# Patient Record
Sex: Female | Born: 1970 | Race: Black or African American | Hispanic: No | Smoking: Former smoker
Health system: Southern US, Community
[De-identification: ages and names within clinical notes are randomized; demographics above are authoritative.]

## PROBLEM LIST (undated history)

## (undated) DIAGNOSIS — E039 Hypothyroidism, unspecified: Secondary | ICD-10-CM

## (undated) DIAGNOSIS — G35 Multiple sclerosis: Secondary | ICD-10-CM

## (undated) DIAGNOSIS — D649 Anemia, unspecified: Secondary | ICD-10-CM

## (undated) DIAGNOSIS — I10 Essential (primary) hypertension: Secondary | ICD-10-CM

## (undated) DIAGNOSIS — N118 Other chronic tubulo-interstitial nephritis: Secondary | ICD-10-CM

## (undated) DIAGNOSIS — N151 Renal and perinephric abscess: Secondary | ICD-10-CM

## (undated) DIAGNOSIS — Z87442 Personal history of urinary calculi: Secondary | ICD-10-CM

## (undated) DIAGNOSIS — E119 Type 2 diabetes mellitus without complications: Secondary | ICD-10-CM

## (undated) HISTORY — PX: NO PAST SURGERIES: SHX2092

---

## 1997-05-31 ENCOUNTER — Emergency Department (HOSPITAL_COMMUNITY): Admission: EM | Admit: 1997-05-31 | Discharge: 1997-05-31 | Payer: Self-pay | Admitting: Emergency Medicine

## 1998-09-23 ENCOUNTER — Encounter: Payer: Self-pay | Admitting: Emergency Medicine

## 1998-09-23 ENCOUNTER — Emergency Department (HOSPITAL_COMMUNITY): Admission: EM | Admit: 1998-09-23 | Discharge: 1998-09-23 | Payer: Self-pay | Admitting: Emergency Medicine

## 1999-04-29 ENCOUNTER — Emergency Department (HOSPITAL_COMMUNITY): Admission: EM | Admit: 1999-04-29 | Discharge: 1999-04-29 | Payer: Self-pay | Admitting: Emergency Medicine

## 1999-04-29 ENCOUNTER — Encounter: Payer: Self-pay | Admitting: *Deleted

## 1999-08-23 ENCOUNTER — Emergency Department (HOSPITAL_COMMUNITY): Admission: EM | Admit: 1999-08-23 | Discharge: 1999-08-23 | Payer: Self-pay | Admitting: Emergency Medicine

## 1999-08-23 ENCOUNTER — Encounter: Payer: Self-pay | Admitting: Emergency Medicine

## 1999-11-18 ENCOUNTER — Emergency Department (HOSPITAL_COMMUNITY): Admission: EM | Admit: 1999-11-18 | Discharge: 1999-11-18 | Payer: Self-pay | Admitting: Emergency Medicine

## 1999-11-24 ENCOUNTER — Emergency Department (HOSPITAL_COMMUNITY): Admission: EM | Admit: 1999-11-24 | Discharge: 1999-11-24 | Payer: Self-pay | Admitting: Emergency Medicine

## 1999-12-18 ENCOUNTER — Emergency Department (HOSPITAL_COMMUNITY): Admission: EM | Admit: 1999-12-18 | Discharge: 1999-12-18 | Payer: Self-pay | Admitting: *Deleted

## 2000-08-29 ENCOUNTER — Encounter: Payer: Self-pay | Admitting: Obstetrics

## 2000-08-29 ENCOUNTER — Inpatient Hospital Stay (HOSPITAL_COMMUNITY): Admission: AD | Admit: 2000-08-29 | Discharge: 2000-08-29 | Payer: Self-pay | Admitting: Obstetrics

## 2000-09-17 ENCOUNTER — Inpatient Hospital Stay (HOSPITAL_COMMUNITY): Admission: AD | Admit: 2000-09-17 | Discharge: 2000-09-17 | Payer: Self-pay | Admitting: *Deleted

## 2000-10-13 ENCOUNTER — Inpatient Hospital Stay (HOSPITAL_COMMUNITY): Admission: AD | Admit: 2000-10-13 | Discharge: 2000-10-13 | Payer: Self-pay | Admitting: *Deleted

## 2000-11-24 ENCOUNTER — Ambulatory Visit (HOSPITAL_COMMUNITY): Admission: RE | Admit: 2000-11-24 | Discharge: 2000-11-24 | Payer: Self-pay | Admitting: Obstetrics & Gynecology

## 2001-02-24 ENCOUNTER — Inpatient Hospital Stay (HOSPITAL_COMMUNITY): Admission: AD | Admit: 2001-02-24 | Discharge: 2001-02-24 | Payer: Self-pay | Admitting: *Deleted

## 2001-04-11 ENCOUNTER — Encounter (HOSPITAL_COMMUNITY): Admission: RE | Admit: 2001-04-11 | Discharge: 2001-04-14 | Payer: Self-pay | Admitting: *Deleted

## 2001-04-17 ENCOUNTER — Encounter (INDEPENDENT_AMBULATORY_CARE_PROVIDER_SITE_OTHER): Payer: Self-pay

## 2001-04-17 ENCOUNTER — Inpatient Hospital Stay (HOSPITAL_COMMUNITY): Admission: AD | Admit: 2001-04-17 | Discharge: 2001-04-21 | Payer: Self-pay | Admitting: *Deleted

## 2001-05-05 ENCOUNTER — Encounter: Admission: RE | Admit: 2001-05-05 | Discharge: 2001-05-05 | Payer: Self-pay | Admitting: *Deleted

## 2001-05-22 ENCOUNTER — Ambulatory Visit (HOSPITAL_COMMUNITY): Admission: RE | Admit: 2001-05-22 | Discharge: 2001-05-22 | Payer: Self-pay | Admitting: *Deleted

## 2001-10-16 ENCOUNTER — Encounter: Payer: Self-pay | Admitting: Emergency Medicine

## 2001-10-16 ENCOUNTER — Emergency Department (HOSPITAL_COMMUNITY): Admission: EM | Admit: 2001-10-16 | Discharge: 2001-10-16 | Payer: Self-pay | Admitting: Emergency Medicine

## 2002-04-12 ENCOUNTER — Encounter: Admission: RE | Admit: 2002-04-12 | Discharge: 2002-07-11 | Payer: Self-pay | Admitting: Neurology

## 2002-12-12 ENCOUNTER — Encounter: Payer: Self-pay | Admitting: Cardiovascular Disease

## 2002-12-12 ENCOUNTER — Ambulatory Visit (HOSPITAL_COMMUNITY): Admission: RE | Admit: 2002-12-12 | Discharge: 2002-12-12 | Payer: Self-pay | Admitting: Neurology

## 2003-01-15 ENCOUNTER — Encounter (HOSPITAL_COMMUNITY): Admission: RE | Admit: 2003-01-15 | Discharge: 2003-04-15 | Payer: Self-pay | Admitting: Neurology

## 2003-02-14 ENCOUNTER — Encounter: Admission: RE | Admit: 2003-02-14 | Discharge: 2003-05-15 | Payer: Self-pay | Admitting: Neurology

## 2003-04-16 ENCOUNTER — Encounter (HOSPITAL_COMMUNITY): Admission: RE | Admit: 2003-04-16 | Discharge: 2003-07-15 | Payer: Self-pay | Admitting: Neurology

## 2003-07-11 ENCOUNTER — Ambulatory Visit (HOSPITAL_COMMUNITY): Admission: RE | Admit: 2003-07-11 | Discharge: 2003-07-11 | Payer: Self-pay | Admitting: Neurology

## 2003-07-11 ENCOUNTER — Encounter (INDEPENDENT_AMBULATORY_CARE_PROVIDER_SITE_OTHER): Payer: Self-pay | Admitting: Cardiology

## 2003-07-22 ENCOUNTER — Encounter (HOSPITAL_COMMUNITY): Admission: RE | Admit: 2003-07-22 | Discharge: 2003-08-05 | Payer: Self-pay | Admitting: Neurology

## 2003-09-11 ENCOUNTER — Encounter (HOSPITAL_COMMUNITY): Admission: RE | Admit: 2003-09-11 | Discharge: 2003-12-10 | Payer: Self-pay | Admitting: Neurology

## 2003-10-22 ENCOUNTER — Ambulatory Visit: Payer: Self-pay | Admitting: Cardiology

## 2004-01-27 ENCOUNTER — Encounter (HOSPITAL_COMMUNITY): Admission: RE | Admit: 2004-01-27 | Discharge: 2004-02-08 | Payer: Self-pay | Admitting: Neurology

## 2004-02-03 ENCOUNTER — Emergency Department (HOSPITAL_COMMUNITY): Admission: EM | Admit: 2004-02-03 | Discharge: 2004-02-04 | Payer: Self-pay | Admitting: Emergency Medicine

## 2004-02-12 ENCOUNTER — Encounter (HOSPITAL_COMMUNITY): Admission: RE | Admit: 2004-02-12 | Discharge: 2004-05-12 | Payer: Self-pay | Admitting: Neurology

## 2004-03-05 ENCOUNTER — Ambulatory Visit: Payer: Self-pay | Admitting: Internal Medicine

## 2004-04-23 ENCOUNTER — Ambulatory Visit: Payer: Self-pay

## 2004-04-28 ENCOUNTER — Ambulatory Visit: Payer: Self-pay | Admitting: Internal Medicine

## 2004-07-23 ENCOUNTER — Encounter (HOSPITAL_COMMUNITY): Admission: RE | Admit: 2004-07-23 | Discharge: 2004-10-21 | Payer: Self-pay | Admitting: Neurology

## 2004-07-28 ENCOUNTER — Ambulatory Visit: Payer: Self-pay | Admitting: Internal Medicine

## 2004-07-31 ENCOUNTER — Ambulatory Visit: Payer: Self-pay

## 2004-11-02 ENCOUNTER — Ambulatory Visit: Payer: Self-pay

## 2004-11-05 ENCOUNTER — Encounter (HOSPITAL_COMMUNITY): Admission: RE | Admit: 2004-11-05 | Discharge: 2005-02-03 | Payer: Self-pay | Admitting: Neurology

## 2005-02-10 ENCOUNTER — Ambulatory Visit: Payer: Self-pay

## 2005-02-10 ENCOUNTER — Encounter: Payer: Self-pay | Admitting: Cardiovascular Disease

## 2005-02-16 ENCOUNTER — Encounter (HOSPITAL_COMMUNITY): Admission: RE | Admit: 2005-02-16 | Discharge: 2005-05-17 | Payer: Self-pay | Admitting: Neurology

## 2005-04-28 ENCOUNTER — Ambulatory Visit: Payer: Self-pay | Admitting: Internal Medicine

## 2005-05-10 ENCOUNTER — Ambulatory Visit: Payer: Self-pay

## 2005-05-10 ENCOUNTER — Encounter: Payer: Self-pay | Admitting: Internal Medicine

## 2005-07-06 ENCOUNTER — Ambulatory Visit: Payer: Self-pay | Admitting: Internal Medicine

## 2005-08-10 ENCOUNTER — Ambulatory Visit: Payer: Self-pay

## 2005-08-10 ENCOUNTER — Encounter: Payer: Self-pay | Admitting: Cardiology

## 2005-08-17 ENCOUNTER — Encounter (HOSPITAL_COMMUNITY): Admission: RE | Admit: 2005-08-17 | Discharge: 2005-11-03 | Payer: Self-pay | Admitting: Neurology

## 2005-09-21 ENCOUNTER — Ambulatory Visit: Payer: Self-pay | Admitting: Internal Medicine

## 2005-11-16 ENCOUNTER — Encounter: Payer: Self-pay | Admitting: Cardiology

## 2005-11-16 ENCOUNTER — Ambulatory Visit: Payer: Self-pay

## 2005-11-22 ENCOUNTER — Encounter (HOSPITAL_COMMUNITY): Admission: RE | Admit: 2005-11-22 | Discharge: 2006-02-03 | Payer: Self-pay | Admitting: Neurology

## 2006-03-04 ENCOUNTER — Ambulatory Visit: Payer: Self-pay | Admitting: Internal Medicine

## 2008-03-29 ENCOUNTER — Ambulatory Visit (HOSPITAL_BASED_OUTPATIENT_CLINIC_OR_DEPARTMENT_OTHER): Admission: RE | Admit: 2008-03-29 | Discharge: 2008-03-29 | Payer: Self-pay | Admitting: Otolaryngology

## 2008-03-29 ENCOUNTER — Encounter (INDEPENDENT_AMBULATORY_CARE_PROVIDER_SITE_OTHER): Payer: Self-pay | Admitting: Otolaryngology

## 2009-01-27 ENCOUNTER — Emergency Department (HOSPITAL_COMMUNITY): Admission: EM | Admit: 2009-01-27 | Discharge: 2009-01-27 | Payer: Self-pay | Admitting: Emergency Medicine

## 2010-05-11 LAB — DIFFERENTIAL
Basophils Absolute: 0 10*3/uL (ref 0.0–0.1)
Lymphs Abs: 1.9 10*3/uL (ref 0.7–4.0)
Monocytes Absolute: 0.6 10*3/uL (ref 0.1–1.0)
Monocytes Relative: 6 % (ref 3–12)
Neutro Abs: 8.1 10*3/uL — ABNORMAL HIGH (ref 1.7–7.7)

## 2010-05-11 LAB — CBC
HCT: 28.9 % — ABNORMAL LOW (ref 36.0–46.0)
Hemoglobin: 8.7 g/dL — ABNORMAL LOW (ref 12.0–15.0)
MCHC: 30.3 g/dL (ref 30.0–36.0)
MCV: 65.4 fL — ABNORMAL LOW (ref 78.0–100.0)
Platelets: 253 10*3/uL (ref 150–400)
RBC: 4.42 MIL/uL (ref 3.87–5.11)
RDW: 22 % — ABNORMAL HIGH (ref 11.5–15.5)
WBC: 10.8 10*3/uL — ABNORMAL HIGH (ref 4.0–10.5)

## 2010-05-11 LAB — POCT I-STAT, CHEM 8
BUN: 11 mg/dL (ref 6–23)
Calcium, Ion: 1.18 mmol/L (ref 1.12–1.32)
Chloride: 106 mEq/L (ref 96–112)
Glucose, Bld: 112 mg/dL — ABNORMAL HIGH (ref 70–99)
HCT: 31 % — ABNORMAL LOW (ref 36.0–46.0)
TCO2: 24 mmol/L (ref 0–100)

## 2010-06-23 NOTE — Op Note (Signed)
NAMEOCEANE, FOSSE               ACCOUNT NO.:  [id number]   MEDICAL RECORD NO.:  [id number]          PATIENT TYPE:  AMB   LOCATION:  DSC                          FACILITY:  [facility]   PHYSICIAN:  Christopher E. Ezzard Standing, M.D.DATE OF BIRTH:  05/30/1970   DATE OF PROCEDURE:  03/29/2008  DATE OF DISCHARGE:                               OPERATIVE REPORT   PREOPERATIVE DIAGNOSIS:  Right tongue mass with recurrent bleeding.   POSTOPERATIVE DIAGNOSIS:  Right tongue mass with recurrent bleeding.   FINDINGS:  Consistent with a large pyogenic granuloma.   OPERATION PERFORMED:  Excision of right anterior tongue mass with simple  closure.   SURGEON:  Kristine Garbe. Ezzard Standing, MD   ANESTHESIA:  Local, Xylocaine with epinephrine.   COMPLICATIONS:  None.   BRIEF CLINICAL NOTE:  [place] Oborn is a 40 year old female with history  of a mass.  She has had intermittent biting of her tongue and has  developed a mass on the right anterior tongue.  It measures  approximately 2 cm in size and bleeds fairly easily and is consistent  with probable pyogenic granuloma.  Because she intermittently bites  this, she is taken to the operating room at this time for excision of  right anterior tongue mass.   DESCRIPTION OF PROCEDURE:  The patient was brought to the Minor Room.  The tongue was injected with 3 mL of Xylocaine with epinephrine for  local anesthetic.  The lesion was removed with scissors.  Silver nitrate  was used for hemostasis at the base and the small defect was closed with  2 interrupted 4-0 chromic sutures.  Specimen was sent to pathology.  The  patient as well tolerated this well and subsequently discharged home.   DISPOSITION:  Jennings is discharged home on Tylenol p.r.n. pain and  topical anesthetic for pain and we will have her follow up in my office  in 10-14 days for recheck.           ______________________________  Kristine Garbe. Ezzard Standing, M.D.     CEN/MEDQ  D:  03/29/2008  T:   03/29/2008  Job:  [id number]   cc:   Florentina Jenny, MD

## 2010-06-26 NOTE — Consult Note (Signed)
NAME:  Jacqueline Norman, DOWE                         ACCOUNT NO.:  [id number]   MEDICAL RECORD NO.:  [id number]                   PATIENT TYPE:  EMS   LOCATION:  MAJO                                 FACILITY:  [facility]   PHYSICIAN:  Catherine A. Orlin Hilding, M.D.          DATE OF BIRTH:  05/09/1970   DATE OF CONSULTATION:  DATE OF DISCHARGE:                                   CONSULTATION   CHIEF COMPLAINT:  Dizziness and dysequilibrium.   HISTORY OF PRESENT ILLNESS:  The patient is a 40 year old, right-handed  woman with a history of about three days' worth of intermittent mild  dizziness, dysequilibrium, unsteady walking, and she also complained of  intermittent diagonal diplopia.  There is no significant history of nausea  or vomiting.  She has some mild intermittent left-sided weakness, no  numbness.   REVIEW OF SYSTEMS:  Notable for headache.  She does have some urinary  urgency and fatigue.  No history of symptoms consistent with optic neuritis.  I do not get any history of symptoms of numbness from the waist down or the  chest down, to suggest transverse myelitis, but there is some urinary  urgency which could suggest that in the past.  No symptoms of INO or optic  neuritis in the past.   PAST MEDICAL HISTORY:  Significant for questionable borderline hypertension.  She says she has never had diabetes mellitus.  She has mild obesity.   SOCIAL HISTORY:  She has five children ranging in age from 58 years to 69 old, placing her at about age 17 at the birth of her first child.  She is unmarried.  Lives with her five children ages 56 years to five  months.  She works as a Lawyer.  She smokes 1/2 pack of cigarettes a day.  Occasional caffeine use.  No alcohol use.   MEDICATIONS:  She takes none routinely.   ALLERGIES:  No known drug allergies.   FAMILY HISTORY:  Positive for hypertension.  No history of multiple  sclerosis.   PHYSICAL EXAMINATION:  VITAL SIGNS:  Temperature 98.0  degrees, blood  pressure 120/78, pulse 64, respirations 18, 99% saturation.  HEENT:  Head normocephalic, atraumatic.  NECK:  Supple.  NEUROLOGIC:  Mental status:  She is awake and alert with normal language and  cognition.  Cranial nerve II:  Pupils equal, reactive, but could not see her  disc margins well.  Visual fields are full to confrontation.  Extraocular  movements are intact without nystagmus.  There is a slight dysconjugate  appearance, but I cannot elicit anything that looks like an intranuclear  ophthalmoplegia, and I cannot elicit any subjective diplopia from her.  Facial sensation is normal.  Facial motor activity is normal without  weakness or asymmetry.  Hearing is intact.  Palate is symmetric, and tongue  is midline.  There is no dysarthria.  On motor exam there is no drift.  She  has normal rapid fine movement, minimal satelliting of the left arm on arm  roll.  She, however, has good grips, good bulk, tone, and strength  throughout.  Her gait is mildly wide-based and slightly ataxic.  Her  reflexes, however, are 2+ and symmetric.  There is really not a lot of  asymmetry.  She has downgoing toes to plantar stimulation bilaterally.  Finger-to-nose and heel-to-shin are normal.  She is ataxic and somewhat  wobbly on tandem gait.   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  No lesions  enhanced on the administration of Gadolinium, however, and nothing looks  hyperacute.  A CBC is 100.  A urine pregnancy test is negative.   IMPRESSION:  Multiple sclerosis, clinically probable with laboratory  support.  Looks fairly advanced by imaging, although she has a relatively  benign exam, considering the burden of disease on the scan.   PLAN:  1. Prednisone double strength 10 mg, a 12-day Dosepak.  2. Pepcid 20 mg  b.i.d. for the duration of the steroids.  3. Get her a cane for ambulation.  4. Keep her out of work for at least one week, until she feels like she is     walking steadily.  5. Will follow up in the office in about two weeks, with my physician's     assistant, Dorris Singh, for further education and discussion of an     immunomodulatory medication and further treatment of her multiple     sclerosis.                                               Catherine A. Orlin Hilding, M.D.    CAW/MEDQ  D:  10/16/2001  T:  10/17/2001  Job:  [id number] [phone]

## 2010-06-26 NOTE — Op Note (Signed)
[facility]  Patient:    Jacqueline Norman, Jacqueline Norman Visit Number: [id number] MRN: [id number]          Service Type: DSU Location: [facility] Attending Physician:  Enid Cutter Dictated by:   Shearon Balo, M.D. Proc. Date: 05/22/01 Admit Date:  05/22/2001                             Operative Report  PREOPERATIVE DIAGNOSIS:       A 40 year old G5, P5 black female with undesired fertility.  POSTOPERATIVE DIAGNOSIS:      A 40 year old G5, P5 black female with undesired fertility.  PROCEDURE:                    Laparoscopic bilateral tubal ligation, with _____________.  SURGEON:                      Shearon Balo, M.D.  ANESTHESIA:                   General endotracheal.  COMPLICATIONS:                None.  SPECIMENS:                    None.  ESTIMATED BLOOD LOSS:         Minimal.  DISPOSITION:                  To recovery room, stable.  FINDINGS:                     Normal uterus, tubes and ovaries.  DESCRIPTION OF PROCEDURE:     The patient was taken to the operating room, where she was placed under general endotracheal anesthesia.  She was placed in the Allens stirrups and prepped and draped in sterile fashion.  A speculum was placed in the vagina and a Hulka tenaculum was introduced into the uterine cavity.  The attention was then turned to the abdomen, where an incision was made to the umbilicus.  A Veress needle was inserted and the abdomen was insufflated with approximately 300 L of CO2 gas.  A umbilical 5 mm trocar was introduced and visualization of the pelvic cavity was performed.  A second 8 mm suprapubic incision was performed, and an 8 mm trocar introduced. _____________ applicator was then introduced into the abdominal cavity and the patients right fallopian tube was identified to the fimbriated end.  The _____________ was applied approximately 3 cm for the cornua.  Appropriate splitting and blanching of the tube was noted.  The patients left  fallopian tube was identified in a similar fashion.  Again, a _____________ was applied without difficulty, about 2-3 cm from the cornua.  Both tubes were hemostatic. The trocars were removed under direct visualization and CO2 gas was emptied from the abdominal cavity.  The umbilical and suprapubic incisions were reapproximated with a subcuticular stitch of 3-0 Vicryl.  The patient tolerated the procedure well and was awakened and returned to the recovery room in stable condition. Dictated by:   Shearon Balo, M.D. Attending Physician:  Enid Cutter DD:  05/22/01 TD:  05/22/01 Job: [id number]

## 2010-06-26 NOTE — Op Note (Signed)
[facility]  Patient:    Jacqueline Norman, Jacqueline Norman Visit Number: [id number] MRN: [id number]          Service Type: OBS Location: 910A 9119 01 Attending Physician:  Enid Cutter Dictated by:   Conni Elliot, M.D. Admit Date:  04/17/2001                             Operative Report  PREOPERATIVE DIAGNOSIS:       Retained placenta with hemorrhage.  POSTOPERATIVE DIAGNOSIS:      Retained placenta with hemorrhage.  SURGEON:                      Conni Elliot, M.D.  DESCRIPTION OF PROCEDURE:     The patient was taken to the operating room where a spinal anesthetic was placed.  Upon placing the patient supine in the dorsolithotomy position, the placenta had spontaneously delivered down to the introitus.  The uterus was explored manually and was found to have no further retained products, and the lower segment was intact.  The estimated blood loss was less than 200 cc.  Needle, instrument, and sponge counts were correct. Dictated by:   Conni Elliot, M.D. Attending Physician:  Enid Cutter DD:  04/19/01 TD:  04/19/01 Job: [id number]

## 2010-06-26 NOTE — H&P (Signed)
[facility]  Patient:    Jacqueline Norman, Jacqueline Norman Visit Number: [id number] MRN: [id number]          Service Type: OBS Location: 910A 9119 01 Attending Physician:  Enid Cutter Dictated by:   Shearon Balo, M.D. Admit Date:  04/17/2001 Discharge Date: 04/21/2001   CC:         [place]   History and Physical  HISTORY OF PRESENT ILLNESS:   The patient is a 40 year old, G5, P5, black female, status post spontaneous vaginal delivery on April 19, 2001.  She has undesired fertility and presents to the clinic today for counseling on tubal ligation.  The patient was counseled on alternative forms of conception, including barrier, IUD, and hormonal contraception.  The patient says that she desires permanent sterilization.  The risks of surgery were discussed with the patient, including the risk of bleeding, infection, and injury to internal organs.  The risk of failure of 1:200 and the increased risk of ectopic gestation were discussed as well.  The patient understands these risks and wishes to proceed.  PAST MEDICAL HISTORY:         The patient has a history of low back pain.  PAST SURGICAL HISTORY:        She has had C-section x 1 for fetal distress.  FAMILY HISTORY:               Her father is deceased of throat cancer, otherwise noncontributory.  MEDICATIONS:                  Ibuprofen.  ALLERGIES:                    No known drug allergies.  PHYSICAL EXAMINATION:         The pulse is 59 and blood pressure 149/85.  WEIGHT:                       238 pounds.  HEENT:                        Normocephalic and atraumatic.  HEART:                        Regular rate and rhythm without murmurs, rubs, or gallops.  LUNGS:                        Clear to auscultation bilaterally.  ABDOMEN:                      Soft, nondistended, and moderately obese.  There is a well-healed Pfannenstiel C-section incision.  No  hepatosplenomegaly is noted.  No other abdominal masses noted.  PELVIC:                       Bimanual exam reveals approximately 12-week size uterus and nontender.  No adnexal masses were noted.  EXTREMITIES:                  Without clubbing, cyanosis, or edema.  ASSESSMENT AND PLAN:          The patient is a 40 year old, G5, P5, black female, status post spontaneous vaginal delivery on April 19, 2001, who desires permanent sterilization.  The patient is to be scheduled for interval tubal with laparoscopic tubal ligation by  Falope rings.  We will schedule this in mid April prior to the expiration of the patients Medicaid benefits. Dictated by:   Shearon Balo, M.D. Attending Physician:  Enid Cutter DD:  05/05/01 TD:  05/05/01 Job: [id number]

## 2013-07-29 ENCOUNTER — Encounter (HOSPITAL_COMMUNITY): Payer: Self-pay | Admitting: Emergency Medicine

## 2013-07-29 ENCOUNTER — Emergency Department (HOSPITAL_COMMUNITY): Payer: Medicare Other

## 2013-07-29 ENCOUNTER — Inpatient Hospital Stay (HOSPITAL_COMMUNITY)
Admission: EM | Admit: 2013-07-29 | Discharge: 2013-08-05 | DRG: 871 | Disposition: A | Discharge status: Home Health | Payer: Medicare Other | Attending: Internal Medicine | Admitting: Internal Medicine

## 2013-07-29 DIAGNOSIS — G35 Multiple sclerosis: Secondary | ICD-10-CM | POA: Diagnosis present

## 2013-07-29 DIAGNOSIS — K6812 Psoas muscle abscess: Secondary | ICD-10-CM | POA: Diagnosis present

## 2013-07-29 DIAGNOSIS — E876 Hypokalemia: Secondary | ICD-10-CM | POA: Diagnosis present

## 2013-07-29 DIAGNOSIS — R197 Diarrhea, unspecified: Secondary | ICD-10-CM | POA: Diagnosis not present

## 2013-07-29 DIAGNOSIS — N1330 Unspecified hydronephrosis: Secondary | ICD-10-CM | POA: Diagnosis present

## 2013-07-29 DIAGNOSIS — D509 Iron deficiency anemia, unspecified: Secondary | ICD-10-CM | POA: Diagnosis present

## 2013-07-29 DIAGNOSIS — N201 Calculus of ureter: Secondary | ICD-10-CM | POA: Diagnosis present

## 2013-07-29 DIAGNOSIS — A419 Sepsis, unspecified organism: Principal | ICD-10-CM | POA: Diagnosis present

## 2013-07-29 DIAGNOSIS — G8250 Quadriplegia, unspecified: Secondary | ICD-10-CM | POA: Diagnosis present

## 2013-07-29 DIAGNOSIS — Z79899 Other long term (current) drug therapy: Secondary | ICD-10-CM

## 2013-07-29 DIAGNOSIS — N200 Calculus of kidney: Secondary | ICD-10-CM | POA: Diagnosis present

## 2013-07-29 DIAGNOSIS — N118 Other chronic tubulo-interstitial nephritis: Secondary | ICD-10-CM | POA: Diagnosis present

## 2013-07-29 DIAGNOSIS — N12 Tubulo-interstitial nephritis, not specified as acute or chronic: Secondary | ICD-10-CM | POA: Diagnosis present

## 2013-07-29 DIAGNOSIS — Z7401 Bed confinement status: Secondary | ICD-10-CM

## 2013-07-29 DIAGNOSIS — Z87891 Personal history of nicotine dependence: Secondary | ICD-10-CM

## 2013-07-29 DIAGNOSIS — K5900 Constipation, unspecified: Secondary | ICD-10-CM | POA: Diagnosis present

## 2013-07-29 DIAGNOSIS — N151 Renal and perinephric abscess: Secondary | ICD-10-CM | POA: Diagnosis present

## 2013-07-29 DIAGNOSIS — N119 Chronic tubulo-interstitial nephritis, unspecified: Secondary | ICD-10-CM | POA: Diagnosis present

## 2013-07-29 HISTORY — DX: Multiple sclerosis: G35

## 2013-07-29 LAB — URINALYSIS, ROUTINE W REFLEX MICROSCOPIC
Glucose, UA: NEGATIVE mg/dL
Ketones, ur: NEGATIVE mg/dL
Nitrite: POSITIVE — AB
PROTEIN: 100 mg/dL — AB
Specific Gravity, Urine: 1.026 (ref 1.005–1.030)
UROBILINOGEN UA: 1 mg/dL (ref 0.0–1.0)
pH: 5 (ref 5.0–8.0)

## 2013-07-29 LAB — CBC WITH DIFFERENTIAL/PLATELET
BASOS PCT: 0 % (ref 0–1)
Basophils Absolute: 0 10*3/uL (ref 0.0–0.1)
EOS PCT: 2 % (ref 0–5)
Eosinophils Absolute: 0.2 10*3/uL (ref 0.0–0.7)
HCT: 26.2 % — ABNORMAL LOW (ref 36.0–46.0)
HEMOGLOBIN: 7.5 g/dL — AB (ref 12.0–15.0)
LYMPHS PCT: 27 % (ref 12–46)
Lymphs Abs: 2.7 10*3/uL (ref 0.7–4.0)
MCH: 19.7 pg — AB (ref 26.0–34.0)
MCHC: 28.6 g/dL — ABNORMAL LOW (ref 30.0–36.0)
MCV: 68.9 fL — ABNORMAL LOW (ref 78.0–100.0)
MONO ABS: 0.8 10*3/uL (ref 0.1–1.0)
MONOS PCT: 8 % (ref 3–12)
NEUTROS PCT: 63 % (ref 43–77)
Neutro Abs: 6.3 10*3/uL (ref 1.7–7.7)
Platelets: 429 10*3/uL — ABNORMAL HIGH (ref 150–400)
RBC: 3.8 MIL/uL — AB (ref 3.87–5.11)
RDW: 19.5 % — ABNORMAL HIGH (ref 11.5–15.5)
WBC: 10 10*3/uL (ref 4.0–10.5)

## 2013-07-29 LAB — COMPREHENSIVE METABOLIC PANEL
ALK PHOS: 83 U/L (ref 39–117)
ALT: 9 U/L (ref 0–35)
AST: 20 U/L (ref 0–37)
Albumin: 2.1 g/dL — ABNORMAL LOW (ref 3.5–5.2)
BILIRUBIN TOTAL: 0.6 mg/dL (ref 0.3–1.2)
BUN: 9 mg/dL (ref 6–23)
CHLORIDE: 97 meq/L (ref 96–112)
CO2: 25 meq/L (ref 19–32)
Calcium: 9 mg/dL (ref 8.4–10.5)
Creatinine, Ser: 0.63 mg/dL (ref 0.50–1.10)
GLUCOSE: 118 mg/dL — AB (ref 70–99)
POTASSIUM: 3.4 meq/L — AB (ref 3.7–5.3)
SODIUM: 134 meq/L — AB (ref 137–147)
Total Protein: 9.4 g/dL — ABNORMAL HIGH (ref 6.0–8.3)

## 2013-07-29 LAB — PREGNANCY, URINE: Preg Test, Ur: NEGATIVE

## 2013-07-29 LAB — APTT: APTT: 26 s (ref 24–37)

## 2013-07-29 LAB — PROTIME-INR
INR: 1.22 (ref 0.00–1.49)
PROTHROMBIN TIME: 15.1 s (ref 11.6–15.2)

## 2013-07-29 LAB — URINE MICROSCOPIC-ADD ON

## 2013-07-29 LAB — LIPASE, BLOOD: Lipase: 24 U/L (ref 11–59)

## 2013-07-29 LAB — LACTATE DEHYDROGENASE: LDH: 157 U/L (ref 94–250)

## 2013-07-29 LAB — RETICULOCYTES
RBC.: 4.25 MIL/uL (ref 3.87–5.11)
RETIC COUNT ABSOLUTE: 76.5 10*3/uL (ref 19.0–186.0)
Retic Ct Pct: 1.8 % (ref 0.4–3.1)

## 2013-07-29 LAB — ABO/RH: ABO/RH(D): O POS

## 2013-07-29 MED ORDER — MORPHINE SULFATE 2 MG/ML IJ SOLN
2.0000 mg | INTRAMUSCULAR | Status: DC | PRN
  Administered 2013-07-30 (×3): 2 mg via INTRAVENOUS
  Filled 2013-07-29 (×3): qty 1

## 2013-07-29 MED ORDER — IOHEXOL 300 MG/ML  SOLN
50.0000 mL | Freq: Once | INTRAMUSCULAR | Status: AC | PRN
  Administered 2013-07-29: 50 mL via ORAL

## 2013-07-29 MED ORDER — ACETAMINOPHEN 650 MG RE SUPP
650.0000 mg | Freq: Once | RECTAL | Status: AC
  Administered 2013-07-29: 650 mg via RECTAL
  Filled 2013-07-29: qty 1

## 2013-07-29 MED ORDER — ONDANSETRON HCL 4 MG/2ML IJ SOLN
4.0000 mg | Freq: Once | INTRAMUSCULAR | Status: AC
  Administered 2013-07-29: 4 mg via INTRAVENOUS
  Filled 2013-07-29: qty 2

## 2013-07-29 MED ORDER — POTASSIUM CHLORIDE 10 MEQ/100ML IV SOLN
10.0000 meq | INTRAVENOUS | Status: AC
  Administered 2013-07-29 (×2): 10 meq via INTRAVENOUS
  Filled 2013-07-29 (×2): qty 100

## 2013-07-29 MED ORDER — SODIUM CHLORIDE 0.9 % IV SOLN
INTRAVENOUS | Status: DC
  Administered 2013-07-30 – 2013-08-05 (×11): via INTRAVENOUS

## 2013-07-29 MED ORDER — SODIUM CHLORIDE 0.9 % IV BOLUS (SEPSIS)
500.0000 mL | Freq: Once | INTRAVENOUS | Status: AC
  Administered 2013-07-29: 500 mL via INTRAVENOUS

## 2013-07-29 MED ORDER — LEVOTHYROXINE SODIUM 50 MCG PO TABS
50.0000 ug | ORAL_TABLET | Freq: Every day | ORAL | Status: DC
  Administered 2013-07-31 – 2013-08-05 (×6): 50 ug via ORAL
  Filled 2013-07-29 (×7): qty 1

## 2013-07-29 MED ORDER — VANCOMYCIN HCL IN DEXTROSE 750-5 MG/150ML-% IV SOLN
750.0000 mg | Freq: Three times a day (TID) | INTRAVENOUS | Status: DC
  Administered 2013-07-30 – 2013-07-31 (×5): 750 mg via INTRAVENOUS
  Filled 2013-07-29 (×6): qty 150

## 2013-07-29 MED ORDER — SODIUM CHLORIDE 0.9 % IV BOLUS (SEPSIS)
1000.0000 mL | Freq: Once | INTRAVENOUS | Status: AC
  Administered 2013-07-29: 1000 mL via INTRAVENOUS

## 2013-07-29 MED ORDER — IOHEXOL 300 MG/ML  SOLN
100.0000 mL | Freq: Once | INTRAMUSCULAR | Status: AC | PRN
  Administered 2013-07-29: 100 mL via INTRAVENOUS

## 2013-07-29 MED ORDER — BENZTROPINE MESYLATE 1 MG PO TABS
1.0000 mg | ORAL_TABLET | Freq: Two times a day (BID) | ORAL | Status: DC
  Administered 2013-07-30 – 2013-08-05 (×13): 1 mg via ORAL
  Filled 2013-07-29: qty 2
  Filled 2013-07-29: qty 1
  Filled 2013-07-29: qty 2
  Filled 2013-07-29 (×5): qty 1
  Filled 2013-07-29: qty 2
  Filled 2013-07-29: qty 1
  Filled 2013-07-29: qty 2
  Filled 2013-07-29: qty 1
  Filled 2013-07-29: qty 2
  Filled 2013-07-29: qty 1

## 2013-07-29 MED ORDER — DOCUSATE SODIUM 100 MG PO CAPS: 100.0000 mg | ORAL_CAPSULE | Freq: Every day | ORAL | Status: DC | PRN

## 2013-07-29 MED ORDER — ONDANSETRON HCL 4 MG/2ML IJ SOLN
4.0000 mg | Freq: Four times a day (QID) | INTRAMUSCULAR | Status: DC | PRN
  Administered 2013-08-01 – 2013-08-02 (×2): 4 mg via INTRAVENOUS
  Filled 2013-07-29 (×2): qty 2

## 2013-07-29 MED ORDER — VANCOMYCIN HCL IN DEXTROSE 1-5 GM/200ML-% IV SOLN
1000.0000 mg | INTRAVENOUS | Status: AC
  Administered 2013-07-29: 1000 mg via INTRAVENOUS
  Filled 2013-07-29 (×2): qty 200

## 2013-07-29 MED ORDER — POTASSIUM CHLORIDE 10 MEQ/100ML IV SOLN
10.0000 meq | INTRAVENOUS | Status: DC
  Administered 2013-07-29 (×2): 10 meq via INTRAVENOUS
  Filled 2013-07-29 (×2): qty 100

## 2013-07-29 MED ORDER — SODIUM CHLORIDE 0.9 % IV SOLN
INTRAVENOUS | Status: AC
  Administered 2013-07-29: 125 mL/h via INTRAVENOUS

## 2013-07-29 MED ORDER — PIPERACILLIN-TAZOBACTAM 3.375 G IVPB
3.3750 g | Freq: Three times a day (TID) | INTRAVENOUS | Status: DC
  Administered 2013-07-30 – 2013-08-04 (×17): 3.375 g via INTRAVENOUS
  Filled 2013-07-29 (×20): qty 50

## 2013-07-29 MED ORDER — HYDROMORPHONE HCL PF 1 MG/ML IJ SOLN
1.0000 mg | Freq: Once | INTRAMUSCULAR | Status: AC
  Administered 2013-07-29: 1 mg via INTRAVENOUS
  Filled 2013-07-29: qty 1

## 2013-07-29 MED ORDER — BACLOFEN 10 MG PO TABS
10.0000 mg | ORAL_TABLET | Freq: Two times a day (BID) | ORAL | Status: DC
  Administered 2013-07-29 – 2013-08-05 (×14): 10 mg via ORAL
  Filled 2013-07-29 (×15): qty 1

## 2013-07-29 MED ORDER — SODIUM CHLORIDE 0.9 % IJ SOLN
3.0000 mL | Freq: Two times a day (BID) | INTRAMUSCULAR | Status: DC
  Administered 2013-07-30 – 2013-08-03 (×7): 3 mL via INTRAVENOUS

## 2013-07-29 MED ORDER — PIPERACILLIN-TAZOBACTAM 3.375 G IVPB 30 MIN
3.3750 g | Freq: Once | INTRAVENOUS | Status: AC
  Administered 2013-07-29: 3.375 g via INTRAVENOUS
  Filled 2013-07-29: qty 50

## 2013-07-29 MED ORDER — DEXTROSE 5 % IV SOLN
1.0000 g | Freq: Once | INTRAVENOUS | Status: AC
  Administered 2013-07-29: 1 g via INTRAVENOUS
  Filled 2013-07-29: qty 10

## 2013-07-29 MED ORDER — ONDANSETRON HCL 4 MG PO TABS: 4.0000 mg | ORAL_TABLET | Freq: Four times a day (QID) | ORAL | Status: DC | PRN

## 2013-07-29 NOTE — Consult Note (Signed)
Urology Consult  Referring physician:Dr Regenia Skeeter Reason for referral: Right nephrolithiasis  Chief Complaint: Abdominal pain  History of Present Illness: Patient is a 43 years old female who presented to the emergency room this afternoon with 5 days history of increasing abdominal pain. She had one episode of vomiting about 3 days ago. She has a history of constipation and fecal impaction. She has a 12 years history of MS. She gets enemas as needed by her mother.  She states that she doesn't have any problems urinating. Urinalysis shows a 21-50 WBCs, many bacteria, calcium oxalate crystals, nitrite positive. Her white count is 10,000. Hemoglobin is 7.5 and hematocrit is 26.2. BUN is 9, creatinine 0.63. CT scan showed multiple right renal calculi and a 23 x 11 mm stone at the right UPJ with severe hydronephrosis. There is also a fluid collection in the perinephric area. Her blood pressure is 126/95. She did not have a temperature at home. Her temperature here is 102.1. She is now on IV Zosyn.  Past Medical History  Diagnosis Date  . MS (multiple sclerosis)    History reviewed. No pertinent past surgical history.  Medications: Baclofen, benztropine, vitamin D, Colace, interferon beta-1B, levothyroxine, omega-3 acid ethyl esters, potassium chloride. Allergies: No Known Allergies  No family history on file. Social History:  reports that she has quit smoking. She does not have any smokeless tobacco history on file. She reports that she does not drink alcohol or use illicit drugs.  ROS: All systems are reviewed and negative except as noted.   Physical Exam:  Vital signs in last 24 hours: Temp:  [98.5 F (36.9 C)-102.1 F (38.9 C)] 102.1 F (38.9 C) (06/21 1950) Pulse Rate:  [107-113] 113 (06/21 1950) Resp:  [17] 17 (06/21 1950) BP: (126-128)/(78-95) 126/95 mmHg (06/21 1950) SpO2:  [94 %-98 %] 94 % (06/21 1950) HEENT: Normal Cardiovascular: Skin warm; not flushed Respiratory: Breaths  quiet; no shortness of breath Abdomen: No masses Neurological: Decreased sensation extremities. Unable to move extremities.  Musculoskeletal: Normal motor function arms and legs Lymphatics: No inguinal adenopathy Skin: No rashes.  Warm and dry Genitourinary: No CVA tenderness.  Generalized abdominal tenderness.  No mass. Extremities: She has no lower extremities edema. She is unable to move her extremities.  Laboratory Data:  Results for orders placed during the hospital encounter of 07/29/13 (from the past 72 hour(s))  CBC WITH DIFFERENTIAL     Status: Abnormal   Collection Time    07/29/13  4:23 PM      Result Value Ref Range   WBC 10.0  4.0 - 10.5 K/uL   RBC 3.80 (*) 3.87 - 5.11 MIL/uL   Hemoglobin 7.5 (*) 12.0 - 15.0 g/dL   HCT 26.2 (*) 36.0 - 46.0 %   MCV 68.9 (*) 78.0 - 100.0 fL   MCH 19.7 (*) 26.0 - 34.0 pg   MCHC 28.6 (*) 30.0 - 36.0 g/dL   RDW 19.5 (*) 11.5 - 15.5 %   Platelets 429 (*) 150 - 400 K/uL   Neutrophils Relative % 63  43 - 77 %   Lymphocytes Relative 27  12 - 46 %   Monocytes Relative 8  3 - 12 %   Eosinophils Relative 2  0 - 5 %   Basophils Relative 0  0 - 1 %   Neutro Abs 6.3  1.7 - 7.7 K/uL   Lymphs Abs 2.7  0.7 - 4.0 K/uL   Monocytes Absolute 0.8  0.1 - 1.0 K/uL  Eosinophils Absolute 0.2  0.0 - 0.7 K/uL   Basophils Absolute 0.0  0.0 - 0.1 K/uL   Smear Review MORPHOLOGY UNREMARKABLE    COMPREHENSIVE METABOLIC PANEL     Status: Abnormal   Collection Time    07/29/13  4:23 PM      Result Value Ref Range   Sodium 134 (*) 137 - 147 mEq/L   Potassium 3.4 (*) 3.7 - 5.3 mEq/L   Chloride 97  96 - 112 mEq/L   CO2 25  19 - 32 mEq/L   Glucose, Bld 118 (*) 70 - 99 mg/dL   BUN 9  6 - 23 mg/dL   Creatinine, Ser 0.63  0.50 - 1.10 mg/dL   Calcium 9.0  8.4 - 10.5 mg/dL   Total Protein 9.4 (*) 6.0 - 8.3 g/dL   Albumin 2.1 (*) 3.5 - 5.2 g/dL   AST 20  0 - 37 U/L   ALT 9  0 - 35 U/L   Alkaline Phosphatase 83  39 - 117 U/L   Total Bilirubin 0.6  0.3 - 1.2 mg/dL    GFR calc non Af Amer >90  >90 mL/min   GFR calc Af Amer >90  >90 mL/min   Comment: (NOTE)     The eGFR has been calculated using the CKD EPI equation.     This calculation has not been validated in all clinical situations.     eGFR's persistently <90 mL/min signify possible Chronic Kidney     Disease.  LIPASE, BLOOD     Status: None   Collection Time    07/29/13  4:23 PM      Result Value Ref Range   Lipase 24  11 - 59 U/L  URINALYSIS, ROUTINE W REFLEX MICROSCOPIC     Status: Abnormal   Collection Time    07/29/13  4:54 PM      Result Value Ref Range   Color, Urine ORANGE (*) YELLOW   Comment: BIOCHEMICALS MAY BE AFFECTED BY COLOR   APPearance CLOUDY (*) CLEAR   Specific Gravity, Urine 1.026  1.005 - 1.030   pH 5.0  5.0 - 8.0   Glucose, UA NEGATIVE  NEGATIVE mg/dL   Hgb urine dipstick MODERATE (*) NEGATIVE   Bilirubin Urine SMALL (*) NEGATIVE   Ketones, ur NEGATIVE  NEGATIVE mg/dL   Protein, ur 100 (*) NEGATIVE mg/dL   Urobilinogen, UA 1.0  0.0 - 1.0 mg/dL   Nitrite POSITIVE (*) NEGATIVE   Leukocytes, UA MODERATE (*) NEGATIVE  PREGNANCY, URINE     Status: None   Collection Time    07/29/13  4:54 PM      Result Value Ref Range   Preg Test, Ur NEGATIVE  NEGATIVE   Comment:            THE SENSITIVITY OF THIS     METHODOLOGY IS >20 mIU/mL.  URINE MICROSCOPIC-ADD ON     Status: Abnormal   Collection Time    07/29/13  4:54 PM      Result Value Ref Range   Squamous Epithelial / LPF RARE  RARE   WBC, UA 21-50  <3 WBC/hpf   Bacteria, UA MANY (*) RARE   Casts GRANULAR CAST (*) NEGATIVE   Crystals CA OXALATE CRYSTALS (*) NEGATIVE   Urine-Other MUCOUS PRESENT     No results found for this or any previous visit (from the past 240 hour(s)). 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 Right perinephric fluid collection.  MS. Right pyelonephritis. XGP is in the differential diagnosis.  Plan:  I reviewed the CT scan with Dr. Jacqulynn Cadet. 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 I do not think she would benefit from a double-J stent placement either. She needs percutaneous drainage of the perinephric fluid collection. Continue IV antibiotics. She will need a renal scan for differential renal function. If she has minimal renal function on the right side probably need a nephrectomy.  We will follow the patient with you.  CC: Dr. Legrand Pitts, Charlene Brooke 07/29/2013, 9:29 PM

## 2013-07-29 NOTE — ED Notes (Signed)
Pt is NPO per Dr. Criss Alvine

## 2013-07-29 NOTE — ED Notes (Signed)
Pt transported to CT ?

## 2013-07-29 NOTE — ED Notes (Signed)
Bed: OB09 Expected date:  Expected time:  Means of arrival:  Comments: EMS/M.S./abd. pain

## 2013-07-29 NOTE — Progress Notes (Signed)
ANTIBIOTIC CONSULT NOTE - INITIAL  Pharmacy Consult for Vancomycin/Zosyn Indication: Sepsis/Intra-abdominal infection  No Known Allergies  Patient Measurements: Height: 5\' 5"  (165.1 cm) Weight: 174 lb 2.6 oz (79 kg) IBW/kg (Calculated) : 57  Vital Signs: Temp: 99.5 F (37.5 C) (06/21 2200) Temp src: Rectal (06/21 2200) BP: 106/70 mmHg (06/21 2200) Pulse Rate: 105 (06/21 2200) Intake/Output from previous day:   Intake/Output from this shift:    Labs:  Recent Labs  07/29/13 1623  WBC 10.0  HGB 7.5*  PLT 429*  CREATININE 0.63   Estimated Creatinine Clearance: 95.2 ml/min (by C-G formula based on Cr of 0.63). No results found for this basename: VANCOTROUGH, VANCOPEAK, VANCORANDOM, GENTTROUGH, GENTPEAK, GENTRANDOM, TOBRATROUGH, TOBRAPEAK, TOBRARND, AMIKACINPEAK, AMIKACINTROU, AMIKACIN,  in the last 72 hours   Microbiology: No results found for this or any previous visit (from the past 720 hour(s)).  Medical History: Past Medical History  Diagnosis Date  . MS (multiple sclerosis)     Medications:  Scheduled:  . sodium chloride   Intravenous STAT  . baclofen  10 mg Oral BID  . [START ON 07/30/2013] benztropine  1 mg Oral BID  . [START ON 07/30/2013] levothyroxine  50 mcg Oral QAC breakfast  . [START ON 07/30/2013] piperacillin-tazobactam (ZOSYN)  IV  3.375 g Intravenous Q8H  . potassium chloride  10 mEq Intravenous Q1 Hr x 2  . sodium chloride  3 mL Intravenous Q12H  . vancomycin  1,000 mg Intravenous STAT  . [START ON 07/30/2013] vancomycin  750 mg Intravenous Q8H   Infusions:  . sodium chloride 100 mL/hr (07/29/13 2217)   Assessment:  43 yr female with h/o MS.  CTAbd shows kidney stone, chronic pyelonephritis, psoas abscess and concerned of perinephric abscess.  Urology consuled   Urine and blood cultures ordered  Zosyn 3.375gm given @ 20:59 and Vancomycin 1gm given @ 23:00  Pharmacy consulted to continue dosing of Zosyn and Vancomycin for  Sepsis/Intra-abdominal infection  Goal of Therapy:  Vancomycin trough level 15-20 mcg/ml  Plan:  Measure antibiotic drug levels at steady state Follow up culture results Zosyn 3.375gm IV q8h (each dose infused over 4 hrs) Vancomycin 750mg  IV q8h  , Joselyn Glassman, PharmD 07/29/2013,11:28 PM

## 2013-07-29 NOTE — ED Notes (Signed)
Per EMS pt has been having ABD pain x 1 wk, last BM Wed, painful urination, decreased appetite, 1 episode of vomiting last week.

## 2013-07-29 NOTE — H&P (Signed)
[facility] History and Physical  Patient: Jacqueline Norman  FFM:[id number]  DOB: [birth year]  DOS: the patient was seen and examined on 07/29/2013 PCP: No primary provider on file.  Chief Complaint: Abdominal pain  HPI: Jacqueline Norman is a 43 y.o. female with Past medical history of multiple sclerosis with chronic quadriplegia and bedbound status. Patient presented with complaints of abdominal pain that has been ongoing since last one week. She also had an episode of vomiting one week ago and some nausea. She has bowel movements 2 days ago and has chronic constipation at her baseline. She complains of fever but no chills no cough no chest pain no shortness of breath no dizziness no lightheadedness. She mentions her MS is stable and does not have any new focal neurological deficit. She denies any hospitalization recently for prior. She denies any history of surgeries on her stomach or abdomen.  The patient is coming from home. And at her baseline dependent for most of her ADL.  Review of Systems: as mentioned in the history of present illness.  A Comprehensive review of the other systems is negative.  Past Medical History  Diagnosis Date  . MS (multiple sclerosis)    History reviewed. No pertinent past surgical history. Social History:  reports that she has quit smoking. She does not have any smokeless tobacco history on file. She reports that she does not drink alcohol or use illicit drugs.  No Known Allergies  No family history on file.  Prior to Admission medications   Medication Sig Start Date End Date Taking? Authorizing Provider  baclofen (LIORESAL) 10 MG tablet Take 10 mg by mouth 2 (two) times daily.   Yes Historical Provider, MD  benztropine (COGENTIN) 1 MG tablet Take 1 mg by mouth 2 (two) times daily.   Yes Historical Provider, MD  cholecalciferol (VITAMIN D) 1000 UNITS tablet Take 1,000 Units by mouth every morning.   Yes Historical Provider, MD  docusate sodium  (COLACE) 100 MG capsule Take 100 mg by mouth daily as needed for mild constipation.   Yes Historical Provider, MD  Interferon Beta-1b (BETASERON/EXTAVIA) 0.3 MG KIT injection Inject 0.3 mg into the skin every other day.   Yes Historical Provider, MD  levothyroxine (SYNTHROID, LEVOTHROID) 50 MCG tablet Take 50 mcg by mouth daily before breakfast.   Yes Historical Provider, MD  omega-3 acid ethyl esters (LOVAZA) 1 G capsule Take 1 g by mouth every morning.   Yes Historical Provider, MD  potassium chloride SA (K-DUR,KLOR-CON) 20 MEQ tablet Take 20 mEq by mouth every morning.   Yes Historical Provider, MD  simvastatin (ZOCOR) 5 MG tablet Take 2.5 mg by mouth at bedtime.   Yes Historical Provider, MD    Physical Exam: Filed Vitals:   07/29/13 1535 07/29/13 1943 07/29/13 1950  BP: 128/78  126/95  Pulse: 107  113  Temp: 98.5 F (36.9 C) 99.5 F (37.5 C) 102.1 F (38.9 C)  TempSrc: Oral Oral Oral  Resp:   17  SpO2: 98%  94%    General: Alert, Awake and Oriented to Time, Place and Person. Appear in mild distress Eyes: PERRL ENT: Oral Mucosa clear moist. Neck: no JVD Cardiovascular: S1 and S2 Present, no Murmur, Peripheral Pulses Present Respiratory: Bilateral Air entry equal and Decreased, Clear to Auscultation,  no Crackles,no wheezes Abdomen: Bowel Sound Present, Soft and mild diffuse tender Skin: no Rash Extremities: No Pedal edema, no calf tenderness Neurologic: Bilateral footdrop, no purposeful movement bilaterally upper and lower extremity  limited sensation.  Labs on Admission:  CBC:  Recent Labs Lab 07/29/13 1623  WBC 10.0  NEUTROABS 6.3  HGB 7.5*  HCT 26.2*  MCV 68.9*  PLT 429*    CMP     Component Value Date/Time   NA 134* 07/29/2013 1623   K 3.4* 07/29/2013 1623   CL 97 07/29/2013 1623   CO2 25 07/29/2013 1623   GLUCOSE 118* 07/29/2013 1623   BUN 9 07/29/2013 1623   CREATININE 0.63 07/29/2013 1623   CALCIUM 9.0 07/29/2013 1623   PROT 9.4* 07/29/2013 1623   ALBUMIN  2.1* 07/29/2013 1623   AST 20 07/29/2013 1623   ALT 9 07/29/2013 1623   ALKPHOS 83 07/29/2013 1623   BILITOT 0.6 07/29/2013 1623   GFRNONAA >90 07/29/2013 1623   GFRAA >90 07/29/2013 1623     Recent Labs Lab 07/29/13 1623  LIPASE 24   No results found for this basename: AMMONIA,  in the last 168 hours  No results found for this basename: CKTOTAL, CKMB, CKMBINDEX, TROPONINI,  in the last 168 hours BNP (last 3 results) No results found for this basename: PROBNP,  in the last 8760 hours  Radiological Exams on Admission: Ct Abdomen Pelvis W Contrast  07/29/2013   ADDENDUM REPORT: 07/29/2013 19:20  ADDENDUM: Xanthogranulomatous pyelonephritis is also in the differential diagnosis for the right renal findings.   Electronically Signed   By: Kathreen Devoid   On: 07/29/2013 19:20   07/29/2013   CLINICAL DATA:  Abdominal pain for 1 week.  Painful urination.  EXAM: CT ABDOMEN AND PELVIS WITH CONTRAST  TECHNIQUE: Multidetector CT imaging of the abdomen and pelvis was performed using the standard protocol following bolus administration of intravenous contrast.  CONTRAST:  125m OMNIPAQUE IOHEXOL 300 MG/ML SOLN, 543mOMNIPAQUE IOHEXOL 300 MG/ML SOLN  COMPARISON:  None.  FINDINGS: Right basilar atelectasis.  The liver demonstrates no focal abnormality. There is no intrahepatic or extrahepatic biliary ductal dilatation. The gallbladder is normal. The spleen demonstrates no focal abnormality. The adrenal glands and pancreas are normal.  Severe right nephromegaly with severe right hydronephrosis. There is a 2.3 cm calculus at the right UPJ. Numerous right nephrolithiasis. There is significant right perinephric stranding. There is a 7.6 x 5.8 cm complex fluid collection in the right psoas muscle. There are 2 punctate calcifications at the right UVJ which may reflect recently passed calculi versus soft tissue calcifications. There is right retroperitoneal lymphadenopathy with the largest retrocaval lymph node measuring  16.7 mm.  The stomach, duodenum, small intestine, and large intestine demonstrate no contrast extravasation or dilatation. There is no pneumoperitoneum, pneumatosis, or portal venous gas. There is no abdominal or pelvic free fluid. There is no lymphadenopathy. There are injection granulomas in the anterior and posterior subcutaneous fat.  The abdominal aorta is normal in caliber with atherosclerosis.  There are no lytic or sclerotic osseous lesions.  IMPRESSION: 1. Obstructing 2.3 cm right UPJ calculus. Severe right nephromegaly with severe right hydronephrosis and numerous right nephrolithiasis with perinephric stranding. 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 There is a 7.6 x 5.8 cm complex fluid collection in the right psoas muscle concerning for an abscess.  2. There is no contrast seen within the right collecting system on the delayed images consistent with impaired function.  3. There are 2 punctate calcifications at the right UVJ which may reflect recently passed calculi versus bladder wall calcifications.  Urology consultation is recommended in  Electronically Signed: By: HeKathreen Devoidn: 07/29/2013  19:13   Assessment/Plan Principal Problem:   Pyelonephritis Active Problems:   Perinephric abscess   Possible Psoas abscess   Multiple sclerosis   Microcytic anemia   Hypokalemia   Renal stone   1. Pyelonephritis Patient presents with complaints of abdominal pain. Her pain is diffuse in location. She denies any burning urination or bleeding. She is found to be febrile with evidence of pyuria. CT of the abdomen is positive for obstructing renal stone on right UPJ associated with severe pyelonephritis and possible psoas abscess. It is highly likely that the psoas is associated with pyelonephritis and is a possible perinephric abscess. Urology has been consulted who is consulting interventional radiology for possible drainage of the abscess and they will follow the patient  here tonight. Patient will be kept n.p.o. her possible procedure. IV fluids, IV vancomycin and IV Zosyn to cover both possible Psoas abscess and pyelonephritis broadly. IV Zofran and IV pain medications as needed.  2. Multiple sclerosis At present stable, need to monitor for flare.  3. microcytic anemia  Patient continues to have periods and she thinks her periods are heavy. At present will check further workup including INR, APTT, reticulocyte count, LDH, hemocult,  iron, ferritin, TIBC for further workup of anemia. Type and screen.  4. hypokalemia   replacing  Consults: Urology, general surgery  DVT Prophylaxis: subcutaneous Heparin Nutrition: N.p.o.   Code Status: Full   Family Communication:  Daughter was present at bedside, opportunity was given to ask question and all questions were answered satisfactorily at the time of interview. Disposition: Admitted to inpatient in step-down unit.  Author: Berle Mull, MD [facility] Pager: [phone] 07/29/2013, 9:07 PM    If 7PM-7AM, please contact night-coverage [email or web address] Password [email or web address]

## 2013-07-29 NOTE — Progress Notes (Signed)
Received report approximately 2100 from autum in the ED

## 2013-07-29 NOTE — ED Provider Notes (Signed)
CSN: [id number]     Arrival date & time 07/29/13  1558 History   First MD Initiated Contact with Patient 07/29/13 1559     Chief Complaint  Patient presents with  . Abdominal Pain   HPI Comments: Patient has advanced MS.  She is taken care of by her mother who is a Marine scientist.    Patient is a 43 y.o. female presenting with abdominal pain. The history is provided by the patient and a caregiver. No language interpreter was used.  Abdominal Pain Pain location:  Periumbilical and RLQ Pain quality: aching and cramping   Pain severity:  Severe Onset quality:  Gradual Duration:  4 days Timing:  Intermittent Progression:  Worsening Chronicity:  New Context: eating   Context: not alcohol use, not medication withdrawal, not previous surgeries, not recent illness, not recent sexual activity, not recent travel, not retching, not sick contacts and not trauma   Relieved by:  Nothing Worsened by:  Eating Ineffective treatments:  Bowel activity, vomiting and antacids (enema and xanax) Associated symptoms: anorexia, belching, fatigue, fever, flatus and nausea   Associated symptoms: no chest pain, no chills, no constipation, no cough, no diarrhea, no dysuria, no hematemesis, no hematochezia, no hematuria, no melena, no shortness of breath, no sore throat, no vaginal bleeding, no vaginal discharge and no vomiting   Risk factors: obesity   Risk factors: no alcohol abuse, no aspirin use, has not had multiple surgeries, no NSAID use, not pregnant and no recent hospitalization     Past Medical History  Diagnosis Date  . MS (multiple sclerosis)    History reviewed. No pertinent past surgical history. No family history on file. History  Substance Use Topics  . Smoking status: Former Research scientist (life sciences)  . Smokeless tobacco: Not on file  . Alcohol Use: No   OB History   Grav Para Term Preterm Abortions TAB SAB Ect Mult Living                 Review of Systems  Constitutional: Positive for fever, appetite change,  fatigue and unexpected weight change. Negative for chills.  HENT: Negative for sore throat.   Respiratory: Negative for cough, chest tightness and shortness of breath.   Cardiovascular: Negative for chest pain, palpitations and leg swelling.  Gastrointestinal: Positive for nausea, abdominal pain, anorexia and flatus. Negative for vomiting, diarrhea, constipation, melena, hematochezia and hematemesis.  Endocrine: Positive for cold intolerance.  Genitourinary: Negative for dysuria, hematuria, vaginal bleeding, vaginal discharge and difficulty urinating.  All other systems reviewed and are negative.     Allergies  Review of patient's allergies indicates no known allergies.  Home Medications   Prior to Admission medications   Medication Sig Start Date End Date Taking? Authorizing Provider  baclofen (LIORESAL) 10 MG tablet Take 10 mg by mouth 2 (two) times daily.   Yes Historical Provider, MD  benztropine (COGENTIN) 1 MG tablet Take 1 mg by mouth 2 (two) times daily.   Yes Historical Provider, MD  cholecalciferol (VITAMIN D) 1000 UNITS tablet Take 1,000 Units by mouth every morning.   Yes Historical Provider, MD  docusate sodium (COLACE) 100 MG capsule Take 100 mg by mouth daily as needed for mild constipation.   Yes Historical Provider, MD  Interferon Beta-1b (BETASERON/EXTAVIA) 0.3 MG KIT injection Inject 0.3 mg into the skin every other day.   Yes Historical Provider, MD  levothyroxine (SYNTHROID, LEVOTHROID) 50 MCG tablet Take 50 mcg by mouth daily before breakfast.   Yes Historical Provider, MD  omega-3 acid ethyl esters (LOVAZA) 1 G capsule Take 1 g by mouth every morning.   Yes Historical Provider, MD  potassium chloride SA (K-DUR,KLOR-CON) 20 MEQ tablet Take 20 mEq by mouth every morning.   Yes Historical Provider, MD  simvastatin (ZOCOR) 5 MG tablet Take 2.5 mg by mouth at bedtime.   Yes Historical Provider, MD   BP 126/95  Pulse 113  Temp(Src) 102.1 F (38.9 C) (Oral)  Resp 17   SpO2 94%  LMP 06/08/2013 Physical Exam  Nursing note and vitals reviewed. Constitutional: She is oriented to person, place, and time. She appears well-developed and well-nourished. No distress.  HENT:  Head: Normocephalic and atraumatic.  Right Ear: External ear normal.  Left Ear: External ear normal.  Nose: Nose normal.  Mouth/Throat: Oropharynx is clear and moist. No oropharyngeal exudate.  Eyes: Conjunctivae and EOM are normal. Pupils are equal, round, and reactive to light. No scleral icterus.  Neck: Normal range of motion. Neck supple. No thyromegaly present.  Cardiovascular: Normal rate, regular rhythm, normal heart sounds and intact distal pulses.  Exam reveals no gallop and no friction rub.   No murmur heard. Pulmonary/Chest: Effort normal and breath sounds normal. No respiratory distress. She has no wheezes. She has no rales. She exhibits no tenderness.  Abdominal: Soft. Bowel sounds are normal. She exhibits no distension. There is no hepatosplenomegaly. There is generalized tenderness. There is rebound and tenderness at McBurney's point. There is no rigidity, no guarding, no CVA tenderness and negative Murphy's sign.  Obese abdomen. Positive Rovsings.  Genitourinary: Rectal exam shows external hemorrhoid and anal tone abnormal. Rectal exam shows no internal hemorrhoid and no tenderness. Guaiac negative stool.  Musculoskeletal:  Flexion contractures of the hands, and bilateral dropfoot.    Lymphadenopathy:    She has no cervical adenopathy.  Neurological: She is alert and oriented to person, place, and time.  Skin: Skin is warm and dry. No rash noted. She is not diaphoretic. No erythema. No pallor.  Psychiatric: She has a normal mood and affect. Her behavior is normal. Judgment and thought content normal.    ED Course  Procedures (including critical care time) Labs Review Labs Reviewed  CBC WITH DIFFERENTIAL - Abnormal; Notable for the following:    RBC 3.80 (*)     Hemoglobin 7.5 (*)    HCT 26.2 (*)    MCV 68.9 (*)    MCH 19.7 (*)    MCHC 28.6 (*)    RDW 19.5 (*)    Platelets 429 (*)    All other components within normal limits  COMPREHENSIVE METABOLIC PANEL - Abnormal; Notable for the following:    Sodium 134 (*)    Potassium 3.4 (*)    Glucose, Bld 118 (*)    Total Protein 9.4 (*)    Albumin 2.1 (*)    All other components within normal limits  URINALYSIS, ROUTINE W REFLEX MICROSCOPIC - Abnormal; Notable for the following:    Color, Urine ORANGE (*)    APPearance CLOUDY (*)    Hgb urine dipstick MODERATE (*)    Bilirubin Urine SMALL (*)    Protein, ur 100 (*)    Nitrite POSITIVE (*)    Leukocytes, UA MODERATE (*)    All other components within normal limits  URINE MICROSCOPIC-ADD ON - Abnormal; Notable for the following:    Bacteria, UA MANY (*)    Casts GRANULAR CAST (*)    Crystals CA OXALATE CRYSTALS (*)    All other components  within normal limits  CULTURE, BLOOD (ROUTINE X 2)  CULTURE, BLOOD (ROUTINE X 2)  URINE CULTURE  LIPASE, BLOOD  PREGNANCY, URINE  OCCULT BLOOD X 1 CARD TO LAB, STOOL  RETICULOCYTES  FERRITIN  IRON AND TIBC  PROTIME-INR  APTT  LACTATE DEHYDROGENASE  TYPE AND SCREEN    Imaging Review Ct Abdomen Pelvis W Contrast  07/29/2013   ADDENDUM REPORT: 07/29/2013 19:20  ADDENDUM: Xanthogranulomatous pyelonephritis is also in the differential diagnosis for the right renal findings.   Electronically Signed   By: Kathreen Devoid   On: 07/29/2013 19:20   07/29/2013   CLINICAL DATA:  Abdominal pain for 1 week.  Painful urination.  EXAM: CT ABDOMEN AND PELVIS WITH CONTRAST  TECHNIQUE: Multidetector CT imaging of the abdomen and pelvis was performed using the standard protocol following bolus administration of intravenous contrast.  CONTRAST:  134m OMNIPAQUE IOHEXOL 300 MG/ML SOLN, 571mOMNIPAQUE IOHEXOL 300 MG/ML SOLN  COMPARISON:  None.  FINDINGS: Right basilar atelectasis.  The liver demonstrates no focal abnormality.  There is no intrahepatic or extrahepatic biliary ductal dilatation. The gallbladder is normal. The spleen demonstrates no focal abnormality. The adrenal glands and pancreas are normal.  Severe right nephromegaly with severe right hydronephrosis. There is a 2.3 cm calculus at the right UPJ. Numerous right nephrolithiasis. There is significant right perinephric stranding. There is a 7.6 x 5.8 cm complex fluid collection in the right psoas muscle. There are 2 punctate calcifications at the right UVJ which may reflect recently passed calculi versus soft tissue calcifications. There is right retroperitoneal lymphadenopathy with the largest retrocaval lymph node measuring 16.7 mm.  The stomach, duodenum, small intestine, and large intestine demonstrate no contrast extravasation or dilatation. There is no pneumoperitoneum, pneumatosis, or portal venous gas. There is no abdominal or pelvic free fluid. There is no lymphadenopathy. There are injection granulomas in the anterior and posterior subcutaneous fat.  The abdominal aorta is normal in caliber with atherosclerosis.  There are no lytic or sclerotic osseous lesions.  IMPRESSION: 1. Obstructing 2.3 cm right UPJ calculus. Severe right nephromegaly with severe right hydronephrosis and numerous right nephrolithiasis with perinephric stranding. 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 There is a 7.6 x 5.8 cm complex fluid collection in the right psoas muscle concerning for an abscess.  2. There is no contrast seen within the right collecting system on the delayed images consistent with impaired function.  3. There are 2 punctate calcifications at the right UVJ which may reflect recently passed calculi versus bladder wall calcifications.  Urology consultation is recommended in  Electronically Signed: By: HeKathreen Devoidn: 07/29/2013 19:13     EKG Interpretation None      MDM   Final diagnoses:  Right nephrolithiasis  Pyelonephritis   Perinephric abscess   [facility] x 4 days.  CBC, CMP, Lipase, UA, and CT abdomen and pelvis were performed here as the patient has advanced MS and exam was difficult to perform due to sensation problems.  CBC showed leukocytosis and severe anemia.  Patient is hemoccult negative here in the ED.  CMP showed mild hypokalemia which was treated here in the ED.  SCr was within normal limits.  UA showed infection as well as granular casts and calcium oxalate crystals.  CT abdomen showed a 7.6x5.8 cm right psoas abscess and a 2.3 cm obstructing right UJP stone with nephromegaly.  Have consulted Urology at this time.  Urology has called IR  and would like to insert a stent and also believe that this is more of a perinephric abscess.  [facility] by Dr. Posey Pronto.  Patient has been started on IV Ceftriaxone for treatment of Pyelonephritis.        Kenard Gower, PA-C 07/29/13 2049  Kenard Gower, PA-C 07/29/13 2050

## 2013-07-30 ENCOUNTER — Inpatient Hospital Stay (HOSPITAL_COMMUNITY): Payer: Medicare Other

## 2013-07-30 DIAGNOSIS — N200 Calculus of kidney: Secondary | ICD-10-CM

## 2013-07-30 DIAGNOSIS — A419 Sepsis, unspecified organism: Secondary | ICD-10-CM | POA: Diagnosis present

## 2013-07-30 LAB — CBC WITH DIFFERENTIAL/PLATELET
Basophils Absolute: 0 10*3/uL (ref 0.0–0.1)
Basophils Relative: 0 % (ref 0–1)
EOS PCT: 2 % (ref 0–5)
Eosinophils Absolute: 0.2 10*3/uL (ref 0.0–0.7)
HCT: 24.9 % — ABNORMAL LOW (ref 36.0–46.0)
Hemoglobin: 7.1 g/dL — ABNORMAL LOW (ref 12.0–15.0)
Lymphocytes Relative: 15 % (ref 12–46)
Lymphs Abs: 1.5 10*3/uL (ref 0.7–4.0)
MCH: 19.8 pg — ABNORMAL LOW (ref 26.0–34.0)
MCHC: 28.5 g/dL — ABNORMAL LOW (ref 30.0–36.0)
MCV: 69.4 fL — ABNORMAL LOW (ref 78.0–100.0)
MONO ABS: 0.7 10*3/uL (ref 0.1–1.0)
MONOS PCT: 7 % (ref 3–12)
NEUTROS PCT: 76 % (ref 43–77)
Neutro Abs: 7.5 10*3/uL (ref 1.7–7.7)
Platelets: 368 10*3/uL (ref 150–400)
RBC: 3.59 MIL/uL — AB (ref 3.87–5.11)
RDW: 19.5 % — ABNORMAL HIGH (ref 11.5–15.5)
WBC: 9.9 10*3/uL (ref 4.0–10.5)

## 2013-07-30 LAB — COMPREHENSIVE METABOLIC PANEL
ALK PHOS: 77 U/L (ref 39–117)
ALT: 9 U/L (ref 0–35)
AST: 18 U/L (ref 0–37)
Albumin: 1.9 g/dL — ABNORMAL LOW (ref 3.5–5.2)
BUN: 6 mg/dL (ref 6–23)
CALCIUM: 8.6 mg/dL (ref 8.4–10.5)
CHLORIDE: 103 meq/L (ref 96–112)
CO2: 25 meq/L (ref 19–32)
Creatinine, Ser: 0.62 mg/dL (ref 0.50–1.10)
GFR calc Af Amer: >90 mL/min (ref >90)
GFR calc non Af Amer: >90 mL/min (ref >90)
Glucose, Bld: 106 mg/dL — ABNORMAL HIGH (ref 70–99)
POTASSIUM: 4 meq/L (ref 3.7–5.3)
SODIUM: 138 meq/L (ref 137–147)
Total Bilirubin: 0.7 mg/dL (ref 0.3–1.2)
Total Protein: 8.5 g/dL — ABNORMAL HIGH (ref 6.0–8.3)

## 2013-07-30 LAB — IRON AND TIBC
Iron: 12 ug/dL — ABNORMAL LOW (ref 42–135)
Saturation Ratios: 7 % — ABNORMAL LOW (ref 20–55)
TIBC: 181 ug/dL — AB (ref 250–470)
UIBC: 169 ug/dL (ref 125–400)

## 2013-07-30 LAB — MRSA PCR SCREENING: MRSA BY PCR: NEGATIVE

## 2013-07-30 LAB — FERRITIN: Ferritin: 173 ng/mL (ref 10–291)

## 2013-07-30 LAB — PREPARE RBC (CROSSMATCH)

## 2013-07-30 MED ORDER — FENTANYL CITRATE 0.05 MG/ML IJ SOLN
INTRAMUSCULAR | Status: AC
  Administered 2013-07-30: 50 ug
  Filled 2013-07-30: qty 2

## 2013-07-30 MED ORDER — SODIUM CHLORIDE 0.9 % IV BOLUS (SEPSIS)
500.0000 mL | Freq: Once | INTRAVENOUS | Status: AC
  Administered 2013-07-30: 500 mL via INTRAVENOUS

## 2013-07-30 MED ORDER — MIDAZOLAM HCL 2 MG/2ML IJ SOLN
INTRAMUSCULAR | Status: AC
  Administered 2013-07-30: 1 mg
  Filled 2013-07-30: qty 4

## 2013-07-30 MED ORDER — ACETAMINOPHEN 325 MG PO TABS
650.0000 mg | ORAL_TABLET | Freq: Four times a day (QID) | ORAL | Status: DC | PRN
  Administered 2013-07-31 – 2013-08-05 (×6): 650 mg via ORAL
  Filled 2013-07-30 (×7): qty 2

## 2013-07-30 NOTE — Progress Notes (Addendum)
INITIAL NUTRITION ASSESSMENT  DOCUMENTATION CODES Per approved criteria  -Not Applicable   INTERVENTION:  Currently NPO for procedure  Rec diet advancement to regular per MD Ensure Complete po BID, each supplement provides 350 kcal and 13 grams of protein (strawberry) when diet advanced RD to follow  NUTRITION DIAGNOSIS: Inadequate oral intake related to inability to eat as evidenced by npo status.   Goal: Intake of meals and supplements to meet >90% estimated needs.  Monitor:  Diet tolerance and advancement, labs, weight trend, intake.  Reason for Assessment: MST and low braden  43 y.o. female  Admitting Dx: Pyelonephritis  ASSESSMENT: Patient with MS and bed bound admitted with sepsis due to pyelonephritis, new right perinephric/psoas abscess for drain placement in IR.  Iron deficiency anemia.    Skin intact, decreased appetite and intake for the past 6 days and now NPO for procedure.  States UBW of 215 but unsure when.    Height: Ht Readings from Last 1 Encounters:  07/29/13 5\' 5"  (1.651 m)    Weight: Wt Readings from Last 1 Encounters:  07/29/13 174 lb 2.6 oz (79 kg)    Ideal Body Weight: 125 lbs  % Ideal Body Weight: 139  Wt Readings from Last 10 Encounters:  07/29/13 174 lb 2.6 oz (79 kg)    Usual Body Weight: recent UBW unknown  % Usual Body Weight: unknown  BMI:  Body mass index is 28.98 kg/(m^2).  Estimated Nutritional Needs: Kcal: 1700-1800 Protein: 75-85 gm Fluid: 2L daily  Skin: intact  Diet Order: NPO  EDUCATION NEEDS: -No education needs identified at this time   Intake/Output Summary (Last 24 hours) at 07/30/13 1123 Last data filed at 07/30/13 0805  Gross per 24 hour  Intake 2858.75 ml  Output    652 ml  Net 2206.75 ml    Labs:   Recent Labs Lab 07/29/13 1623 07/30/13 0700  NA 134* 138  K 3.4* 4.0  CL 97 103  CO2 25 25  BUN 9 6  CREATININE 0.63 0.62  CALCIUM 9.0 8.6  GLUCOSE 118* 106*    CBG (last 3)  No  results found for this basename: GLUCAP,  in the last 72 hours  Scheduled Meds: . baclofen  10 mg Oral BID  . benztropine  1 mg Oral BID  . levothyroxine  50 mcg Oral QAC breakfast  . piperacillin-tazobactam (ZOSYN)  IV  3.375 g Intravenous Q8H  . sodium chloride  3 mL Intravenous Q12H  . vancomycin  750 mg Intravenous Q8H    Continuous Infusions: . sodium chloride 100 mL/hr at 07/30/13 0335    Past Medical History  Diagnosis Date  . MS (multiple sclerosis)     History reviewed. No pertinent past surgical history.  Oran Rein, RD, LDN Clinical Inpatient Dietitian Pager:  [phone] Weekend and after hours pager:  [phone]

## 2013-07-30 NOTE — H&P (Signed)
Jacqueline Norman is an 43 y.o. female.   Chief Complaint: Pt with Multiple Sclerosis. Comes to ED with abd pain x 5 days; UTI symptoms CT evaluation reveals Rt chronic hydronephrosis /chronic pyelonephritis Also Rt psoas/perinephric abscess  Dr Janice Norrie has asked IR for consult regarding drain placement Dr Annamaria Boots has has reviewed imaging and chart Now scheduled for same  HPI: MS; UTI  Past Medical History  Diagnosis Date  . MS (multiple sclerosis)     History reviewed. No pertinent past surgical history.  No family history on file. Social History:  reports that she has quit smoking. She does not have any smokeless tobacco history on file. She reports that she does not drink alcohol or use illicit drugs.  Allergies: No Known Allergies  Medications Prior to Admission  Medication Sig Dispense Refill  . baclofen (LIORESAL) 10 MG tablet Take 10 mg by mouth 2 (two) times daily.      . benztropine (COGENTIN) 1 MG tablet Take 1 mg by mouth 2 (two) times daily.      . cholecalciferol (VITAMIN D) 1000 UNITS tablet Take 1,000 Units by mouth every morning.      . docusate sodium (COLACE) 100 MG capsule Take 100 mg by mouth daily as needed for mild constipation.      . Interferon Beta-1b (BETASERON/EXTAVIA) 0.3 MG KIT injection Inject 0.3 mg into the skin every other day.      . levothyroxine (SYNTHROID, LEVOTHROID) 50 MCG tablet Take 50 mcg by mouth daily before breakfast.      . omega-3 acid ethyl esters (LOVAZA) 1 G capsule Take 1 g by mouth every morning.      . potassium chloride SA (K-DUR,KLOR-CON) 20 MEQ tablet Take 20 mEq by mouth every morning.      . simvastatin (ZOCOR) 5 MG tablet Take 2.5 mg by mouth at bedtime.        Results for orders placed during the hospital encounter of 07/29/13 (from the past 48 hour(s))  CBC WITH DIFFERENTIAL     Status: Abnormal   Collection Time    07/29/13  4:23 PM      Result Value Ref Range   WBC 10.0  4.0 - 10.5 K/uL   RBC 3.80 (*) 3.87 - 5.11 MIL/uL    Hemoglobin 7.5 (*) 12.0 - 15.0 g/dL   HCT 26.2 (*) 36.0 - 46.0 %   MCV 68.9 (*) 78.0 - 100.0 fL   MCH 19.7 (*) 26.0 - 34.0 pg   MCHC 28.6 (*) 30.0 - 36.0 g/dL   RDW 19.5 (*) 11.5 - 15.5 %   Platelets 429 (*) 150 - 400 K/uL   Neutrophils Relative % 63  43 - 77 %   Lymphocytes Relative 27  12 - 46 %   Monocytes Relative 8  3 - 12 %   Eosinophils Relative 2  0 - 5 %   Basophils Relative 0  0 - 1 %   Neutro Abs 6.3  1.7 - 7.7 K/uL   Lymphs Abs 2.7  0.7 - 4.0 K/uL   Monocytes Absolute 0.8  0.1 - 1.0 K/uL   Eosinophils Absolute 0.2  0.0 - 0.7 K/uL   Basophils Absolute 0.0  0.0 - 0.1 K/uL   Smear Review MORPHOLOGY UNREMARKABLE    COMPREHENSIVE METABOLIC PANEL     Status: Abnormal   Collection Time    07/29/13  4:23 PM      Result Value Ref Range   Sodium 134 (*) 137 -  147 mEq/L   Potassium 3.4 (*) 3.7 - 5.3 mEq/L   Chloride 97  96 - 112 mEq/L   CO2 25  19 - 32 mEq/L   Glucose, Bld 118 (*) 70 - 99 mg/dL   BUN 9  6 - 23 mg/dL   Creatinine, Ser 0.63  0.50 - 1.10 mg/dL   Calcium 9.0  8.4 - 10.5 mg/dL   Total Protein 9.4 (*) 6.0 - 8.3 g/dL   Albumin 2.1 (*) 3.5 - 5.2 g/dL   AST 20  0 - 37 U/L   ALT 9  0 - 35 U/L   Alkaline Phosphatase 83  39 - 117 U/L   Total Bilirubin 0.6  0.3 - 1.2 mg/dL   GFR calc non Af Amer >90  >90 mL/min   GFR calc Af Amer >90  >90 mL/min   Comment: (NOTE)     The eGFR has been calculated using the CKD EPI equation.     This calculation has not been validated in all clinical situations.     eGFR's persistently <90 mL/min signify possible Chronic Kidney     Disease.  LIPASE, BLOOD     Status: None   Collection Time    07/29/13  4:23 PM      Result Value Ref Range   Lipase 24  11 - 59 U/L  URINALYSIS, ROUTINE W REFLEX MICROSCOPIC     Status: Abnormal   Collection Time    07/29/13  4:54 PM      Result Value Ref Range   Color, Urine ORANGE (*) YELLOW   Comment: BIOCHEMICALS MAY BE AFFECTED BY COLOR   APPearance CLOUDY (*) CLEAR   Specific Gravity,  Urine 1.026  1.005 - 1.030   pH 5.0  5.0 - 8.0   Glucose, UA NEGATIVE  NEGATIVE mg/dL   Hgb urine dipstick MODERATE (*) NEGATIVE   Bilirubin Urine SMALL (*) NEGATIVE   Ketones, ur NEGATIVE  NEGATIVE mg/dL   Protein, ur 100 (*) NEGATIVE mg/dL   Urobilinogen, UA 1.0  0.0 - 1.0 mg/dL   Nitrite POSITIVE (*) NEGATIVE   Leukocytes, UA MODERATE (*) NEGATIVE  PREGNANCY, URINE     Status: None   Collection Time    07/29/13  4:54 PM      Result Value Ref Range   Preg Test, Ur NEGATIVE  NEGATIVE   Comment:            THE SENSITIVITY OF THIS     METHODOLOGY IS >20 mIU/mL.  URINE MICROSCOPIC-ADD ON     Status: Abnormal   Collection Time    07/29/13  4:54 PM      Result Value Ref Range   Squamous Epithelial / LPF RARE  RARE   WBC, UA 21-50  <3 WBC/hpf   Bacteria, UA MANY (*) RARE   Casts GRANULAR CAST (*) NEGATIVE   Crystals CA OXALATE CRYSTALS (*) NEGATIVE   Urine-Other MUCOUS PRESENT    TYPE AND SCREEN     Status: None   Collection Time    07/29/13  9:04 PM      Result Value Ref Range   ABO/RH(D) O POS     Antibody Screen NEG     Sample Expiration 08/01/2013    ABO/RH     Status: None   Collection Time    07/29/13  9:04 PM      Result Value Ref Range   ABO/RH(D) O POS    RETICULOCYTES     Status: None  Collection Time    07/29/13  9:05 PM      Result Value Ref Range   Retic Ct Pct 1.8  0.4 - 3.1 %   RBC. 4.25  3.87 - 5.11 MIL/uL   Retic Count, Manual 76.5  19.0 - 186.0 K/uL  FERRITIN     Status: None   Collection Time    07/29/13  9:05 PM      Result Value Ref Range   Ferritin 173  10 - 291 ng/mL   Comment: Performed at [facility]     Status: Abnormal   Collection Time    07/29/13  9:05 PM      Result Value Ref Range   Iron 12 (*) 42 - 135 ug/dL   TIBC 181 (*) 250 - 470 ug/dL   Saturation Ratios 7 (*) 20 - 55 %   UIBC 169  125 - 400 ug/dL   Comment: Performed at [facility]     Status: None   Collection Time     07/29/13  9:05 PM      Result Value Ref Range   Prothrombin Time 15.1  11.6 - 15.2 seconds   INR 1.22  0.00 - 1.49  APTT     Status: None   Collection Time    07/29/13  9:05 PM      Result Value Ref Range   aPTT 26  24 - 37 seconds  LACTATE DEHYDROGENASE     Status: None   Collection Time    07/29/13  9:05 PM      Result Value Ref Range   LDH 157  94 - 250 U/L  MRSA PCR SCREENING     Status: None   Collection Time    07/29/13 10:53 PM      Result Value Ref Range   MRSA by PCR NEGATIVE  NEGATIVE   Comment:            The GeneXpert MRSA Assay (FDA     approved for NASAL specimens     only), is one component of a     comprehensive MRSA colonization     surveillance program. It is not     intended to diagnose MRSA     infection nor to guide or     monitor treatment for     MRSA infections.  CBC WITH DIFFERENTIAL     Status: Abnormal   Collection Time    07/30/13  7:00 AM      Result Value Ref Range   WBC 9.9  4.0 - 10.5 K/uL   RBC 3.59 (*) 3.87 - 5.11 MIL/uL   Hemoglobin 7.1 (*) 12.0 - 15.0 g/dL   HCT 24.9 (*) 36.0 - 46.0 %   MCV 69.4 (*) 78.0 - 100.0 fL   MCH 19.8 (*) 26.0 - 34.0 pg   MCHC 28.5 (*) 30.0 - 36.0 g/dL   RDW 19.5 (*) 11.5 - 15.5 %   Platelets 368  150 - 400 K/uL   Neutrophils Relative % 76  43 - 77 %   Lymphocytes Relative 15  12 - 46 %   Monocytes Relative 7  3 - 12 %   Eosinophils Relative 2  0 - 5 %   Basophils Relative 0  0 - 1 %   Neutro Abs 7.5  1.7 - 7.7 K/uL   Lymphs Abs 1.5  0.7 - 4.0 K/uL   Monocytes Absolute 0.7  0.1 -  1.0 K/uL   Eosinophils Absolute 0.2  0.0 - 0.7 K/uL   Basophils Absolute 0.0  0.0 - 0.1 K/uL   RBC Morphology POLYCHROMASIA PRESENT    COMPREHENSIVE METABOLIC PANEL     Status: Abnormal   Collection Time    07/30/13  7:00 AM      Result Value Ref Range   Sodium 138  137 - 147 mEq/L   Potassium 4.0  3.7 - 5.3 mEq/L   Chloride 103  96 - 112 mEq/L   CO2 25  19 - 32 mEq/L   Glucose, Bld 106 (*) 70 - 99 mg/dL   BUN 6  6 - 23  mg/dL   Creatinine, Ser 0.62  0.50 - 1.10 mg/dL   Calcium 8.6  8.4 - 10.5 mg/dL   Total Protein 8.5 (*) 6.0 - 8.3 g/dL   Albumin 1.9 (*) 3.5 - 5.2 g/dL   AST 18  0 - 37 U/L   ALT 9  0 - 35 U/L   Alkaline Phosphatase 77  39 - 117 U/L   Total Bilirubin 0.7  0.3 - 1.2 mg/dL   GFR calc non Af Amer >90  >90 mL/min   GFR calc Af Amer >90  >90 mL/min   Comment: (NOTE)     The eGFR has been calculated using the CKD EPI equation.     This calculation has not been validated in all clinical situations.     eGFR's persistently <90 mL/min signify possible Chronic Kidney     Disease.   Ct Abdomen Pelvis W Contrast  07/29/2013   ADDENDUM REPORT: 07/29/2013 19:20  ADDENDUM: Xanthogranulomatous pyelonephritis is also in the differential diagnosis for the right renal findings.   Electronically Signed   By: Kathreen Devoid   On: 07/29/2013 19:20   07/29/2013   CLINICAL DATA:  Abdominal pain for 1 week.  Painful urination.  EXAM: CT ABDOMEN AND PELVIS WITH CONTRAST  TECHNIQUE: Multidetector CT imaging of the abdomen and pelvis was performed using the standard protocol following bolus administration of intravenous contrast.  CONTRAST:  122m OMNIPAQUE IOHEXOL 300 MG/ML SOLN, 511mOMNIPAQUE IOHEXOL 300 MG/ML SOLN  COMPARISON:  None.  FINDINGS: Right basilar atelectasis.  The liver demonstrates no focal abnormality. There is no intrahepatic or extrahepatic biliary ductal dilatation. The gallbladder is normal. The spleen demonstrates no focal abnormality. The adrenal glands and pancreas are normal.  Severe right nephromegaly with severe right hydronephrosis. There is a 2.3 cm calculus at the right UPJ. Numerous right nephrolithiasis. There is significant right perinephric stranding. There is a 7.6 x 5.8 cm complex fluid collection in the right psoas muscle. There are 2 punctate calcifications at the right UVJ which may reflect recently passed calculi versus soft tissue calcifications. There is right retroperitoneal  lymphadenopathy with the largest retrocaval lymph node measuring 16.7 mm.  The stomach, duodenum, small intestine, and large intestine demonstrate no contrast extravasation or dilatation. There is no pneumoperitoneum, pneumatosis, or portal venous gas. There is no abdominal or pelvic free fluid. There is no lymphadenopathy. There are injection granulomas in the anterior and posterior subcutaneous fat.  The abdominal aorta is normal in caliber with atherosclerosis.  There are no lytic or sclerotic osseous lesions.  IMPRESSION: 1. Obstructing 2.3 cm right UPJ calculus. Severe right nephromegaly with severe right hydronephrosis and numerous right nephrolithiasis with perinephric stranding. 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 There is a 7.6 x 5.8 cm complex fluid collection in the right psoas muscle concerning for  an abscess.  2. There is no contrast seen within the right collecting system on the delayed images consistent with impaired function.  3. There are 2 punctate calcifications at the right UVJ which may reflect recently passed calculi versus bladder wall calcifications.  Urology consultation is recommended in  Electronically Signed: By: Kathreen Devoid On: 07/29/2013 19:13    Review of Systems  Constitutional: Positive for fever.       Low grade temp  Eyes: Negative for blurred vision.  Respiratory: Negative for cough.   Cardiovascular: Negative for chest pain.  Gastrointestinal: Positive for nausea and abdominal pain.  Genitourinary: Positive for dysuria, urgency, frequency and flank pain.  Musculoskeletal: Negative for myalgias.  Neurological: Positive for weakness. Negative for dizziness and headaches.    Blood pressure 100/71, pulse 102, temperature 99.5 F (37.5 C), temperature source Core (Comment), resp. rate 19, height 5' 5"  (1.651 m), weight 79 kg (174 lb 2.6 oz), last menstrual period 06/08/2013, SpO2 99.00%. Physical Exam  Constitutional: She appears  well-nourished.  Cardiovascular: Normal rate and regular rhythm.   No murmur heard. Respiratory: Effort normal. She has no wheezes.  GI: Soft. Bowel sounds are normal. She exhibits no distension.  Musculoskeletal:  Unable to use all extremities     Assessment/Plan MS UTI Chronic R hydronephrosis/pyelonephritis New R perinephric/psoas abscess  For drain placement now in IR Pt and family aware of procedure benefits and risks and agreeable to proceed Consent signed and in chart  TURPIN,PAMELA A 07/30/2013, 9:04 AM

## 2013-07-30 NOTE — Progress Notes (Signed)
CARE MANAGEMENT NOTE 07/30/2013  Patient:  Jacqueline Norman, Jacqueline Norman   Account Number:  [id number]  Date Initiated:  07/30/2013  Documentation initiated by:  DAVIS,RHONDA  Subjective/Objective Assessment:   pt with hx of ms and bed bound, presents with temp 102 and urinary tract symptoms, found to have Obstructing 2.3 cm right UPJ calculus. Severe right nephromegaly  with severe right hydronephrosis and numerous right nephrolithiasis  with per     Action/Plan:   from home has hha through [facility]/family support group/   Anticipated DC Date:  08/02/2013   Anticipated DC Plan:  HOME W HOME HEALTH SERVICES  In-house referral  NA      DC Planning Services  NA      [facility] Choice  NA   Choice offered to / List presented to:  NA   DME arranged  NA      DME agency  NA     HH arranged  NA      HH agency  [employer]   Status of service:  In process, will continue to follow Medicare Important Message given?  NA - LOS <3 / Initial given by admissions (If response is "NO", the following Medicare IM given date fields will be blank) Date Medicare IM given:  08/01/2013 Date Additional Medicare IM given:    Discharge Disposition:    Per UR Regulation:  Reviewed for med. necessity/level of care/duration of stay  If discussed at Long Length of Stay Meetings, dates discussed:    Comments:  [id number]/Rhonda Stark Jock, BSN, [place] [phone] Chart Reviewed for discharge and hospital needs. Discharge needs at time of review: None present will follow for needs. IM UPDATE NEEDED ON [id number] IF REMAINS INPATIENT. Review of patient progress due on [id number].

## 2013-07-30 NOTE — Progress Notes (Signed)
PROGRESS NOTE  Jacqueline Norman LXB:[id number] DOB: 10-Nov-1970 DOA: 07/29/2013 PCP: No primary provider on file.  HPI: Jacqueline Norman is a 43 y.o. female with Past medical history of multiple sclerosis with chronic quadriplegia and bedbound status.  Patient presented with complaints of abdominal pain that has been ongoing since last one week. She also had an episode of vomiting one week ago and some nausea. She has bowel movements 2 days ago and has chronic constipation at her baseline. She complains of fever but no chills no cough no chest pain no shortness of breath no dizziness no lightheadedness.  She mentions her MS is stable and does not have any new focal neurological deficit.  She denies any hospitalization recently for prior. She denies any history of surgeries on her stomach or abdomen.  The patient is coming from home. And at her baseline dependent for most of her ADL.  Assessment/Plan: Sepsis due to pyelonephritis - meets criteria for sepsis with SBP < 90 and tachycardia plus infection source. CT scan showed obstructing calculus on the right with right hydronephrosis and the psoas muscle abscess. Urology consulted, appreciate input - patient needs drainage of abscess, IR consulted and appreciate input.   MS - seems at baseline at this point, closely monitor    Iron deficiency anemia - start on supplementation  - Likely anemia worsened in the setting of sepsis. No evidence of bleeding.  - Transfuse a unit of packed red blood cells later today after the procedure   Hypokalemia - Improved after repletion   Diet: NPO Fluids: NS 125 cc/h DVT Prophylaxis: SCD  Code Status: Full Family Communication: d/w husband at bedside   Disposition Plan: inpatient, remain in SDU.  Consultants:  Urology   IR  Procedures:  None    Antibiotics Vancomycin 6/22 >> Zosyn 6/22>>  HPI/Subjective: No complaints, mild abdominal pain  Objective: Filed Vitals:   07/30/13 0100 07/30/13  0200 07/30/13 0300 07/30/13 0400  BP: 97/69 83/57 103/71 97/61  Pulse: 92 89 93 90  Temp:    98.1 F (36.7 C)  TempSrc:    Core (Comment)  Resp: 18 17 18 15   Height:      Weight:      SpO2: 99% 100% 100% 100%    Intake/Output Summary (Last 24 hours) at 07/30/13 0718 Last data filed at 07/30/13 0641  Gross per 24 hour  Intake 2508.75 ml  Output    652 ml  Net 1856.75 ml   Filed Weights   07/29/13 2200  Weight: 79 kg (174 lb 2.6 oz)    Exam:   General:  NAD  Cardiovascular: regular rate and rhythm, without MRG  Respiratory: good air movement, clear to auscultation throughout, no wheezing, ronchi or rales  Abdomen: soft,  mild tenderness to palpation in the central area   MSK: no peripheral edema  Neuro: Bilateral footdrop, no purposeful movement bilaterally upper and lower extremity limited sensation  Data Reviewed: Basic Metabolic Panel:  Recent Labs Lab 07/29/13 1623  NA 134*  K 3.4*  CL 97  CO2 25  GLUCOSE 118*  BUN 9  CREATININE 0.63  CALCIUM 9.0   Liver Function Tests:  Recent Labs Lab 07/29/13 1623  AST 20  ALT 9  ALKPHOS 83  BILITOT 0.6  PROT 9.4*  ALBUMIN 2.1*    Recent Labs Lab 07/29/13 1623  LIPASE 24   CBC:  Recent Labs Lab 07/29/13 1623  WBC 10.0  NEUTROABS 6.3  HGB 7.5*  HCT  26.2*  MCV 68.9*  PLT 429*   Recent Results (from the past 240 hour(s))  MRSA PCR SCREENING     Status: None   Collection Time    07/29/13 10:53 PM      Result Value Ref Range Status   MRSA by PCR NEGATIVE  NEGATIVE Final   Comment:            The GeneXpert MRSA Assay (FDA     approved for NASAL specimens     only), is one component of a     comprehensive MRSA colonization     surveillance program. It is not     intended to diagnose MRSA     infection nor to guide or     monitor treatment for     MRSA infections.     Studies: Ct Abdomen Pelvis W Contrast  07/29/2013   ADDENDUM REPORT: 07/29/2013 19:20  ADDENDUM: Xanthogranulomatous  pyelonephritis is also in the differential diagnosis for the right renal findings.   Electronically Signed   By: Elige KoHetal  Patel   On: 07/29/2013 19:20   07/29/2013   CLINICAL DATA:  Abdominal pain for 1 week.  Painful urination.  EXAM: CT ABDOMEN AND PELVIS WITH CONTRAST  TECHNIQUE: Multidetector CT imaging of the abdomen and pelvis was performed using the standard protocol following bolus administration of intravenous contrast.  CONTRAST:  100mL OMNIPAQUE IOHEXOL 300 MG/ML SOLN, 50mL OMNIPAQUE IOHEXOL 300 MG/ML SOLN  COMPARISON:  None.  FINDINGS: Right basilar atelectasis.  The liver demonstrates no focal abnormality. There is no intrahepatic or extrahepatic biliary ductal dilatation. The gallbladder is normal. The spleen demonstrates no focal abnormality. The adrenal glands and pancreas are normal.  Severe right nephromegaly with severe right hydronephrosis. There is a 2.3 cm calculus at the right UPJ. Numerous right nephrolithiasis. There is significant right perinephric stranding. There is a 7.6 x 5.8 cm complex fluid collection in the right psoas muscle. There are 2 punctate calcifications at the right UVJ which may reflect recently passed calculi versus soft tissue calcifications. There is right retroperitoneal lymphadenopathy with the largest retrocaval lymph node measuring 16.7 mm.  The stomach, duodenum, small intestine, and large intestine demonstrate no contrast extravasation or dilatation. There is no pneumoperitoneum, pneumatosis, or portal venous gas. There is no abdominal or pelvic free fluid. There is no lymphadenopathy. There are injection granulomas in the anterior and posterior subcutaneous fat.  The abdominal aorta is normal in caliber with atherosclerosis.  There are no lytic or sclerotic osseous lesions.  IMPRESSION: 1. Obstructing 2.3 cm right UPJ calculus. Severe right nephromegaly with severe right hydronephrosis and numerous right nephrolithiasis with perinephric stranding. 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 There is a 7.6 x 5.8 cm complex fluid collection in the right psoas muscle concerning for an abscess.  2. There is no contrast seen within the right collecting system on the delayed images consistent with impaired function.  3. There are 2 punctate calcifications at the right UVJ which may reflect recently passed calculi versus bladder wall calcifications.  Urology consultation is recommended in  Electronically Signed: By: Elige KoHetal  Patel On: 07/29/2013 19:13    Scheduled Meds: . sodium chloride   Intravenous STAT  . baclofen  10 mg Oral BID  . benztropine  1 mg Oral BID  . levothyroxine  50 mcg Oral QAC breakfast  . piperacillin-tazobactam (ZOSYN)  IV  3.375 g Intravenous Q8H  . sodium chloride  3 mL Intravenous Q12H  . vancomycin  750 mg Intravenous Q8H   Continuous Infusions: . sodium chloride 100 mL/hr at 07/30/13 0335    Principal Problem:   Pyelonephritis Active Problems:   Perinephric abscess   Possible Psoas abscess   Multiple sclerosis   Microcytic anemia   Hypokalemia   Renal stone   Time spent: 35  This note has been created with Education officer, environmental. Any transcriptional errors are unintentional.   Pamella Pert, MD [facility] Pager [phone]. If 7 PM - 7 AM, please contact night-coverage at [email or web address], password [email or web address] 07/30/2013, 7:18 AM  LOS: 1 day

## 2013-07-30 NOTE — Progress Notes (Signed)
Subjective: Patient reports Feels better today.  Has less pain.  Objective: Vital signs in last 24 hours: Temp:  [98.1 F (36.7 C)-102.1 F (38.9 C)] 99.5 F (37.5 C) (06/22 1825) Pulse Rate:  [89-113] 96 (06/22 1825) Resp:  [9-22] 18 (06/22 1825) BP: (79-126)/(47-95) 93/58 mmHg (06/22 1825) SpO2:  [90 %-100 %] 100 % (06/22 1825) Weight:  [79 kg (174 lb 2.6 oz)] 79 kg (174 lb 2.6 oz) (06/21 2200)  Intake/Output from previous day: 06/21 0701 - 06/22 0700 In: 2608.8 [I.V.:2158.8; IV Piggyback:450] Out: 652 [Urine:652] Intake/Output this shift:    Physical Exam:  General: Abdomen: Soft, non distended.  Less tender today. Foley draining clear urine. Right flank drain with moderate amount of swero sanguineous fluid.  Lab Results:  Recent Labs  07/29/13 1623 07/30/13 0700  HGB 7.5* 7.1*  HCT 26.2* 24.9*   BMET  Recent Labs  07/29/13 1623 07/30/13 0700  NA 134* 138  K 3.4* 4.0  CL 97 103  CO2 25 25  GLUCOSE 118* 106*  BUN 9 6  CREATININE 0.63 0.62  CALCIUM 9.0 8.6    Recent Labs  07/29/13 2105  INR 1.22   No results found for this basename: LABURIN,  in the last 72 hours Results for orders placed during the hospital encounter of 07/29/13  MRSA PCR SCREENING     Status: None   Collection Time    07/29/13 10:53 PM      Result Value Ref Range Status   MRSA by PCR NEGATIVE  NEGATIVE Final   Comment:            The GeneXpert MRSA Assay (FDA     approved for NASAL specimens     only), is one component of a     comprehensive MRSA colonization     surveillance program. It is not     intended to diagnose MRSA     infection nor to guide or     monitor treatment for     MRSA infections.    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   Electronically Signed   By: Hetal  Patel   On: 07/29/2013  19:20   07/29/2013   CLINICAL DATA:  Abdominal pain for 1 week.  Painful urination.  EXAM: CT ABDOMEN AND PELVIS WITH CONTRAST  TECHNIQUE: Multidetector CT imaging of the abdomen and pelvis was performed using the standard protocol following bolus administration of intravenous contrast.  CONTRAST:  <MEASUREMEN745 AirpEpimenio 40[place]8703Toni [facility] Ho325 1EneMarland KitchenNorton Blizzardnera Gripesruins00 MG/ML SOLN, 50m66mo2HillKentuckyiVa [facility] 187 [address] MeadEpimenio 27[place]2226Toni [facility] Green 559-0EneMarland KitchenNorton Blizzardnera Gripesruins Lukes HealFort Br(51mo2HillKentuckyiUt Health [facility] H 10 CentraEpimenio 765-[place]7623Toni [facility] Ho440-2EneMarland KitchenNorton Blizzardnera GripesruinsSpecialty Hospital19mo2HillKentuckyiSurgery Center Of [facility] 8076 YuEpimenio 85[place]9(332Toni [facility] ([phone]) 8EneMarland KitchenNorton Blizzardnera Gripesruinst Memorial HosLos 43mo2HillKentuckyiDiginity Health-St.Rose Dominican Blue Daimond CMarland K 9123 CreekEpimenio (725)[place]1(463Toni [facility] - Mar403-6EneMarland KitchenNorton Blizzardnera Gripesruinsore Medical CtCo50mo2HillKentuckyiBanner B 93 CobblestoEpimenio 760-[place]0(504)Toni [facility] 587-4EneMarland KitchenNorton Blizzardnera GripesruinsKitchLEl Paso Chil66mo2HillKentuckyiMaimonides Med 97 FremoEpimenio (858)[place]8319Toni [facility] 409EneMarland KitchenNorton Blizzardnera Gripesruinsa Health SystemsKo(415mo2HillKentuckyiNortheast Rehabilitation HosMar 287 East CouEpimenio 928-[place]4773Toni [facility] Lopez332-2EneMarland KitchenNorton 24 EuclEpimenio (704)[place]4713Toni [facility] Ho509 1EneMarland KitchenNorton Blizzardnera Gripesruinserm Care Hospita24mo2HillKentuckyiEastern Orange [facility] Cent 10 Squaw CrEpimenio 651-[place]8(505Toni [facility] [phone] KitchenNorton Blizzardnera GripesruinsCrescent EndFond d66mo2HillKentuckyiAdvanced 8881 WaynEpimenio 838 [place]7952Toni [facility] 660-7EneMarland KitchenNorton [address]. LogaEpimenio 778-[place]7440Toni [facility] Ho([phone]) 6EneMarland KitchenNorton Blizzardnera Gripesruinsand KitchLGatewayA39mo2HillKentuckyiDesert Regional Medical C 8663 BirchwEpimenio (540)[place]0620Toni [facility] Ho(574)2EneMarland KitchenNorton Blizzardnera GripesruinsRehabiliLaurel Mou73mo2HillKentuck 58 Leeton RidgeEpimenio 21[place]7914Toni [facility] ([phone]) 5EneMarland KitchenNorton Blizzardnera Gripesruinsallup IndianBemus 63mo2HillKentuckyiPediatric 383 RyaEpimenio 903-[place]3458Toni [facility]737-7EneMarland KitchenNorton 16 Henry SmitEpimenio ([phone] [place]2614Toni [facility] 334EneMarland KitchenNorton Blizzardnera GripesruinsitchLDelta EndoKen64mo2HillKentuckyiCarrus Rehabilitation HosMarland KitchLAdvanced Surgical Care Of Incline Vi68mo2HillKentuckyiAgmg Endoscopy Center A General PartneMarland KitchLWise Health SuC(72mo2HillKentuckyi4Th [address] Laser And Surgery CenteMar 76 PrinceEpimenio 480 [place]62Toni [facility]404-6EneMarland KitchenNorton 659 Lake ForestEpimenio 617-[place]45Toni [facility] Ho586-4EneMarland KitchenNorton Blizzardnera Gripesruinslth East ValleyMar(539mo2HillKentuckyiRegency Hospital [facility] Northwest ArkMarland KitchLMidwest EyeGarden(412mo2HillKentuckyiHighlan 996 SelEpimenio (506)[place]26Toni [facility] Ho470-0EneMarland KitchenNorton 486 UnEpimenio (760) [place]2631Toni [facility] (586) 2EneMarland KitchenNorton 4 LanteEpimenio 337-[place]1954Toni [facility] Ho(873)3EneMarland KitchenNorton Blizza 97 MayfloEpimenio ([phone]) [place]7463Toni [facility] Ho260 6EneMarland KitchenNorton Blizzardnera Gripesruinsgehee-Desh44HMill (77mo2HillKentuckyiEye Surgery [facility] 87 ProspecEpimenio (306)[place]4903Toni [facility] Ohi620-7EneMarland KitchenNorton Blizzardnera Gripesruinsaint Peters UnivAn(1mo2HillKentuckyiEye Surgery Center Of Northern NMarland KitchLCharleston ECulbertsonhilton SiHEXOL 300 MG/ML SOLN  COMPARISON:  None.  FINDINGS: Right basilar atelectasis.  The liver demonstrates no focal abnormality. There is no intrahepatic or extrahepatic biliary ductal dilatation. The gallbladder is normal. The spleen demonstrates no focal abnormality. The adrenal glands and pancreas are normal.  Severe right nephromegaly with severe right hydronephrosis. There is a 2.3 cm calculus at the right UPJ. Numerous right nephrolithiasis. There is significant right perinephric stranding. There is a 7.6 x 5.8 cm complex fluid collection in the right psoas muscle. There are 2 punctate calcifications at the right UVJ which may reflect recently passed calculi versus soft tissue calcifications. There is right retroperitoneal lymphadenopathy with the largest retrocaval lymph node measuring 16.7 mm.  The stomach, duodenum, small intestine, and large intestine demonstrate no contrast extravasation or dilatation. There is no pneumoperitoneum, pneumatosis, or portal venous gas. There is no  abdominal or pelvic free fluid. There is no lymphadenopathy. There are injection granulomas in the anterior and posterior subcutaneous fat.  The abdominal aorta is normal in caliber with atherosclerosis.  There are no lytic or sclerotic osseous lesions.  IMPRESSION: 1. Obstructing 2.3 cm right UPJ calculus. Severe right nephromegaly with severe right hydronephrosis and numerous right nephrolithiasis with perinephric stranding. 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 There is a 7.6 x 5.8 cm complex fluid collection in the right psoas muscle concerning for an  abscess.  2. There is no contrast seen within the right collecting system on the delayed images consistent with impaired function.  3. There are 2 punctate calcifications at the right UVJ which may reflect recently passed calculi versus bladder wall calcifications.  Urology consultation is recommended in  Electronically Signed: By: Elige Ko On: 07/29/2013 19:13   Ct Image Guided Drainage Percut Cath  Peritoneal Retroperit  07/30/2013   CLINICAL DATA:  Right psoas abscess  EXAM: CT-GUIDED RIGHT PSOAS ABSCESS DRAIN INSERTION  Date:  6/22/20156/22/2015 3:29 PM  Radiologist:  Judie Petit. Ruel Favors, MD  Guidance:  CT  FLUOROSCOPY TIME:  NONE.  MEDICATIONS AND MEDICAL HISTORY: 2 MG VERSED, 50 MCG FENTANYL  ANESTHESIA/SEDATION: 15 min  CONTRAST:  None.  COMPLICATIONS: No immediate  PROCEDURE: Informed consent was obtained from the patient following explanation of the procedure, risks, benefits and alternatives. The patient understands, agrees and consents for the procedure. All questions were addressed. A time out was performed.  Maximal barrier sterile technique utilized including caps, mask, sterile gowns, sterile gloves, large sterile drape, hand hygiene, and Betadine.  Previous imaging reviewed. Patient positioned right anterior oblique. Noncontrast localization CT performed. The right iliopsoas abscess was localized with CT. Under sterile conditions and local anesthesia, an 18 gauge 15 cm access needle was advanced from a posterior flank approach into the psoas abscess. Needle position confirmed with CT. Syringe aspiration yielded purulent fluid compatible with an abscess. Guidewire inserted followed by tract dilatation to insert a 12 [place] drain. Retention loop formed in the abscess cavity. 200cc blood tinged purulent fluid aspirated. Samples sent for Gram stain and culture. Postprocedure imaging demonstrates significant collapse of the abscess cavity. Catheter secured with a Prolene suture and connected to  external suction bulb. Sterile dressing applied. No immediate complication. Patient tolerated the procedure well.  IMPRESSION: Successful CT-guided right psoas abscess drain insertion.   Electronically Signed   By: Ruel Favors M.D.   On: 07/30/2013 16:32    Assessment/Plan: Right psoas abscess.  Right nephrolithiasis.  Right pyelonephritis.  Right XGP. Right hydronephrosis. Leave drain in place.  Will get renal scan for differential renal function when stable.  LOS: 1 day   Danae Chen 07/30/2013, 7:17 PM

## 2013-07-30 NOTE — ED Provider Notes (Signed)
Medical screening examination/treatment/procedure(s) were conducted as a shared visit with non-physician practitioner(s) and myself.  I personally evaluated the patient during the encounter.   EKG Interpretation None       MS patient with abdominal pain. Has evidence of UTI, given rocephin. CT shows large right-sided renal calculus is obstructing. A severe right hydronephrosis and likely perinephric abscess. She developed a fever while in the ED as well. She was already given Rocephin because of the UTI prior to CT results. We'll still draw blood cultures and broaden her antibiotics given the abscess. I discussed results with the urologist on call, Dr. Brunilda Payor, who has reviewed the CT scan and will discuss with IR about likely drainage of abscess and nephrostomy tube. He also come see the patient. The patient was admitted to the hospitalist in the step down unit. Her blood pressures remained stable while in the ED.  Audree Camel, MD 07/30/13 [phone]

## 2013-07-30 NOTE — Procedures (Signed)
Successful RT psoas abscess 12 fr drain No comp Stable 200cc bloody pus aspirated No comp Stable Full report in PACS GS/CX SENT

## 2013-07-31 LAB — TYPE AND SCREEN
ABO/RH(D): O POS
Antibody Screen: NEGATIVE
Unit division: 0

## 2013-07-31 LAB — CBC
HCT: 28.3 % — ABNORMAL LOW (ref 36.0–46.0)
Hemoglobin: 8.3 g/dL — ABNORMAL LOW (ref 12.0–15.0)
MCH: 20.8 pg — ABNORMAL LOW (ref 26.0–34.0)
MCHC: 29.3 g/dL — ABNORMAL LOW (ref 30.0–36.0)
MCV: 70.9 fL — ABNORMAL LOW (ref 78.0–100.0)
PLATELETS: 356 10*3/uL (ref 150–400)
RBC: 3.99 MIL/uL (ref 3.87–5.11)
RDW: 19.5 % — ABNORMAL HIGH (ref 11.5–15.5)
WBC: 9.5 10*3/uL (ref 4.0–10.5)

## 2013-07-31 LAB — URINE CULTURE: Colony Count: >=100000

## 2013-07-31 LAB — VANCOMYCIN, TROUGH: Vancomycin Tr: 25.5 ug/mL (ref 10.0–20.0)

## 2013-07-31 MED ORDER — FERROUS SULFATE 325 (65 FE) MG PO TABS
325.0000 mg | ORAL_TABLET | Freq: Three times a day (TID) | ORAL | Status: DC
  Administered 2013-07-31 – 2013-08-05 (×16): 325 mg via ORAL
  Filled 2013-07-31 (×18): qty 1

## 2013-07-31 MED ORDER — VANCOMYCIN HCL IN DEXTROSE 1-5 GM/200ML-% IV SOLN
1000.0000 mg | Freq: Two times a day (BID) | INTRAVENOUS | Status: DC
  Administered 2013-08-01 – 2013-08-03 (×5): 1000 mg via INTRAVENOUS
  Filled 2013-07-31 (×6): qty 200

## 2013-07-31 MED ORDER — OXYCODONE HCL 5 MG PO TABS
5.0000 mg | ORAL_TABLET | ORAL | Status: DC | PRN
  Administered 2013-08-02 – 2013-08-04 (×2): 5 mg via ORAL
  Filled 2013-07-31 (×2): qty 1

## 2013-07-31 MED ORDER — MORPHINE SULFATE 2 MG/ML IJ SOLN
2.0000 mg | INTRAMUSCULAR | Status: DC | PRN
  Administered 2013-07-31: 2 mg via INTRAVENOUS
  Filled 2013-07-31: qty 1

## 2013-07-31 NOTE — Progress Notes (Signed)
ANTIBIOTIC CONSULT NOTE - FOLLOW UP  Pharmacy Consult for Vancomycin and Zosyn Indication: Sepsis, pyelonephritis  No Known Allergies  Patient Measurements: Height: 5\' 5"  (165.1 cm) Weight: 178 lb 5.6 oz (80.9 kg) IBW/kg (Calculated) : 57  Vital Signs: Temp: 98.1 F (36.7 C) (06/23 1100) Temp src: Core (Comment) (06/23 0800) BP: 83/54 mmHg (06/23 1100) Pulse Rate: 94 (06/23 0800) Intake/Output from previous day: 06/22 0701 - 06/23 0700 In: 3285.5 [I.V.:2303; Blood:382.5; IV Piggyback:600] Out: 590 [Urine:500; Drains:90] Intake/Output from this shift: Total I/O In: 200 [IV Piggyback:200] Out: 1030 [Urine:1000; Drains:30]  Labs:  Recent Labs  07/29/13 1623 07/30/13 0700 07/31/13 0815  WBC 10.0 9.9 9.5  HGB 7.5* 7.1* 8.3*  PLT 429* 368 356  CREATININE 0.63 0.62  --    Estimated Creatinine Clearance: 96.3 ml/min (by C-G formula based on Cr of 0.62). No results found for this basename: VANCOTROUGH, Leodis Binet, VANCORANDOM, GENTTROUGH, GENTPEAK, GENTRANDOM, TOBRATROUGH, TOBRAPEAK, TOBRARND, AMIKACINPEAK, AMIKACINTROU, AMIKACIN,  in the last 72 hours   Microbiology: Recent Results (from the past 720 hour(s))  URINE CULTURE     Status: None   Collection Time    07/29/13  4:54 PM      Result Value Ref Range Status   Specimen Description URINE, RANDOM   Final   Special Requests NONE   Final   Culture  Setup Time     Final   Value: 07/30/2013 11:10     Performed at [facility] Count     Final   Value: >=100,000 COLONIES/ML     Performed at [facility]   Culture     Final   Value: Multiple bacterial morphotypes present, none predominant. Suggest appropriate recollection if clinically indicated.     Performed at [facility]   Report Status 07/31/2013 FINAL   Final  CULTURE, BLOOD (ROUTINE X 2)     Status: None   Collection Time    07/29/13  9:03 PM      Result Value Ref Range Status   Specimen Description BLOOD RIGHT HAND   Final    Special Requests BOTTLES DRAWN AEROBIC AND ANAEROBIC 5CC   Final   Culture  Setup Time     Final   Value: 07/30/2013 04:34     Performed at [facility]   Culture     Final   Value:        BLOOD CULTURE RECEIVED NO GROWTH TO DATE CULTURE WILL BE HELD FOR 5 DAYS BEFORE ISSUING A FINAL NEGATIVE REPORT     Performed at [facility]   Report Status PENDING   Incomplete  CULTURE, BLOOD (ROUTINE X 2)     Status: None   Collection Time    07/29/13  9:04 PM      Result Value Ref Range Status   Specimen Description BLOOD RIGHT ARM   Final   Special Requests BOTTLES DRAWN AEROBIC ONLY 3CC   Final   Culture  Setup Time     Final   Value: 07/30/2013 04:34     Performed at [facility]   Culture     Final   Value:        BLOOD CULTURE RECEIVED NO GROWTH TO DATE CULTURE WILL BE HELD FOR 5 DAYS BEFORE ISSUING A FINAL NEGATIVE REPORT     Performed at [facility]   Report Status PENDING   Incomplete  MRSA PCR SCREENING     Status: None  Collection Time    07/29/13 10:53 PM      Result Value Ref Range Status   MRSA by PCR NEGATIVE  NEGATIVE Final   Comment:            The GeneXpert MRSA Assay (FDA     approved for NASAL specimens     only), is one component of a     comprehensive MRSA colonization     surveillance program. It is not     intended to diagnose MRSA     infection nor to guide or     monitor treatment for     MRSA infections.  CULTURE, ROUTINE-ABSCESS     Status: None   Collection Time    07/30/13  3:36 PM      Result Value Ref Range Status   Specimen Description DRAINAGE   Final   Special Requests Normal   Final   Gram Stain     Final   Value: ABUNDANT WBC PRESENT,BOTH PMN AND MONONUCLEAR     NO SQUAMOUS EPITHELIAL CELLS SEEN     FEW GRAM NEGATIVE RODS     [facility]   Culture     Final   Value: Culture reincubated for better growth     [facility]   Report Status PENDING   Incomplete   ANAEROBIC CULTURE     Status: None   Collection Time    07/30/13  3:36 PM      Result Value Ref Range Status   Specimen Description ABSCESS   Final   Special Requests Normal   Final   Gram Stain     Final   Value: ABUNDANT WBC PRESENT,BOTH PMN AND MONONUCLEAR     NO SQUAMOUS EPITHELIAL CELLS SEEN     FEW GRAM NEGATIVE RODS     [facility]   Culture PENDING   Incomplete   Report Status PENDING   Incomplete    Assessment: [age] yr female with h/o MS. CTAbd shows kidney stone, chronic pyelonephritis, psoas abscess and concerned of perinephric abscess s/p drainage 6/22. 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 Broad spectrum antibiotics started 6/21 for sepsis, pyelonephritis.  Anti-infectives: 6/21 >>Rocephin x1 6/21 >>Vancomycin >>  6/21 >> Zosyn >>  Microbiology: 6/21 blood: NGTD 6/21 urine: >100k multiple sp. 6/22 R psoas abscess: abundant WBC, few GNR (reincubating) 6/22 R psoas anaerobic: abundant WBC, few GNR (reincubating)  Labs / Vitals: Tmax: 101.3 WBCs: wnl Renal: Scr wnl, CrCl 95 ml/min CG, >100 ml/min N  Goal of Therapy:  Vancomycin trough level 15-20 mcg/ml Zosyn dose per renal function  Plan:   Check vancomycin trough today 15:30 before 5th maintenance dose  Continue Zosyn 3.375gm IV q8h (4hr extended infusions) Follow up renal function & cultures, de-escalation based on cultures  Loralee PacasErin Williamson, PharmD, BCPS Pager: [phone] 07/31/2013,11:25 AM

## 2013-07-31 NOTE — Progress Notes (Signed)
Subjective: Patient is resting in bed during time of exam. She denies any pain.  Objective: Physical Exam: BP 92/56  Pulse 82  Temp(Src) 98.1 F (36.7 C) (Core (Comment))  Resp 18  Ht 5\' 5"  (1.651 m)  Wt 178 lb 5.6 oz (80.9 kg)  BMI 29.68 kg/m2  SpO2 100%  LMP 06/08/2013  General: NAD, resting in bed Abd: Soft, minimal tenderness at drain site, right flank drain site dressing C/D/I, no signs of hematoma, output 24 hours 90 cc, 30 cc purulent bloody fluid today and 200 cc of bloody pus aspirated during procedure. Cx gram negative rods/pending  Labs: CBC  Recent Labs  07/30/13 0700 07/31/13 0815  WBC 9.9 9.5  HGB 7.1* 8.3*  HCT 24.9* 28.3*  PLT 368 356   BMET  Recent Labs  07/29/13 1623 07/30/13 0700  NA 134* 138  K 3.4* 4.0  CL 97 103  CO2 25 25  GLUCOSE 118* 106*  BUN 9 6  CREATININE 0.63 0.62  CALCIUM 9.0 8.6   LFT  Recent Labs  07/29/13 1623 07/30/13 0700  PROT 9.4* 8.5*  ALBUMIN 2.1* 1.9*  AST 20 18  ALT 9 9  ALKPHOS 83 77  BILITOT 0.6 0.7  LIPASE 24  --    PT/INR  Recent Labs  07/29/13 2105  LABPROT 15.1  INR 1.22     Studies/Results: Ct Abdomen Pelvis W Contrast  07/29/2013   ADDENDUM REPORT: 07/29/2013 19:20  ADDENDUM: Xanthogranulomatous pyelonephritis is also in the differential diagnosis for the right renal findings.   Electronically Signed   By: Elige Ko   On: 07/29/2013 19:20   07/29/2013   CLINICAL DATA:  Abdominal pain for 1 week.  Painful urination.  EXAM: CT ABDOMEN AND PELVIS WITH CONTRAST  TECHNIQUE: Multidetector CT imaging of the abdomen and pelvis was performed using the standard protocol following bolus administration of intravenous contrast.  CONTRAST:  OMNIPAQUE IOHEXOL 300 MG/ML SOLN, 50mL OMNIPAQUE IOHEXOL 300 MG/ML SOLN  COMPARISON:  None.  FINDINGS: Right basilar atelectasis.  The liver demonstrates no focal abnormality. There is no intrahepatic or extrahepatic biliary ductal dilatation. The gallbladder is  normal. The spleen demonstrates no focal abnormality. The adrenal glands and pancreas are normal.  Severe right nephromegaly with severe right hydronephrosis. There is a 2.3 cm calculus at the right UPJ. Numerous right nephrolithiasis. There is significant right perinephric stranding. There is a 7.6 x 5.8 cm complex fluid collection in the right psoas muscle. There are 2 punctate calcifications at the right UVJ which may reflect recently passed calculi versus soft tissue calcifications. There is right retroperitoneal lymphadenopathy with the largest retrocaval lymph node measuring 16.7 mm.  The stomach, duodenum, small intestine, and large intestine demonstrate no contrast extravasation or dilatation. There is no pneumoperitoneum, pneumatosis, or portal venous gas. There is no abdominal or pelvic free fluid. There is no lymphadenopathy. There are injection granulomas in the anterior and posterior subcutaneous fat.  The abdominal aorta is normal in caliber with atherosclerosis.  There are no lytic or sclerotic osseous lesions.  IMPRESSION: 1. Obstructing 2.3 cm right UPJ calculus. Severe right nephromegaly with severe right hydronephrosis and numerous right nephrolithiasis with perinephric stranding. 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 There is a 7.6 x 5.8 cm complex fluid collection in the right psoas muscle concerning for an abscess.  2. There is no contrast seen within the right collecting system on the delayed images consistent with impaired function.  3. There are 2 punctate  calcifications at the right UVJ which may reflect recently passed calculi versus bladder wall calcifications.  Urology consultation is recommended in  Electronically Signed: By: Elige KoHetal  Patel On: 07/29/2013 19:13   Ct Image Guided Drainage Percut Cath  Peritoneal Retroperit  07/30/2013   CLINICAL DATA:  Right psoas abscess  EXAM: CT-GUIDED RIGHT PSOAS ABSCESS DRAIN INSERTION  Date:  6/22/20156/22/2015 3:29 PM   Radiologist:  Judie PetitM. Ruel Favorsrevor Shick, MD  Guidance:  CT  FLUOROSCOPY TIME:  NONE.  MEDICATIONS AND MEDICAL HISTORY: 2 MG VERSED, 50 MCG FENTANYL  ANESTHESIA/SEDATION: 15 min  CONTRAST:  None.  COMPLICATIONS: No immediate  PROCEDURE: Informed consent was obtained from the patient following explanation of the procedure, risks, benefits and alternatives. The patient understands, agrees and consents for the procedure. All questions were addressed. A time out was performed.  Maximal barrier sterile technique utilized including caps, mask, sterile gowns, sterile gloves, large sterile drape, hand hygiene, and Betadine.  Previous imaging reviewed. Patient positioned right anterior oblique. Noncontrast localization CT performed. The right iliopsoas abscess was localized with CT. Under sterile conditions and local anesthesia, an 18 gauge 15 cm access needle was advanced from a posterior flank approach into the psoas abscess. Needle position confirmed with CT. Syringe aspiration yielded purulent fluid compatible with an abscess. Guidewire inserted followed by tract dilatation to insert a 12 JamaicaFrench drain. Retention loop formed in the abscess cavity. 200cc blood tinged purulent fluid aspirated. Samples sent for Gram stain and culture. Postprocedure imaging demonstrates significant collapse of the abscess cavity. Catheter secured with a Prolene suture and connected to external suction bulb. Sterile dressing applied. No immediate complication. Patient tolerated the procedure well.  IMPRESSION: Successful CT-guided right psoas abscess drain insertion.   Electronically Signed   By: Ruel Favorsrevor  Shick M.D.   On: 07/30/2013 16:32    Assessment/Plan: Multiple Sclerosis UTI S/p right psoa/perinephric abscess drain placed in IR 6/22, on zosyn and vancomycin, good output, afebrile, wbc WNL Continue to flush drain and monitor output daily Plans per urology    LOS: 2 days    Berneta LevinsMORGAN, KOREEN D PA-C 07/31/2013 2:09 PM

## 2013-07-31 NOTE — Progress Notes (Signed)
Subjective: Patient reports  Resting comfortably at this time.     Objective: Vital signs in last 24 hours: Temp:  [97.3 F (36.3 C)-101.3 F (38.5 C)] 97.3 F (36.3 C) (06/23 0900) Pulse Rate:  [92-111] 94 (06/23 0800) Resp:  [9-22] 15 (06/23 0900) BP: (79-106)/(47-74) 86/47 mmHg (06/23 0900) SpO2:  [90 %-100 %] 100 % (06/23 0800) Weight:  [80.9 kg (178 lb 5.6 oz)] 80.9 kg (178 lb 5.6 oz) (06/23 0400)  Intake/Output from previous day: 06/22 0701 - 06/23 0700 In: 3285.5 [I.V.:2303; Blood:382.5; IV Piggyback:600] Out: 590 [Urine:500; Drains:90] Intake/Output this shift: Total I/O In: 150 [IV Piggyback:150] Out: 1030 [Urine:1000; Drains:30]  Physical Exam:  General:  Abdomen: Soft.  Non distended. Drain: 90 cc Foley draining clear urine. Culture abscess pending.  Lab Results:  Recent Labs  07/29/13 1623 07/30/13 0700 07/31/13 0815  HGB 7.5* 7.1* 8.3*  HCT 26.2* 24.9* 28.3*   BMET  Recent Labs  07/29/13 1623 07/30/13 0700  NA 134* 138  K 3.4* 4.0  CL 97 103  CO2 25 25  GLUCOSE 118* 106*  BUN 9 6  CREATININE 0.63 0.62  CALCIUM 9.0 8.6    Recent Labs  07/29/13 2105  INR 1.22   No results found for this basename: LABURIN,  in the last 72 hours Results for orders placed during the hospital encounter of 07/29/13  URINE CULTURE     Status: None   Collection Time    07/29/13  4:54 PM      Result Value Ref Range Status   Specimen Description URINE, RANDOM   Final   Special Requests NONE   Final   Culture  Setup Time     Final   Value: 07/30/2013 11:10     Performed at [facility] Count     Final   Value: >=100,000 COLONIES/ML     Performed at [facility]   Culture     Final   Value: Multiple bacterial morphotypes present, none predominant. Suggest appropriate recollection if clinically indicated.     Performed at [facility]   Report Status 07/31/2013 FINAL   Final  CULTURE, BLOOD (ROUTINE X 2)     Status:  None   Collection Time    07/29/13  9:03 PM      Result Value Ref Range Status   Specimen Description BLOOD RIGHT HAND   Final   Special Requests BOTTLES DRAWN AEROBIC AND ANAEROBIC 5CC   Final   Culture  Setup Time     Final   Value: 07/30/2013 04:34     Performed at [facility]   Culture     Final   Value:        BLOOD CULTURE RECEIVED NO GROWTH TO DATE CULTURE WILL BE HELD FOR 5 DAYS BEFORE ISSUING A FINAL NEGATIVE REPORT     Performed at [facility]   Report Status PENDING   Incomplete  CULTURE, BLOOD (ROUTINE X 2)     Status: None   Collection Time    07/29/13  9:04 PM      Result Value Ref Range Status   Specimen Description BLOOD RIGHT ARM   Final   Special Requests BOTTLES DRAWN AEROBIC ONLY 3CC   Final   Culture  Setup Time     Final   Value: 07/30/2013 04:34     Performed at [facility]   Culture     Final   Value:  BLOOD CULTURE RECEIVED NO GROWTH TO DATE CULTURE WILL BE HELD FOR 5 DAYS BEFORE ISSUING A FINAL NEGATIVE REPORT     Performed at [facility]   Report Status PENDING   Incomplete  MRSA PCR SCREENING     Status: None   Collection Time    07/29/13 10:53 PM      Result Value Ref Range Status   MRSA by PCR NEGATIVE  NEGATIVE Final   Comment:            The GeneXpert MRSA Assay (FDA     approved for NASAL specimens     only), is one component of a     comprehensive MRSA colonization     surveillance program. It is not     intended to diagnose MRSA     infection nor to guide or     monitor treatment for     MRSA infections.  CULTURE, ROUTINE-ABSCESS     Status: None   Collection Time    07/30/13  3:36 PM      Result Value Ref Range Status   Specimen Description DRAINAGE   Final   Special Requests Normal   Final   Gram Stain     Final   Value: ABUNDANT WBC PRESENT,BOTH PMN AND MONONUCLEAR     NO SQUAMOUS EPITHELIAL CELLS SEEN     FEW GRAM NEGATIVE RODS     Performed at [facility]   Culture     Final    Value: Culture reincubated for better growth     Performed at [facility]   Report Status PENDING   Incomplete  ANAEROBIC CULTURE     Status: None   Collection Time    07/30/13  3:36 PM      Result Value Ref Range Status   Specimen Description ABSCESS   Final   Special Requests Normal   Final   Gram Stain     Final   Value: ABUNDANT WBC PRESENT,BOTH PMN AND MONONUCLEAR     NO SQUAMOUS EPITHELIAL CELLS SEEN     FEW GRAM NEGATIVE RODS     Performed at [facility]   Culture PENDING   Incomplete   Report Status PENDING   Incomplete    Studies/Results: Ct Abdomen Pelvis W Contrast  07/29/2013   ADDENDUM REPORT: 07/29/2013 19:20  ADDENDUM: Xanthogranulomatous pyelonephritis is also in the differential diagnosis for the right renal findings.   Electronically Signed   By: Elige Ko   On: 07/29/2013 19:20   07/29/2013   CLINICAL DATA:  Abdominal pain for 1 week.  Painful urination.  EXAM: CT ABDOMEN AND PELVIS WITH CONTRAST  TECHNIQUE: Multidetector CT imaging of the abdomen and pelvis was performed using the standard protocol following bolus administration of intravenous contrast.  CONTRAST:  OMNIPAQUE IOHEXOL 300 MG/ML SOLN, 50mL OMNIPAQUE IOHEXOL 300 MG/ML SOLN  COMPARISON:  None.  FINDINGS: Right basilar atelectasis.  The liver demonstrates no focal abnormality. There is no intrahepatic or extrahepatic biliary ductal dilatation. The gallbladder is normal. The spleen demonstrates no focal abnormality. The adrenal glands and pancreas are normal.  Severe right nephromegaly with severe right hydronephrosis. There is a 2.3 cm calculus at the right UPJ. Numerous right nephrolithiasis. There is significant right perinephric stranding. There is a 7.6 x 5.8 cm complex fluid collection in the right psoas muscle. There are 2 punctate calcifications at the right UVJ which may reflect recently passed calculi versus soft tissue calcifications. There  is right retroperitoneal  lymphadenopathy with the largest retrocaval lymph node measuring 16.7 mm.  The stomach, duodenum, small intestine, and large intestine demonstrate no contrast extravasation or dilatation. There is no pneumoperitoneum, pneumatosis, or portal venous gas. There is no abdominal or pelvic free fluid. There is no lymphadenopathy. There are injection granulomas in the anterior and posterior subcutaneous fat.  The abdominal aorta is normal in caliber with atherosclerosis.  There are no lytic or sclerotic osseous lesions.  IMPRESSION: 1. Obstructing 2.3 cm right UPJ calculus. Severe right nephromegaly with severe right hydronephrosis and numerous right nephrolithiasis with perinephric stranding. 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 There is a 7.6 x 5.8 cm complex fluid collection in the right psoas muscle concerning for an abscess.  2. There is no contrast seen within the right collecting system on the delayed images consistent with impaired function.  3. There are 2 punctate calcifications at the right UVJ which may reflect recently passed calculi versus bladder wall calcifications.  Urology consultation is recommended in  Electronically Signed: By: Elige KoHetal  Patel On: 07/29/2013 19:13   Ct Image Guided Drainage Percut Cath  Peritoneal Retroperit  07/30/2013   CLINICAL DATA:  Right psoas abscess  EXAM: CT-GUIDED RIGHT PSOAS ABSCESS DRAIN INSERTION  Date:  6/22/20156/22/2015 3:29 PM  Radiologist:  Judie PetitM. Ruel Favorsrevor Shick, MD  Guidance:  CT  FLUOROSCOPY TIME:  NONE.  MEDICATIONS AND MEDICAL HISTORY: 2 MG VERSED, 50 MCG FENTANYL  ANESTHESIA/SEDATION: 15 min  CONTRAST:  None.  COMPLICATIONS: No immediate  PROCEDURE: Informed consent was obtained from the patient following explanation of the procedure, risks, benefits and alternatives. The patient understands, agrees and consents for the procedure. All questions were addressed. A time out was performed.  Maximal barrier sterile technique utilized including  caps, mask, sterile gowns, sterile gloves, large sterile drape, hand hygiene, and Betadine.  Previous imaging reviewed. Patient positioned right anterior oblique. Noncontrast localization CT performed. The right iliopsoas abscess was localized with CT. Under sterile conditions and local anesthesia, an 18 gauge 15 cm access needle was advanced from a posterior flank approach into the psoas abscess. Needle position confirmed with CT. Syringe aspiration yielded purulent fluid compatible with an abscess. Guidewire inserted followed by tract dilatation to insert a 12 [place] drain. Retention loop formed in the abscess cavity. 200cc blood tinged purulent fluid aspirated. Samples sent for Gram stain and culture. Postprocedure imaging demonstrates significant collapse of the abscess cavity. Catheter secured with a Prolene suture and connected to external suction bulb. Sterile dressing applied. No immediate complication. Patient tolerated the procedure well.  IMPRESSION: Successful CT-guided right psoas abscess drain insertion.   Electronically Signed   By: Ruel Favorsrevor  Shick M.D.   On: 07/30/2013 16:32    Assessment/Plan:  Right psoas abscess.  Right nephrolithiasis.  Right hydronephrosis.  Right XGP.  Continue IV antibiotics  Lasix renal scan for differential renal function.   LOS: 2 days   Danae Chenesi, Marc H 07/31/2013, 10:26 AM

## 2013-07-31 NOTE — Progress Notes (Signed)
CRITICAL VALUE ALERT  Critical value received:  Vanc Trough 25.5  Date of notification:  07/31/2013   Time of notification:  1627  Critical value read back:yes  Nurse who received alert:  Dolores Hoose RN  MD notified (1st page):  Verda Cumins in Pharmacy  Time of first page:  1428  Medication was stopped and [facility] stated that subsequent doses would be adjusted. Will continue to monitor.

## 2013-07-31 NOTE — Progress Notes (Signed)
PROGRESS NOTE  LASHAWNTA BURGERT Norman:[id number] DOB: 1970/09/14 DOA: 07/29/2013 PCP: No primary provider on file.  HPI: Jacqueline Norman is a 43 y.o. female with Past medical history of multiple sclerosis with chronic quadriplegia and bedbound status. Patient presented with complaints of abdominal pain that has been ongoing since last one week. She also had an episode of vomiting one week ago and some nausea. She has bowel movements 2 days ago and has chronic constipation at her baseline. She complains of fever but no chills no cough no chest pain no shortness of breath no dizziness no lightheadedness.  She mentions her MS is stable and does not have any new focal neurological deficit.  She denies any hospitalization recently for prior. She denies any history of surgeries on her stomach or abdomen.  The patient is coming from home. And at her baseline dependent for most of her ADL.    Assessment/Plan: Sepsis due to pyelonephritis - meets criteria for sepsis with SBP < 90 and tachycardia plus infection source. CT scan showed obstructing calculus on the right with right hydronephrosis and the psoas muscle abscess. Urology consulted, appreciate input - patient with a psoas abscess on the CT scan, IR consulted s/p drainage on 6/22 when 200 cc of blood tinged purulent fluid was obtained. Cultures sent and pending, continue broad spectrum antibiotics for now.   MS - seems at baseline at this point, closely monitor    Iron deficiency anemia - start on supplementation  - Likely anemia worsened in the setting of sepsis - Hb as low as 7.1 on 6/22, transfused 1U of pRBC, Hb better 6/23 am.   Hypokalemia - Improved after repletion   Right hydronephrosis with nephrolithiasis - urology consulted, appreciate input. Plan for a renal scan to determine function.    Diet: regular  Fluids: NS DVT Prophylaxis: SCD  Code Status: Full Family Communication: d/w husband at bedside   Disposition Plan: inpatient,  remain in SDU today    Consultants:  Urology   IR  Procedures:  None    Antibiotics Vancomycin 6/22 >> Zosyn 6/22>>  Microbiology  6/22 Abscess culture with few GNRs, pending Urine culture 6/21- 100,000 colonies/mL, multiple morphologies, FINAL.  Blood cultures 6/21 - negative, pending.  HPI/Subjective: No complaints, mild abdominal pain  Objective: Filed Vitals:   07/30/13 1925 07/30/13 2025 07/30/13 2053 07/31/13 0400  BP: 87/64 104/64    Pulse:      Temp:      TempSrc:  Core (Comment) Core (Comment)   Resp:      Height:      Weight:    80.9 kg (178 lb 5.6 oz)  SpO2:        Intake/Output Summary (Last 24 hours) at 07/31/13 0738 Last data filed at 07/31/13 0700  Gross per 24 hour  Intake 3285.5 ml  Output    590 ml  Net 2695.5 ml   Filed Weights   07/29/13 2200 07/31/13 0400  Weight: 79 kg (174 lb 2.6 oz) 80.9 kg (178 lb 5.6 oz)   Exam:  General:  NAD  Cardiovascular: regular rate and rhythm, without MRG  Respiratory: good air movement, clear to auscultation throughout, no wheezing, ronchi or rales  Abdomen: soft,  mild tenderness to palpation in the central area. Drain in place with blood tinged fluid and purulent contents.  MSK: no peripheral edema.  Neuro: Bilateral footdrop, no purposeful movement bilaterally upper and lower extremity limited sensation  Data Reviewed: Basic Metabolic Panel:  Recent  Labs Lab 07/29/13 1623 07/30/13 0700  NA 134* 138  K 3.4* 4.0  CL 97 103  CO2 25 25  GLUCOSE 118* 106*  BUN 9 6  CREATININE 0.63 0.62  CALCIUM 9.0 8.6   Liver Function Tests:  Recent Labs Lab 07/29/13 1623 07/30/13 0700  AST 20 18  ALT 9 9  ALKPHOS 83 77  BILITOT 0.6 0.7  PROT 9.4* 8.5*  ALBUMIN 2.1* 1.9*    Recent Labs Lab 07/29/13 1623  LIPASE 24   CBC:  Recent Labs Lab 07/29/13 1623 07/30/13 0700  WBC 10.0 9.9  NEUTROABS 6.3 7.5  HGB 7.5* 7.1*  HCT 26.2* 24.9*  MCV 68.9* 69.4*  PLT 429* 368   Recent  Results (from the past 240 hour(s))  MRSA PCR SCREENING     Status: None   Collection Time    07/29/13 10:53 PM      Result Value Ref Range Status   MRSA by PCR NEGATIVE  NEGATIVE Final   Comment:            The GeneXpert MRSA Assay (FDA     approved for NASAL specimens     only), is one component of a     comprehensive MRSA colonization     surveillance program. It is not     intended to diagnose MRSA     infection nor to guide or     monitor treatment for     MRSA infections.   Studies: Ct Abdomen Pelvis W Contrast  07/29/2013   ADDENDUM REPORT: 07/29/2013 19:20  ADDENDUM: Xanthogranulomatous pyelonephritis is also in the differential diagnosis for the right renal findings.   Electronically Signed   By: Elige Ko   On: 07/29/2013 19:20   07/29/2013   CLINICAL DATA:  Abdominal pain for 1 week.  Painful urination.  EXAM: CT ABDOMEN AND PELVIS WITH CONTRAST  TECHNIQUE: Multidetector CT imaging of the abdomen and pelvis was performed using the standard protocol following bolus administration of intravenous contrast.  CONTRAST:  OMNIPAQUE IOHEXOL 300 MG/ML SOLN, 55mL OMNIPAQUE IOHEXOL 300 MG/ML SOLN  COMPARISON:  None.  FINDINGS: Right basilar atelectasis.  The liver demonstrates no focal abnormality. There is no intrahepatic or extrahepatic biliary ductal dilatation. The gallbladder is normal. The spleen demonstrates no focal abnormality. The adrenal glands and pancreas are normal.  Severe right nephromegaly with severe right hydronephrosis. There is a 2.3 cm calculus at the right UPJ. Numerous right nephrolithiasis. There is significant right perinephric stranding. There is a 7.6 x 5.8 cm complex fluid collection in the right psoas muscle. There are 2 punctate calcifications at the right UVJ which may reflect recently passed calculi versus soft tissue calcifications. There is right retroperitoneal lymphadenopathy with the largest retrocaval lymph node measuring 16.7 mm.  The stomach,  duodenum, small intestine, and large intestine demonstrate no contrast extravasation or dilatation. There is no pneumoperitoneum, pneumatosis, or portal venous gas. There is no abdominal or pelvic free fluid. There is no lymphadenopathy. There are injection granulomas in the anterior and posterior subcutaneous fat.  The abdominal aorta is normal in caliber with atherosclerosis.  There are no lytic or sclerotic osseous lesions.  IMPRESSION: 1. Obstructing 2.3 cm right UPJ calculus. Severe right nephromegaly with severe right hydronephrosis and numerous right nephrolithiasis with perinephric stranding. 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 There is a 7.6 x 5.8 cm complex fluid collection in the right psoas muscle concerning for an abscess.  2. There is no contrast  seen within the right collecting system on the delayed images consistent with impaired function.  3. There are 2 punctate calcifications at the right UVJ which may reflect recently passed calculi versus bladder wall calcifications.  Urology consultation is recommended in  Electronically Signed: By: Elige KoHetal  Patel On: 07/29/2013 19:13   Ct Image Guided Drainage Percut Cath  Peritoneal Retroperit  07/30/2013   CLINICAL DATA:  Right psoas abscess  EXAM: CT-GUIDED RIGHT PSOAS ABSCESS DRAIN INSERTION  Date:  6/22/20156/22/2015 3:29 PM  Radiologist:  Judie PetitM. Ruel Favorsrevor Shick, MD  Guidance:  CT  FLUOROSCOPY TIME:  NONE.  MEDICATIONS AND MEDICAL HISTORY: 2 MG VERSED, 50 MCG FENTANYL  ANESTHESIA/SEDATION: 15 min  CONTRAST:  None.  COMPLICATIONS: No immediate  PROCEDURE: Informed consent was obtained from the patient following explanation of the procedure, risks, benefits and alternatives. The patient understands, agrees and consents for the procedure. All questions were addressed. A time out was performed.  Maximal barrier sterile technique utilized including caps, mask, sterile gowns, sterile gloves, large sterile drape, hand hygiene, and Betadine.   Previous imaging reviewed. Patient positioned right anterior oblique. Noncontrast localization CT performed. The right iliopsoas abscess was localized with CT. Under sterile conditions and local anesthesia, an 18 gauge 15 cm access needle was advanced from a posterior flank approach into the psoas abscess. Needle position confirmed with CT. Syringe aspiration yielded purulent fluid compatible with an abscess. Guidewire inserted followed by tract dilatation to insert a 12 [place] drain. Retention loop formed in the abscess cavity. 200cc blood tinged purulent fluid aspirated. Samples sent for Gram stain and culture. Postprocedure imaging demonstrates significant collapse of the abscess cavity. Catheter secured with a Prolene suture and connected to external suction bulb. Sterile dressing applied. No immediate complication. Patient tolerated the procedure well.  IMPRESSION: Successful CT-guided right psoas abscess drain insertion.   Electronically Signed   By: Ruel Favorsrevor  Shick M.D.   On: 07/30/2013 16:32   Scheduled Meds: . baclofen  10 mg Oral BID  . benztropine  1 mg Oral BID  . levothyroxine  50 mcg Oral QAC breakfast  . piperacillin-tazobactam (ZOSYN)  IV  3.375 g Intravenous Q8H  . sodium chloride  3 mL Intravenous Q12H  . vancomycin  750 mg Intravenous Q8H   Continuous Infusions: . sodium chloride 100 mL/hr at 07/31/13 0150   Principal Problem:   Pyelonephritis Active Problems:   Perinephric abscess   Possible Psoas abscess   Multiple sclerosis   Microcytic anemia   Hypokalemia   Renal stone   Sepsis  Time spent: 25  This note has been created with Education officer, environmentalDragon speech recognition software and smart phrase technology. Any transcriptional errors are unintentional.   Pamella Pertostin Gherghe, MD [facility] Pager [phone]. If 7 PM - 7 AM, please contact night-coverage at [email or web address], password [email or web address] 07/31/2013, 7:38 AM  LOS: 2 days

## 2013-07-31 NOTE — Progress Notes (Signed)
Brief Pharmacy Note: Vancomycin  Vancomycin trough 25.5 this afternoon prior to 5th dose of Vancomycin 750mg  q8 hr. Clearance of Vancomycin less than expected, 1600 dose stopped after ~ infusion of medication  Will adjust dose to 1000mg  and give q12, begin in am.  Thank you, Otho Bellows PharmD Pager [phone] 07/31/2013, 5:35 PM

## 2013-08-01 LAB — CBC
HEMATOCRIT: 27.7 % — AB (ref 36.0–46.0)
Hemoglobin: 8.2 g/dL — ABNORMAL LOW (ref 12.0–15.0)
MCH: 21.1 pg — ABNORMAL LOW (ref 26.0–34.0)
MCHC: 29.6 g/dL — ABNORMAL LOW (ref 30.0–36.0)
MCV: 71.2 fL — AB (ref 78.0–100.0)
Platelets: ADEQUATE 10*3/uL (ref 150–400)
RBC: 3.89 MIL/uL (ref 3.87–5.11)
RDW: 19.9 % — ABNORMAL HIGH (ref 11.5–15.5)
WBC: 10.3 10*3/uL (ref 4.0–10.5)

## 2013-08-01 LAB — BASIC METABOLIC PANEL
BUN: 4 mg/dL — ABNORMAL LOW (ref 6–23)
CALCIUM: 8.5 mg/dL (ref 8.4–10.5)
CHLORIDE: 102 meq/L (ref 96–112)
CO2: 23 meq/L (ref 19–32)
Creatinine, Ser: 0.67 mg/dL (ref 0.50–1.10)
GFR calc Af Amer: >90 mL/min (ref >90)
GFR calc non Af Amer: >90 mL/min (ref >90)
Glucose, Bld: 114 mg/dL — ABNORMAL HIGH (ref 70–99)
Potassium: 3.1 mEq/L — ABNORMAL LOW (ref 3.7–5.3)
Sodium: 136 mEq/L — ABNORMAL LOW (ref 137–147)

## 2013-08-01 MED ORDER — ENSURE COMPLETE PO LIQD
237.0000 mL | Freq: Two times a day (BID) | ORAL | Status: DC
  Administered 2013-08-01 – 2013-08-03 (×5): 237 mL via ORAL

## 2013-08-01 MED ORDER — POTASSIUM CHLORIDE CRYS ER 20 MEQ PO TBCR
40.0000 meq | EXTENDED_RELEASE_TABLET | Freq: Once | ORAL | Status: AC
  Administered 2013-08-01: 40 meq via ORAL
  Filled 2013-08-01: qty 2

## 2013-08-01 MED ORDER — DOCUSATE SODIUM 100 MG PO CAPS
100.0000 mg | ORAL_CAPSULE | Freq: Two times a day (BID) | ORAL | Status: DC
  Administered 2013-08-01 (×2): 100 mg via ORAL
  Filled 2013-08-01 (×4): qty 1

## 2013-08-01 NOTE — Progress Notes (Signed)
Subjective: Patient is more alert today she denies any right flank pain, fever or chills.   Objective: Physical Exam: BP 109/76  Pulse 86  Temp(Src) 97.6 F (36.4 C) (Core (Comment))  Resp 18  Ht 5\' 5"  (1.651 m)  Wt 178 lb 5.6 oz (80.9 kg)  BMI 29.68 kg/m2  SpO2 99%  LMP 06/08/2013  General: NAD, resting in bed  Abd: Soft, NT at drain site, right flank drain site dressing C/D/I, no signs of hematoma, output 24 hours 107 cc, 25 cc purulent bloody fluid in bulb and 200 cc of bloody pus aspirated during procedure. Cx gram negative rods, moderate proteus mirabilis  Labs: CBC  Recent Labs  07/31/13 0815 08/01/13 0340  WBC 9.5 10.3  HGB 8.3* 8.2*  HCT 28.3* 27.7*  PLT 356 PLATELET CLUMPS NOTED ON SMEAR, COUNT APPEARS ADEQUATE   BMET  Recent Labs  07/30/13 0700 08/01/13 0340  NA 138 136*  K 4.0 3.1*  CL 103 102  CO2 25 23  GLUCOSE 106* 114*  BUN 6 4*  CREATININE 0.62 0.67  CALCIUM 8.6 8.5   LFT  Recent Labs  07/29/13 1623 07/30/13 0700  PROT 9.4* 8.5*  ALBUMIN 2.1* 1.9*  AST 20 18  ALT 9 9  ALKPHOS 83 77  BILITOT 0.6 0.7  LIPASE 24  --    PT/INR  Recent Labs  07/29/13 2105  LABPROT 15.1  INR 1.22     Studies/Results: No results found.  Assessment/Plan: Multiple Sclerosis  UTI  S/p right psoa/perinephric abscess drain placed in IR 6/22, on zosyn and vancomycin, good output, afebrile, wbc WNL  Continue to flush drain and monitor output daily  Plans per urology    LOS: 3 days    Cloretta Ned 08/01/2013 3:59 PM

## 2013-08-01 NOTE — Progress Notes (Signed)
Subjective: Patient reports Feels better.  Less abdominal pain.  Objective: Vital signs in last 24 hours: Temp:  [97.6 F (36.4 C)-101.8 F (38.8 C)] 97.6 F (36.4 C) (06/24 1300) Pulse Rate:  [86-98] 86 (06/24 1300) Resp:  [17-25] 18 (06/24 1300) BP: (95-116)/(61-81) 109/76 mmHg (06/24 1300) SpO2:  [97 %-100 %] 99 % (06/24 1300)  Intake/Output from previous day: 06/23 0701 - 06/24 0700 In: 3555 [P.O.:240; I.V.:2300; IV Piggyback:500] Out: 2607 [Urine:2500; Drains:107] Intake/Output this shift: Total I/O In: 1210 [I.V.:1210] Out: 1390 [Urine:1365; Drains:25]  Physical Exam:  General: Abdomen: Soft. Blake draiage: 100 cc. Culture abscess: Proteus mirabilis.  Sensitivity: pending  Lab Results:  Recent Labs  07/30/13 0700 07/31/13 0815 08/01/13 0340  HGB 7.1* 8.3* 8.2*  HCT 24.9* 28.3* 27.7*   BMET  Recent Labs  07/30/13 0700 08/01/13 0340  NA 138 136*  K 4.0 3.1*  CL 103 102  CO2 25 23  GLUCOSE 106* 114*  BUN 6 4*  CREATININE 0.62 0.67  CALCIUM 8.6 8.5    Recent Labs  07/29/13 2105  INR 1.22   No results found for this basename: LABURIN,  in the last 72 hours Results for orders placed during the hospital encounter of 07/29/13  URINE CULTURE     Status: None   Collection Time    07/29/13  4:54 PM      Result Value Ref Range Status   Specimen Description URINE, RANDOM   Final   Special Requests NONE   Final   Culture  Setup Time     Final   Value: 07/30/2013 11:10     Performed at [facility] Count     Final   Value: >=100,000 COLONIES/ML     Performed at [facility]   Culture     Final   Value: Multiple bacterial morphotypes present, none predominant. Suggest appropriate recollection if clinically indicated.     Performed at [facility]   Report Status 07/31/2013 FINAL   Final  CULTURE, BLOOD (ROUTINE X 2)     Status: None   Collection Time    07/29/13  9:03 PM      Result Value Ref Range Status   Specimen Description BLOOD RIGHT HAND   Final   Special Requests BOTTLES DRAWN AEROBIC AND ANAEROBIC 5CC   Final   Culture  Setup Time     Final   Value: 07/30/2013 04:34     Performed at [facility]   Culture     Final   Value:        BLOOD CULTURE RECEIVED NO GROWTH TO DATE CULTURE WILL BE HELD FOR 5 DAYS BEFORE ISSUING A FINAL NEGATIVE REPORT     Performed at [facility]   Report Status PENDING   Incomplete  CULTURE, BLOOD (ROUTINE X 2)     Status: None   Collection Time    07/29/13  9:04 PM      Result Value Ref Range Status   Specimen Description BLOOD RIGHT ARM   Final   Special Requests BOTTLES DRAWN AEROBIC ONLY 3CC   Final   Culture  Setup Time     Final   Value: 07/30/2013 04:34     Performed at [facility]   Culture     Final   Value:        BLOOD CULTURE RECEIVED NO GROWTH TO DATE CULTURE WILL BE HELD FOR 5 DAYS BEFORE ISSUING A FINAL  NEGATIVE REPORT     [facility]   Report Status PENDING   Incomplete  MRSA PCR SCREENING     Status: None   Collection Time    07/29/13 10:53 PM      Result Value Ref Range Status   MRSA by PCR NEGATIVE  NEGATIVE Final   Comment:            The GeneXpert MRSA Assay (FDA     approved for NASAL specimens     only), is one component of a     comprehensive MRSA colonization     surveillance program. It is not     intended to diagnose MRSA     infection nor to guide or     monitor treatment for     MRSA infections.  CULTURE, ROUTINE-ABSCESS     Status: None   Collection Time    07/30/13  3:36 PM      Result Value Ref Range Status   Specimen Description DRAINAGE   Final   Special Requests Normal   Final   Gram Stain     Final   Value: ABUNDANT WBC PRESENT,BOTH PMN AND MONONUCLEAR     NO SQUAMOUS EPITHELIAL CELLS SEEN     FEW GRAM NEGATIVE RODS     [facility]formed at [facility]   Culture     Final   Value: MODERATE PROTEUS MIRABILIS     Performed at [facility]   Report  Status PENDING   Incomplete  ANAEROBIC CULTURE     Status: None   Collection Time    07/30/13  3:36 PM      Result Value Ref Range Status   Specimen Description ABSCESS   Final   Special Requests Normal   Final   Gram Stain     Final   Value: ABUNDANT WBC PRESENT,BOTH PMN AND MONONUCLEAR     NO SQUAMOUS EPITHELIAL CELLS SEEN     FEW GRAM NEGATIVE RODS     [facility]formed at [facility]   Culture     Final   Value: NO ANAEROBES ISOLATED; CULTURE IN PROGRESS FOR 5 DAYS     Performed at [facility]   Report Status PENDING   Incomplete    Studies/Results: No results found.  Assessment/Plan:  Right nephrolithiasis.  Right XGP.  Right hydronephrosis.  Right psoas abscess  Continue antibiotics.  Home when stable.  Will need right nephrectomy when psoas abscess clears up.   LOS: 3 days   Jacqueline Norman, Marc H 08/01/2013, 6:55 PM

## 2013-08-01 NOTE — Progress Notes (Signed)
PROGRESS NOTE  Jacqueline Norman ZOX:[id number] DOB: [birth year] DOA: 07/29/2013 PCP: No primary provider on file.  HPI: Jacqueline Norman is a 43 y.o. female with Past medical history of multiple sclerosis with chronic quadriplegia and bedbound status. Patient presented with complaints of abdominal pain that has been ongoing since last one week. She also had an episode of vomiting one week ago and some nausea. She has bowel movements 2 days ago and has chronic constipation at her baseline. She complains of fever but no chills no cough no chest pain no shortness of breath no dizziness no lightheadedness.  She mentions her MS is stable and does not have any new focal neurological deficit.  She denies any hospitalization recently for prior. She denies any history of surgeries on her stomach or abdomen.  The patient is coming from home. And at her baseline dependent for most of her ADL.    Assessment/Plan: Sepsis due to pyelonephritis - meets criteria for sepsis with SBP < 90 and tachycardia plus infection source. CT scan showed obstructing calculus on the right with right hydronephrosis and the psoas muscle abscess. Urology consulted, appreciate input - patient with a psoas abscess on the CT scan, IR consulted s/p drainage on 6/22 when 200 cc of blood tinged purulent fluid was obtained. Cultures sent and pending, continue broad spectrum antibiotics for now.  Continue with vancomycin and Zosyn day 3, follow up culture.  BP stable.   MS - seems at baseline at this point, closely monitor    Iron deficiency anemia - start on supplementation  - Likely anemia worsened in the setting of sepsis - Hb as low as 7.1 on 6/22, transfused 1U of pRBC. -Hb stable.   Hypokalemia - 40 meq times 2.   Right hydronephrosis with nephrolithiasis - urology consulted, appreciate input. Might need nephrectomy in few weeks, months. No need for renal gram at this time , discussed with Dr Julien Girt. Needs to follow up with Dr  Julien Girt.    Diet: regular  Fluids: NS DVT Prophylaxis: SCD  Code Status: Full Family Communication: d/w husband at bedside   Disposition Plan: transfer to regular bed.    Consultants:  Urology   IR  Procedures:  None    Antibiotics Vancomycin 6/22 >> Zosyn 6/22>>  Microbiology  6/22 Abscess culture with few GNRs, pending Urine culture 6/21- 100,000 colonies/mL, multiple morphologies, FINAL.  Blood cultures 6/21 - negative, pending.  HPI/Subjective: No complaints, no abdominal pain, no BM since admission.   Objective: Filed Vitals:   07/31/13 2000 07/31/13 2100 08/01/13 0000 08/01/13 0400  BP: 101/66 102/61 95/61 113/73  Pulse:    96  Temp: 100.9 F (38.3 C) 98.8 F (37.1 C) 98.1 F (36.7 C) 99.9 F (37.7 C)  TempSrc:      Resp: 22 19 17 24   Height:      Weight:      SpO2: 100%  100% 98%    Intake/Output Summary (Last 24 hours) at 08/01/13 0748 Last data filed at 08/01/13 0604  Gross per 24 hour  Intake   3355 ml  Output   1607 ml  Net   1748 ml   Filed Weights   07/29/13 2200 07/31/13 0400  Weight: 79 kg (174 lb 2.6 oz) 80.9 kg (178 lb 5.6 oz)   Exam:  General:  NAD  Cardiovascular: regular rate and rhythm, without MRG  Respiratory: good air movement, clear to auscultation throughout, no wheezing, ronchi or rales  Abdomen: soft,  mild  tenderness to palpation in the central area. Drain in place with blood tinged fluid.  MSK: no peripheral edema.  Neuro: Bilateral footdrop, no purposeful movement bilaterally upper and lower extremity limited sensation  Data Reviewed: Basic Metabolic Panel:  Recent Labs Lab 07/29/13 1623 07/30/13 0700 08/01/13 0340  NA 134* 138 136*  K 3.4* 4.0 3.1*  CL 97 103 102  CO2 25 25 23   GLUCOSE 118* 106* 114*  BUN 9 6 4*  CREATININE 0.63 0.62 0.67  CALCIUM 9.0 8.6 8.5   Liver Function Tests:  Recent Labs Lab 07/29/13 1623 07/30/13 0700  AST 20 18  ALT 9 9  ALKPHOS 83 77  BILITOT 0.6 0.7  PROT  9.4* 8.5*  ALBUMIN 2.1* 1.9*    Recent Labs Lab 07/29/13 1623  LIPASE 24   CBC:  Recent Labs Lab 07/29/13 1623 07/30/13 0700 07/31/13 0815 08/01/13 0340  WBC 10.0 9.9 9.5 10.3  NEUTROABS 6.3 7.5  --   --   HGB 7.5* 7.1* 8.3* 8.2*  HCT 26.2* 24.9* 28.3* 27.7*  MCV 68.9* 69.4* 70.9* 71.2*  PLT 429* 368 356 PLATELET CLUMPS NOTED ON SMEAR, COUNT APPEARS ADEQUATE   Recent Results (from the past 240 hour(s))  URINE CULTURE     Status: None   Collection Time    07/29/13  4:54 PM      Result Value Ref Range Status   Specimen Description URINE, RANDOM   Final   Special Requests NONE   Final   Culture  Setup Time     Final   Value: 07/30/2013 11:10     Performed at [facility]   Colony Count     Final   Value: >=100,000 COLONIES/ML     [facility]formed [facility]   Culture     Final   Value: Multiple bacterial morphotypes present, none predominant. Suggest appropriate recollection if clinically indicated.     Performed at [facility]   Report Status 07/31/2013 FINAL   Final  CULTURE, BLOOD (ROUTINE X 2)     Status: None   Collection Time    07/29/13  9:03 PM      Result Value Ref Range Status   Specimen Description BLOOD RIGHT HAND   Final   Special Requests BOTTLES DRAWN AEROBIC AND ANAEROBIC 5CC   Final   Culture  Setup Time     Final   Value: 07/30/2013 04:34     [facility]formed at [facility]   Culture     Final   Value:        BLOOD CULTURE RECEIVED NO GROWTH TO DATE CULTURE WILL BE HELD FOR 5 DAYS BEFORE ISSUING A FINAL NEGATIVE REPORT     Performed at [facility]   Report Status PENDING   Incomplete  CULTURE, BLOOD (ROUTINE X 2)     Status: None   Collection Time    07/29/13  9:04 PM      Result Value Ref Range Status   Specimen Description BLOOD RIGHT ARM   Final   Special Requests BOTTLES DRAWN AEROBIC ONLY 3CC   Final   Culture  Setup Time     Final   Value: 07/30/2013 04:34     [facility]formed at [facility]    Culture     Final   Value:        BLOOD CULTURE RECEIVED NO GROWTH TO DATE CULTURE WILL BE HELD FOR 5 DAYS BEFORE ISSUING A FINAL NEGATIVE REPORT  Performed at [facility]   Report Status PENDING   Incomplete  MRSA PCR SCREENING     Status: None   Collection Time    07/29/13 10:53 PM      Result Value Ref Range Status   MRSA by PCR NEGATIVE  NEGATIVE Final   Comment:            The GeneXpert MRSA Assay (FDA     approved for NASAL specimens     only), is one component of a     comprehensive MRSA colonization     surveillance program. It is not     intended to diagnose MRSA     infection nor to guide or     monitor treatment for     MRSA infections.  CULTURE, ROUTINE-ABSCESS     Status: None   Collection Time    07/30/13  3:36 PM      Result Value Ref Range Status   Specimen Description DRAINAGE   Final   Special Requests Normal   Final   Gram Stain     Final   Value: ABUNDANT WBC PRESENT,BOTH PMN AND MONONUCLEAR     NO SQUAMOUS EPITHELIAL CELLS SEEN     FEW GRAM NEGATIVE RODS     Performed at [facility]   Culture     Final   Value: Culture reincubated for better growth     Performed at [facility]   Report Status PENDING   Incomplete  ANAEROBIC CULTURE     Status: None   Collection Time    07/30/13  3:36 PM      Result Value Ref Range Status   Specimen Description ABSCESS   Final   Special Requests Normal   Final   Gram Stain     Final   Value: ABUNDANT WBC PRESENT,BOTH PMN AND MONONUCLEAR     NO SQUAMOUS EPITHELIAL CELLS SEEN     FEW GRAM NEGATIVE RODS     Performed at [facility]   Culture     Final   Value: NO ANAEROBES ISOLATED; CULTURE IN PROGRESS FOR 5 DAYS     Performed at [facility]   Report Status PENDING   Incomplete   Studies: Ct Image Guided Drainage Percut Cath  Peritoneal Retroperit  07/30/2013   CLINICAL DATA:  Right psoas abscess  EXAM: CT-GUIDED RIGHT PSOAS ABSCESS DRAIN INSERTION  Date:   6/22/20156/22/2015 3:29 PM  Radiologist:  Judie Petit. Ruel Favors, MD  Guidance:  CT  FLUOROSCOPY TIME:  NONE.  MEDICATIONS AND MEDICAL HISTORY: 2 MG VERSED, 50 MCG FENTANYL  ANESTHESIA/SEDATION: 15 min  CONTRAST:  None.  COMPLICATIONS: No immediate  PROCEDURE: Informed consent was obtained from the patient following explanation of the procedure, risks, benefits and alternatives. The patient understands, agrees and consents for the procedure. All questions were addressed. A time out was performed.  Maximal barrier sterile technique utilized including caps, mask, sterile gowns, sterile gloves, large sterile drape, hand hygiene, and Betadine.  Previous imaging reviewed. Patient positioned right anterior oblique. Noncontrast localization CT performed. The right iliopsoas abscess was localized with CT. Under sterile conditions and local anesthesia, an 18 gauge 15 cm access needle was advanced from a posterior flank approach into the psoas abscess. Needle position confirmed with CT. Syringe aspiration yielded purulent fluid compatible with an abscess. Guidewire inserted followed by tract dilatation to insert a 12 [place] drain. Retention loop formed in the abscess cavity. 200cc blood tinged purulent fluid  aspirated. Samples sent for Gram stain and culture. Postprocedure imaging demonstrates significant collapse of the abscess cavity. Catheter secured with a Prolene suture and connected to external suction bulb. Sterile dressing applied. No immediate complication. Patient tolerated the procedure well.  IMPRESSION: Successful CT-guided right psoas abscess drain insertion.   Electronically Signed   By: Ruel Favors M.D.   On: 07/30/2013 16:32   Scheduled Meds: . baclofen  10 mg Oral BID  . benztropine  1 mg Oral BID  . ferrous sulfate  325 mg Oral TID WC  . levothyroxine  50 mcg Oral QAC breakfast  . piperacillin-tazobactam (ZOSYN)  IV  3.375 g Intravenous Q8H  . sodium chloride  3 mL Intravenous Q12H  . vancomycin  1,000  mg Intravenous Q12H   Continuous Infusions: . sodium chloride 100 mL/hr at 07/31/13 2156   Principal Problem:   Pyelonephritis Active Problems:   Perinephric abscess   Possible Psoas abscess   Multiple sclerosis   Microcytic anemia   Hypokalemia   Renal stone   Sepsis  Time spent: 25  This note has been created with Education officer, environmental. Any transcriptional errors are unintentional.   Hartley Barefoot, Md.  [facility] Pager [phone]. If 7 PM - 7 AM, please contact night-coverage at [email or web address], password [facility] 08/01/2013, 7:48 AM  LOS: 3 days

## 2013-08-01 NOTE — Progress Notes (Signed)
Received report from ICU RN, Pt arrived unit on stretcher, alert and oriented, able to verbalize needs. Will continue with current plan of care.

## 2013-08-02 LAB — CBC
HEMATOCRIT: 28 % — AB (ref 36.0–46.0)
HEMOGLOBIN: 8.2 g/dL — AB (ref 12.0–15.0)
MCH: 20.8 pg — ABNORMAL LOW (ref 26.0–34.0)
MCHC: 29.3 g/dL — AB (ref 30.0–36.0)
MCV: 70.9 fL — ABNORMAL LOW (ref 78.0–100.0)
Platelets: 393 10*3/uL (ref 150–400)
RBC: 3.95 MIL/uL (ref 3.87–5.11)
RDW: 20.6 % — AB (ref 11.5–15.5)
WBC: 10 10*3/uL (ref 4.0–10.5)

## 2013-08-02 LAB — CULTURE, ROUTINE-ABSCESS: Special Requests: NORMAL

## 2013-08-02 LAB — BASIC METABOLIC PANEL
BUN: 3 mg/dL — ABNORMAL LOW (ref 6–23)
CHLORIDE: 103 meq/L (ref 96–112)
CO2: 20 mEq/L (ref 19–32)
Calcium: 8.8 mg/dL (ref 8.4–10.5)
Creatinine, Ser: 0.64 mg/dL (ref 0.50–1.10)
GFR calc Af Amer: >90 mL/min (ref >90)
GFR calc non Af Amer: >90 mL/min (ref >90)
Glucose, Bld: 83 mg/dL (ref 70–99)
POTASSIUM: 3.9 meq/L (ref 3.7–5.3)
SODIUM: 139 meq/L (ref 137–147)

## 2013-08-02 MED ORDER — INTERFERON BETA-1B 0.3 MG ~~LOC~~ KIT: 0.3000 mg | PACK | SUBCUTANEOUS | Status: DC

## 2013-08-02 NOTE — Progress Notes (Signed)
NUTRITION FOLLOW UP  Intervention:   - Ensure Complete BID - Encouraged continued excellent meal intake - Will continue to monitor   Nutrition Dx:   Inadequate oral intake related to inability to eat as evidenced by npo status - resolved, eating well and diet advanced  New nutrition dx: Increased nutrient needs related to stage 1 and 2 sacral pressure ulcer as evidenced by RN documentation.    Goal:   Intake of meals and supplements to meet >90% estimated needs - met per pt report of recent intake   Monitor:   Weights, labs, intake  Assessment:   Patient with MS and bed bound admitted with sepsis due to pyelonephritis, new right perinephric/psoas abscess for drain placement in IR. Iron deficiency anemia.   6/22: Decreased appetite and intake for the past 6 days and now NPO for procedure. States UBW of 215 lbs but unsure when.  6/25: Diet advanced to regular 6/22 No PO charted since then, however per conversation with pt, she has been eating well Agreeable to trying Ensure this afternoon   Height: Ht Readings from Last 1 Encounters:  07/29/13 5' 5" (1.651 m)    Weight Status:   Wt Readings from Last 1 Encounters:  07/31/13 178 lb 5.6 oz (80.9 kg)    Re-estimated needs:  Kcal: 1700-1800  Protein: 75-85 gm  Fluid: 2L daily   Skin: Non-pitting RLE, LLE edema, stage 1 and 2 sacral pressure ulcer  Diet Order: General   Intake/Output Summary (Last 24 hours) at 08/02/13 1219 Last data filed at 08/02/13 0800  Gross per 24 hour  Intake   2500 ml  Output   2515 ml  Net    -15 ml    Last BM: 6/24   Labs:   Recent Labs Lab 07/30/13 0700 08/01/13 0340 08/02/13 0530  NA 138 136* 139  K 4.0 3.1* 3.9  CL 103 102 103  CO2 _0 BUN 6 4* 3*  CREATININE 0.62 0.67 0.64  CALCIUM 8.6 8.5 8.8  GLUCOSE 106* 114* 83    CBG (last 3)  No results found for this basename: GLUCAP,  in the last 72 hours  Scheduled Meds: . baclofen  10 mg Oral BID  .  benztropine  1 mg Oral BID  . feeding supplement (ENSURE COMPLETE)  237 mL Oral BID BM  . ferrous sulfate  325 mg Oral TID WC  . levothyroxine  50 mcg Oral QAC breakfast  . piperacillin-tazobactam (ZOSYN)  IV  3.375 g Intravenous Q8H  . sodium chloride  3 mL Intravenous Q12H  . vancomycin  1,000 mg Intravenous Q12H    Continuous Infusions: . sodium chloride 100 mL/hr at 08/02/13 [place] MS, RD, LDN [phone] Pager [phone] Weekend/After Hours Pager

## 2013-08-02 NOTE — Progress Notes (Signed)
Subjective: Patient feels ok today  Objective: Physical Exam: BP 112/78  Pulse 96  Temp(Src) 99.4 F (37.4 C) (Oral)  Resp 18  Ht 5\' 5"  (1.651 m)  Wt 178 lb 5.6 oz (80.9 kg)  BMI 29.68 kg/m2  SpO2 99%  LMP 06/08/2013  General: NAD Abd: Soft, NT at drain site, right flank drain site dressing C/D/I, no signs of hematoma,  Output 70cc yesterday  Labs: CBC  Recent Labs  08/01/13 0340 08/02/13 0530  WBC 10.3 10.0  HGB 8.2* 8.2*  HCT 27.7* 28.0*  PLT PLATELET CLUMPS NOTED ON SMEAR, COUNT APPEARS ADEQUATE 393   BMET  Recent Labs  08/01/13 0340 08/02/13 0530  NA 136* 139  K 3.1* 3.9  CL 102 103  CO2 23 20  GLUCOSE 114* 83  BUN 4* 3*  CREATININE 0.67 0.64  CALCIUM 8.5 8.8    Studies/Results: No results found.  Assessment/Plan: Multiple Sclerosis  UTI  S/p right psoa/perinephric abscess drain placed in IR 6/22, on zosyn and vancomycin, good output, afebrile, wbc WNL  Continue to flush drain and monitor output daily  Plans per urology    LOS: 4 days    Brayton El PA-C 08/02/2013 3:08 PM

## 2013-08-02 NOTE — Progress Notes (Signed)
Subjective: Patient reports Feels better.  No pain.  Objective: Vital signs in last 24 hours: Temp:  [97.5 F (36.4 C)-100.3 F (37.9 C)] 99.4 F (37.4 C) (06/25 1415) Pulse Rate:  [91-96] 96 (06/25 1415) Resp:  [18-20] 18 (06/25 1415) BP: (101-112)/(68-78) 112/78 mmHg (06/25 1415) SpO2:  [98 %-99 %] 99 % (06/25 1415)  Intake/Output from previous day: 06/24 0701 - 06/25 0700 In: 2910 [I.V.:2910] Out: 2235 [Urine:2165; Drains:70] Intake/Output this shift: Total I/O In: 755 [I.V.:700; Other:5; IV Piggyback:50] Out: 1870 [Urine:1850; Drains:20]  Physical Exam:  General:  Abdomen: Soft, non tender. Drainage abscess decreasing: 45 cc. Culture abscess: Proteus pansensitive.  Lab Results:  Recent Labs  07/31/13 0815 08/01/13 0340 08/02/13 0530  HGB 8.3* 8.2* 8.2*  HCT 28.3* 27.7* 28.0*   BMET  Recent Labs  08/01/13 0340 08/02/13 0530  NA 136* 139  K 3.1* 3.9  CL 102 103  CO2 23 20  GLUCOSE 114* 83  BUN 4* 3*  CREATININE 0.67 0.64  CALCIUM 8.5 8.8   No results found for this basename: LABPT, INR,  in the last 72 hours No results found for this basename: LABURIN,  in the last 72 hours Results for orders placed during the hospital encounter of 07/29/13  URINE CULTURE     Status: None   Collection Time    07/29/13  4:54 PM      Result Value Ref Range Status   Specimen Description URINE, RANDOM   Final   Special Requests NONE   Final   Culture  Setup Time     Final   Value: 07/30/2013 11:10     Performed at [facility] Count     Final   Value: >=100,000 COLONIES/ML     Performed at [facility]   Culture     Final   Value: Multiple bacterial morphotypes present, none predominant. Suggest appropriate recollection if clinically indicated.     Performed at [facility]   Report Status 07/31/2013 FINAL   Final  CULTURE, BLOOD (ROUTINE X 2)     Status: None   Collection Time    07/29/13  9:03 PM      Result Value Ref Range  Status   Specimen Description BLOOD RIGHT HAND   Final   Special Requests BOTTLES DRAWN AEROBIC AND ANAEROBIC 5CC   Final   Culture  Setup Time     Final   Value: 07/30/2013 04:34     Performed at [facility]   Culture     Final   Value:        BLOOD CULTURE RECEIVED NO GROWTH TO DATE CULTURE WILL BE HELD FOR 5 DAYS BEFORE ISSUING A FINAL NEGATIVE REPORT     Performed at [facility]   Report Status PENDING   Incomplete  CULTURE, BLOOD (ROUTINE X 2)     Status: None   Collection Time    07/29/13  9:04 PM      Result Value Ref Range Status   Specimen Description BLOOD RIGHT ARM   Final   Special Requests BOTTLES DRAWN AEROBIC ONLY 3CC   Final   Culture  Setup Time     Final   Value: 07/30/2013 04:34     Performed at [facility]   Culture     Final   Value:        BLOOD CULTURE RECEIVED NO GROWTH TO DATE CULTURE WILL BE HELD FOR 5 DAYS  BEFORE ISSUING A FINAL NEGATIVE REPORT     [facility]formed at [facility]   Report Status PENDING   Incomplete  MRSA PCR SCREENING     Status: None   Collection Time    07/29/13 10:53 PM      Result Value Ref Range Status   MRSA by PCR NEGATIVE  NEGATIVE Final   Comment:            The GeneXpert MRSA Assay (FDA     approved for NASAL specimens     only), is one component of a     comprehensive MRSA colonization     surveillance program. It is not     intended to diagnose MRSA     infection nor to guide or     monitor treatment for     MRSA infections.  CULTURE, ROUTINE-ABSCESS     Status: None   Collection Time    07/30/13  3:36 PM      Result Value Ref Range Status   Specimen Description DRAINAGE   Final   Special Requests Normal   Final   Gram Stain     Final   Value: ABUNDANT WBC PRESENT,BOTH PMN AND MONONUCLEAR     NO SQUAMOUS EPITHELIAL CELLS SEEN     FEW GRAM NEGATIVE RODS     Performed at [facility]   Culture     Final   Value: MODERATE PROTEUS MIRABILIS     Performed at [facility]    Report Status 08/02/2013 FINAL   Final   Organism ID, Bacteria PROTEUS MIRABILIS   Final  ANAEROBIC CULTURE     Status: None   Collection Time    07/30/13  3:36 PM      Result Value Ref Range Status   Specimen Description ABSCESS   Final   Special Requests Normal   Final   Gram Stain     Final   Value: ABUNDANT WBC PRESENT,BOTH PMN AND MONONUCLEAR     NO SQUAMOUS EPITHELIAL CELLS SEEN     FEW GRAM NEGATIVE RODS     Performed at [facility]   Culture     Final   Value: NO ANAEROBES ISOLATED; CULTURE IN PROGRESS FOR 5 DAYS     Performed at [facility]   Report Status PENDING   Incomplete    Studies/Results: No results found.  Assessment/Plan:  Right XGP.  Right nephrolithiasis.  Right hydronephrosis.  Can go home on oral antibiotics.  OK to remove Foley before discharge   LOS: 4 days   Jacqueline Norman, Marc H 08/02/2013, 6:54 PM

## 2013-08-02 NOTE — Progress Notes (Signed)
PROGRESS NOTE  Jacqueline Norman HLK:[id number] DOB: 30-Jul-1970 DOA: 07/29/2013 PCP: No primary provider on file.  HPI: Jacqueline Norman is a 42 y.o. female with Past medical history of multiple sclerosis with chronic quadriplegia and bedbound status. Patient presented with complaints of abdominal pain that has been ongoing since last one week. She also had an episode of vomiting one week ago and some nausea. She has bowel movements 2 days ago and has chronic constipation at her baseline. She complains of fever but no chills no cough no chest pain no shortness of breath no dizziness no lightheadedness.  She mentions her MS is stable and does not have any new focal neurological deficit.  She denies any hospitalization recently for prior. She denies any history of surgeries on her stomach or abdomen.  The patient is coming from home. And at her baseline dependent for most of her ADL.    Assessment/Plan: Sepsis due to pyelonephritis - meets criteria for sepsis with SBP < 90 and tachycardia plus infection source. CT scan showed obstructing calculus on the right with right hydronephrosis and the psoas muscle abscess. Urology consulted, appreciate input - patient with a psoas abscess on the CT scan, IR consulted s/p drainage on 6/22 when 200 cc of blood tinged purulent fluid was obtained. Cultures sent and pending, continue broad spectrum antibiotics for now.  Continue with vancomycin and Zosyn day 4, follow up culture, preliminary growing proteus.  BP stable.  Had mild fever yesterday night.   MS - seems at baseline at this point, closely monitor    Iron deficiency anemia - start on supplementation  - Likely anemia worsened in the setting of sepsis - Hb as low as 7.1 on 6/22, transfused 1U of pRBC. -Hb stable.   Hypokalemia - resolved.   Right hydronephrosis with nephrolithiasis - urology consulted, appreciate input. Might need nephrectomy in few weeks, months. No need for renal gram at this time ,  discussed with Dr Julien Girt. Needs to follow up with Dr Julien Girt.   Constipation; had multiple BM with laxatives. Hold Laxatives.   Diet: regular  Fluids: NS DVT Prophylaxis: SCD  Code Status: Full Family Communication: d/w husband at bedside   Disposition Plan: home in 24 to 48 hours if afebrile.    Consultants:  Urology   IR  Procedures:  None    Antibiotics Vancomycin 6/22 >> Zosyn 6/22>>  Microbiology  6/22 Abscess culture with few GNRs, proteus.  Urine culture 6/21- 100,000 colonies/mL, multiple morphologies, FINAL.  Blood cultures 6/21 - negative, pending.  HPI/Subjective: No complaints, no abdominal pain, no BM since admission.   Objective: Filed Vitals:   08/01/13 1100 08/01/13 1300 08/01/13 2123 08/02/13 0655  BP: 116/81 109/76 102/71 101/68  Pulse: 89 86 92 91  Temp: 97.6 F (36.4 C) 97.6 F (36.4 C) 100.3 F (37.9 C) 97.5 F (36.4 C)  TempSrc:   Axillary Oral  Resp:  18 18 20   Height:      Weight:      SpO2: 100% 99% 98% 98%    Intake/Output Summary (Last 24 hours) at 08/02/13 0745 Last data filed at 08/02/13 0700  Gross per 24 hour  Intake   2910 ml  Output   2235 ml  Net    675 ml   Filed Weights   07/29/13 2200 07/31/13 0400  Weight: 79 kg (174 lb 2.6 oz) 80.9 kg (178 lb 5.6 oz)   Exam:  General:  NAD  Cardiovascular: regular rate and rhythm,  without MRG  Respiratory: good air movement, clear to auscultation throughout, no wheezing, ronchi or rales  Abdomen: soft,  mild tenderness to palpation in the central area. Drain in place with blood tinged fluid.  MSK: no peripheral edema.  Neuro: Bilateral footdrop, no purposeful movement bilaterally upper and lower extremity limited sensation  Data Reviewed: Basic Metabolic Panel:  Recent Labs Lab 07/29/13 1623 07/30/13 0700 08/01/13 0340 08/02/13 0530  NA 134* 138 136* 139  K 3.4* 4.0 3.1* 3.9  CL 97 103 102 103  CO2 25 25 23 20   GLUCOSE 118* 106* 114* 83  BUN 9 6 4* 3*    CREATININE 0.63 0.62 0.67 0.64  CALCIUM 9.0 8.6 8.5 8.8   Liver Function Tests:  Recent Labs Lab 07/29/13 1623 07/30/13 0700  AST 20 18  ALT 9 9  ALKPHOS 83 77  BILITOT 0.6 0.7  PROT 9.4* 8.5*  ALBUMIN 2.1* 1.9*    Recent Labs Lab 07/29/13 1623  LIPASE 24   CBC:  Recent Labs Lab 07/29/13 1623 07/30/13 0700 07/31/13 0815 08/01/13 0340 08/02/13 0530  WBC 10.0 9.9 9.5 10.3 10.0  NEUTROABS 6.3 7.5  --   --   --   HGB 7.5* 7.1* 8.3* 8.2* 8.2*  HCT 26.2* 24.9* 28.3* 27.7* 28.0*  MCV 68.9* 69.4* 70.9* 71.2* 70.9*  PLT 429* 368 356 PLATELET CLUMPS NOTED ON SMEAR, COUNT APPEARS ADEQUATE 393   Recent Results (from the past 240 hour(s))  URINE CULTURE     Status: None   Collection Time    07/29/13  4:54 PM      Result Value Ref Range Status   Specimen Description URINE, RANDOM   Final   Special Requests NONE   Final   Culture  Setup Time     Final   Value: 07/30/2013 11:10     Performed at [facility] Count     Final   Value: >=100,000 COLONIES/ML     Performed at [facility]   Culture     Final   Value: Multiple bacterial morphotypes present, none predominant. Suggest appropriate recollection if clinically indicated.     Performed at [facility]   Report Status 07/31/2013 FINAL   Final  CULTURE, BLOOD (ROUTINE X 2)     Status: None   Collection Time    07/29/13  9:03 PM      Result Value Ref Range Status   Specimen Description BLOOD RIGHT HAND   Final   Special Requests BOTTLES DRAWN AEROBIC AND ANAEROBIC 5CC   Final   Culture  Setup Time     Final   Value: 07/30/2013 04:34     Performed at [facility]   Culture     Final   Value:        BLOOD CULTURE RECEIVED NO GROWTH TO DATE CULTURE WILL BE HELD FOR 5 DAYS BEFORE ISSUING A FINAL NEGATIVE REPORT     Performed at [facility]   Report Status PENDING   Incomplete  CULTURE, BLOOD (ROUTINE X 2)     Status: None   Collection Time    07/29/13  9:04 PM       Result Value Ref Range Status   Specimen Description BLOOD RIGHT ARM   Final   Special Requests BOTTLES DRAWN AEROBIC ONLY 3CC   Final   Culture  Setup Time     Final   Value: 07/30/2013 04:34     Performed  at [facility]   Culture     Final   Value:        BLOOD CULTURE RECEIVED NO GROWTH TO DATE CULTURE WILL BE HELD FOR 5 DAYS BEFORE ISSUING A FINAL NEGATIVE REPORT     Performed at [facility]   Report Status PENDING   Incomplete  MRSA PCR SCREENING     Status: None   Collection Time    07/29/13 10:53 PM      Result Value Ref Range Status   MRSA by PCR NEGATIVE  NEGATIVE Final   Comment:            The GeneXpert MRSA Assay (FDA     approved for NASAL specimens     only), is one component of a     comprehensive MRSA colonization     surveillance program. It is not     intended to diagnose MRSA     infection nor to guide or     monitor treatment for     MRSA infections.  CULTURE, ROUTINE-ABSCESS     Status: None   Collection Time    07/30/13  3:36 PM      Result Value Ref Range Status   Specimen Description DRAINAGE   Final   Special Requests Normal   Final   Gram Stain     Final   Value: ABUNDANT WBC PRESENT,BOTH PMN AND MONONUCLEAR     NO SQUAMOUS EPITHELIAL CELLS SEEN     FEW GRAM NEGATIVE RODS     Performed at [facility]   Culture     Final   Value: MODERATE PROTEUS MIRABILIS     Performed at [facility]   Report Status PENDING   Incomplete  ANAEROBIC CULTURE     Status: None   Collection Time    07/30/13  3:36 PM      Result Value Ref Range Status   Specimen Description ABSCESS   Final   Special Requests Normal   Final   Gram Stain     Final   Value: ABUNDANT WBC PRESENT,BOTH PMN AND MONONUCLEAR     NO SQUAMOUS EPITHELIAL CELLS SEEN     FEW GRAM NEGATIVE RODS     Performed at [facility]   Culture     Final   Value: NO ANAEROBES ISOLATED; CULTURE IN PROGRESS FOR 5 DAYS     Performed at [facility]    Report Status PENDING   Incomplete   Studies: No results found. Scheduled Meds: . baclofen  10 mg Oral BID  . benztropine  1 mg Oral BID  . docusate sodium  100 mg Oral BID  . feeding supplement (ENSURE COMPLETE)  237 mL Oral BID BM  . ferrous sulfate  325 mg Oral TID WC  . levothyroxine  50 mcg Oral QAC breakfast  . piperacillin-tazobactam (ZOSYN)  IV  3.375 g Intravenous Q8H  . sodium chloride  3 mL Intravenous Q12H  . vancomycin  1,000 mg Intravenous Q12H   Continuous Infusions: . sodium chloride 100 mL/hr at 08/01/13 2222   Principal Problem:   Pyelonephritis Active Problems:   Perinephric abscess   Possible Psoas abscess   Multiple sclerosis   Microcytic anemia   Hypokalemia   Renal stone   Sepsis  Time spent: 25  This note has been created with Education officer, environmentalDragon speech recognition software and smart phrase technology. Any transcriptional errors are unintentional.   Hartley BarefootBelkys Regalado, Md.  [facility] Pager [phone]. If 7 PM - 7 AM, please contact night-coverage at [email or web address], password [facility] 08/02/2013, 7:45 AM  LOS: 4 days

## 2013-08-03 LAB — BASIC METABOLIC PANEL
BUN: 3 mg/dL — ABNORMAL LOW (ref 6–23)
CHLORIDE: 102 meq/L (ref 96–112)
CO2: 24 meq/L (ref 19–32)
Calcium: 8.7 mg/dL (ref 8.4–10.5)
Creatinine, Ser: 0.73 mg/dL (ref 0.50–1.10)
GFR calc non Af Amer: >90 mL/min (ref >90)
Glucose, Bld: 92 mg/dL (ref 70–99)
Potassium: 3 mEq/L — ABNORMAL LOW (ref 3.7–5.3)
Sodium: 137 mEq/L (ref 137–147)

## 2013-08-03 MED ORDER — POTASSIUM CHLORIDE CRYS ER 20 MEQ PO TBCR
40.0000 meq | EXTENDED_RELEASE_TABLET | Freq: Once | ORAL | Status: AC
Start: 2013-08-03 — End: 2013-08-03
  Administered 2013-08-03: 40 meq via ORAL
  Filled 2013-08-03: qty 2

## 2013-08-03 MED ORDER — INTERFERON BETA-1B 0.3 MG ~~LOC~~ KIT
0.3000 mg | PACK | SUBCUTANEOUS | Status: DC
  Administered 2013-08-03: 0.3 mg via SUBCUTANEOUS

## 2013-08-03 NOTE — Progress Notes (Signed)
PROGRESS NOTE  Jacqueline Norman ZOX:[id number] DOB: 1970-06-12 DOA: 07/29/2013 PCP: No primary  on file.  HPI: Jacqueline Norman is a 43 y.o. female with Past medical history of multiple sclerosis with chronic quadriplegia and bedbound status. Patient presented with complaints of abdominal pain that has been ongoing since last one week. She also had an episode of vomiting one week ago and some nausea. She has bowel movements 2 days ago and has chronic constipation at her baseline. She complains of fever but no chills no cough no chest pain no shortness of breath no dizziness no lightheadedness.  She mentions her MS is stable and does not have any new focal neurological deficit.  She denies any hospitalization recently for prior. She denies any history of surgeries on her stomach or abdomen.  The patient is coming from home. And at her baseline dependent for most of her ADL.    Assessment/Plan: Sepsis due to pyelonephritis - meets criteria for sepsis with SBP < 90 and tachycardia plus infection source. CT scan showed obstructing calculus on the right with right hydronephrosis and the psoas muscle abscess. Urology consulted, appreciate input - patient with a psoas abscess on the CT scan, IR consulted s/p drainage on 6/22 when 200 cc of blood tinged purulent fluid was obtained. Cultures sent and pending, continue broad spectrum antibiotics for now.  Continue with vancomycin and Zosyn day 5, follow up culture, preliminary growing proteus, sensitive to Zosyn.   BP stable.  Still with mild fever. Will monitor. Will check for C diff.   MS - seems at baseline at this point, closely monitor . Resume interferon.   Iron deficiency anemia - start on supplementation  - Likely anemia worsened in the setting of sepsis - Hb as low as 7.1 on 6/22, transfused 1U of pRBC. -Hb stable.   Hypokalemia - replete.   Right hydronephrosis with nephrolithiasis - urology consulted, appreciate input. Might need  nephrectomy in few weeks, months. No need for renal gram at this time , discussed with Dr Julien Girt. Needs to follow up with Dr Julien Girt.   Constipation; had multiple BM with laxatives. Hold Laxatives. Now with diarrhea. check for C diff.   Diet: regular  Fluids: NS DVT Prophylaxis: SCD  Code Status: Full Family Communication: d/w patient.  Disposition Plan: home in 24 to 48 hours if afebrile.    Consultants:  Urology   IR  Procedures:  None    Antibiotics Vancomycin 6/22 >> Zosyn 6/22>>  Microbiology  6/22 Abscess culture with few GNRs, proteus.  Urine culture 6/21- 100,000 colonies/mL, multiple morphologies, FINAL.  Blood cultures 6/21 - negative, pending.  HPI/Subjective: No complaints, no abdominal pain. Relates multiple BM, 1 or 2 BM.   Objective: Filed Vitals:   08/02/13 0655 08/02/13 1415 08/02/13 2138 08/03/13 0538  BP: 101/68 112/78 94/65 100/69  Pulse: 91 96 104 92  Temp: 97.5 F (36.4 C) 99.4 F (37.4 C) 100 F (37.8 C) 99.5 F (37.5 C)  TempSrc: Oral Oral Axillary Axillary  Resp: 20 18 19 18   Height:      Weight:    84.641 kg (186 lb 9.6 oz)  SpO2: 98% 99% 97% 99%    Intake/Output Summary (Last 24 hours) at 08/03/13 1246 Last data filed at 08/03/13 1233  Gross per 24 hour  Intake   2789 ml  Output   2790 ml  Net     -1 ml   Filed Weights   07/29/13 2200 07/31/13 0400 08/03/13 0538  Weight: 79 kg (174 lb 2.6 oz) 80.9 kg (178 lb 5.6 oz) 84.641 kg (186 lb 9.6 oz)   Exam:  General:  NAD  Cardiovascular: regular rate and rhythm, without MRG  Respiratory: good air movement, clear to auscultation throughout, no wheezing, ronchi or rales  Abdomen: soft,  mild tenderness to palpation in the central area. Drain in place with blood tinged fluid.  MSK: no peripheral edema.  Neuro: Bilateral footdrop, no purposeful movement bilaterally upper and lower extremity limited sensation  Data Reviewed: Basic Metabolic Panel:  Recent Labs Lab  07/29/13 1623 07/30/13 0700 08/01/13 0340 08/02/13 0530 08/03/13 0515  NA 134* 138 136* 139 137  K 3.4* 4.0 3.1* 3.9 3.0*  CL 97 103 102 103 102  CO2 25 25 23 20 24   GLUCOSE 118* 106* 114* 83 92  BUN 9 6 4* 3* 3*  CREATININE 0.63 0.62 0.67 0.64 0.73  CALCIUM 9.0 8.6 8.5 8.8 8.7   Liver Function Tests:  Recent Labs Lab 07/29/13 1623 07/30/13 0700  AST 20 18  ALT 9 9  ALKPHOS 83 77  BILITOT 0.6 0.7  PROT 9.4* 8.5*  ALBUMIN 2.1* 1.9*    Recent Labs Lab 07/29/13 1623  LIPASE 24   CBC:  Recent Labs Lab 07/29/13 1623 07/30/13 0700 07/31/13 0815 08/01/13 0340 08/02/13 0530  WBC 10.0 9.9 9.5 10.3 10.0  NEUTROABS 6.3 7.5  --   --   --   HGB 7.5* 7.1* 8.3* 8.2* 8.2*  HCT 26.2* 24.9* 28.3* 27.7* 28.0*  MCV 68.9* 69.4* 70.9* 71.2* 70.9*  PLT 429* 368 356 PLATELET CLUMPS NOTED ON SMEAR, COUNT APPEARS ADEQUATE 393   Recent Results (from the past 240 hour(s))  URINE CULTURE     Status: None   Collection Time    07/29/13  4:54 PM      Result Value Ref Range Status   Specimen Description URINE, RANDOM   Final   Special Requests NONE   Final   Culture  Setup Time     Final   Value: 07/30/2013 11:10     Performed at [facility] Count     Final   Value: >=100,000 COLONIES/ML     Performed at [facility]   Culture     Final   Value: Multiple bacterial morphotypes present, none predominant. Suggest appropriate recollection if clinically indicated.     Performed at [facility]   Report Status 07/31/2013 FINAL   Final  CULTURE, BLOOD (ROUTINE X 2)     Status: None   Collection Time    07/29/13  9:03 PM      Result Value Ref Range Status   Specimen Description BLOOD RIGHT HAND   Final   Special Requests BOTTLES DRAWN AEROBIC AND ANAEROBIC 5CC   Final   Culture  Setup Time     Final   Value: 07/30/2013 04:34     Performed at [facility]   Culture     Final   Value:        BLOOD CULTURE RECEIVED NO GROWTH TO DATE CULTURE  WILL BE HELD FOR 5 DAYS BEFORE ISSUING A FINAL NEGATIVE REPORT     Performed at [facility]   Report Status PENDING   Incomplete  CULTURE, BLOOD (ROUTINE X 2)     Status: None   Collection Time    07/29/13  9:04 PM      Result Value Ref Range Status  Specimen Description BLOOD RIGHT ARM   Final   Special Requests BOTTLES DRAWN AEROBIC ONLY 3CC   Final   Culture  Setup Time     Final   Value: 07/30/2013 04:34     Performed at [facility]   Culture     Final   Value:        BLOOD CULTURE RECEIVED NO GROWTH TO DATE CULTURE WILL BE HELD FOR 5 DAYS BEFORE ISSUING A FINAL NEGATIVE REPORT     Performed at [facility]   Report Status PENDING   Incomplete  MRSA PCR SCREENING     Status: None   Collection Time    07/29/13 10:53 PM      Result Value Ref Range Status   MRSA by PCR NEGATIVE  NEGATIVE Final   Comment:            The GeneXpert MRSA Assay (FDA     approved for NASAL specimens     only), is one component of a     comprehensive MRSA colonization     surveillance program. It is not     intended to diagnose MRSA     infection nor to guide or     monitor treatment for     MRSA infections.  CULTURE, ROUTINE-ABSCESS     Status: None   Collection Time    07/30/13  3:36 PM      Result Value Ref Range Status   Specimen Description DRAINAGE   Final   Special Requests Normal   Final   Gram Stain     Final   Value: ABUNDANT WBC PRESENT,BOTH PMN AND MONONUCLEAR     NO SQUAMOUS EPITHELIAL CELLS SEEN     FEW GRAM NEGATIVE RODS     Performed at [facility]   Culture     Final   Value: MODERATE PROTEUS MIRABILIS     Performed at [facility]   Report Status 08/02/2013 FINAL   Final   Organism ID, Bacteria PROTEUS MIRABILIS   Final  ANAEROBIC CULTURE     Status: None   Collection Time    07/30/13  3:36 PM      Result Value Ref Range Status   Specimen Description ABSCESS   Final   Special Requests Normal   Final   Gram Stain     Final    Value: ABUNDANT WBC PRESENT,BOTH PMN AND MONONUCLEAR     NO SQUAMOUS EPITHELIAL CELLS SEEN     FEW GRAM NEGATIVE RODS     Performed at [facility]   Culture     Final   Value: NO ANAEROBES ISOLATED; CULTURE IN PROGRESS FOR 5 DAYS     Performed at [facility]   Report Status PENDING   Incomplete   Studies: No results found. Scheduled Meds: . baclofen  10 mg Oral BID  . benztropine  1 mg Oral BID  . feeding supplement (ENSURE COMPLETE)  237 mL Oral BID BM  . ferrous sulfate  325 mg Oral TID WC  . Interferon Beta-1b  0.3 mg Subcutaneous QODAY  . levothyroxine  50 mcg Oral QAC breakfast  . piperacillin-tazobactam (ZOSYN)  IV  3.375 g Intravenous Q8H  . sodium chloride  3 mL Intravenous Q12H   Continuous Infusions: . sodium chloride 100 mL/hr at 08/03/13 1037   Principal Problem:   Pyelonephritis Active Problems:   Perinephric abscess   Possible Psoas abscess   Multiple sclerosis  Microcytic anemia   Hypokalemia   Renal stone   Sepsis  Time spent: 25  This note has been created with Education officer, environmentalDragon speech recognition software and smart phrase technology. Any transcriptional errors are unintentional.   Hartley BarefootBelkys Regalado, Md.  [facility] Pager [phone]. If 7 PM - 7 AM, please contact night-coverage at [email or web address], password [email or web address] 08/03/2013, 12:46 PM  LOS: 5 days

## 2013-08-03 NOTE — Progress Notes (Signed)
ANTIBIOTIC CONSULT NOTE - FOLLOW UP  Pharmacy Consult for Zosyn Indication: Sepsis, pyelonephritis  No Known Allergies  Patient Measurements: Height: 5\' 5"  (165.1 cm) Weight: 186 lb 9.6 oz (84.641 kg) IBW/kg (Calculated) : 57  Vital Signs: Temp: 99.5 F (37.5 C) (06/26 0538) Temp src: Axillary (06/26 0538) BP: 100/69 mmHg (06/26 0538) Pulse Rate: 92 (06/26 0538) Intake/Output from previous day: 06/25 0701 - 06/26 0700 In: 2539 [I.V.:2379; IV Piggyback:150] Out: 3285 [Urine:3225; Drains:60] Intake/Output from this shift:    Labs:  Recent Labs  07/31/13 0815 08/01/13 0340 08/02/13 0530 08/03/13 0515  WBC 9.5 10.3 10.0  --   HGB 8.3* 8.2* 8.2*  --   PLT 356 PLATELET CLUMPS NOTED ON SMEAR, COUNT APPEARS ADEQUATE 393  --   CREATININE  --  0.67 0.64 0.73   Estimated Creatinine Clearance: 98.3 ml/min (by C-G formula based on Cr of 0.73).  Recent Labs  07/31/13 1525  VANCOTROUGH 25.5*     Microbiology: Recent Results (from the past 720 hour(s))  URINE CULTURE     Status: None   Collection Time    07/29/13  4:54 PM      Result Value Ref Range Status   Specimen Description URINE, RANDOM   Final   Special Requests NONE   Final   Culture  Setup Time     Final   Value: 07/30/2013 11:10     Performed at [facility]   Colony Count     Final   Value: >=100,000 COLONIES/ML     Performed at [facility]   Culture     Final   Value: Multiple bacterial morphotypes present, none predominant. Suggest appropriate recollection if clinically indicated.     Performed at [facility]   Report Status 07/31/2013 FINAL   Final  CULTURE, BLOOD (ROUTINE X 2)     Status: None   Collection Time    07/29/13  9:03 PM      Result Value Ref Range Status   Specimen Description BLOOD RIGHT HAND   Final   Special Requests BOTTLES DRAWN AEROBIC AND ANAEROBIC 5CC   Final   Culture  Setup Time     Final   Value: 07/30/2013 04:34     Performed at [facility]   Culture     Final   Value:        BLOOD CULTURE RECEIVED NO GROWTH TO DATE CULTURE WILL BE HELD FOR 5 DAYS BEFORE ISSUING A FINAL NEGATIVE REPORT     Performed at [facility]   Report Status PENDING   Incomplete  CULTURE, BLOOD (ROUTINE X 2)     Status: None   Collection Time    07/29/13  9:04 PM      Result Value Ref Range Status   Specimen Description BLOOD RIGHT ARM   Final   Special Requests BOTTLES DRAWN AEROBIC ONLY 3CC   Final   Culture  Setup Time     Final   Value: 07/30/2013 04:34     Performed at [facility]   Culture     Final   Value:        BLOOD CULTURE RECEIVED NO GROWTH TO DATE CULTURE WILL BE HELD FOR 5 DAYS BEFORE ISSUING A FINAL NEGATIVE REPORT     Performed at [facility]   Report Status PENDING   Incomplete  MRSA PCR SCREENING     Status: None   Collection Time  07/29/13 10:53 PM      Result Value Ref Range Status   MRSA by PCR NEGATIVE  NEGATIVE Final   Comment:            The GeneXpert MRSA Assay (FDA     approved for NASAL specimens     only), is one component of a     comprehensive MRSA colonization     surveillance program. It is not     intended to diagnose MRSA     infection nor to guide or     monitor treatment for     MRSA infections.  CULTURE, ROUTINE-ABSCESS     Status: None   Collection Time    07/30/13  3:36 PM      Result Value Ref Range Status   Specimen Description DRAINAGE   Final   Special Requests Normal   Final   Gram Stain     Final   Value: ABUNDANT WBC PRESENT,BOTH PMN AND MONONUCLEAR     NO SQUAMOUS EPITHELIAL CELLS SEEN     FEW GRAM NEGATIVE RODS     Performed at [facility]   Culture     Final   Value: MODERATE PROTEUS MIRABILIS     Performed at [facility]   Report Status 08/02/2013 FINAL   Final   Organism ID, Bacteria PROTEUS MIRABILIS   Final  ANAEROBIC CULTURE     Status: None   Collection Time    07/30/13  3:36 PM      Result Value Ref Range Status    Specimen Description ABSCESS   Final   Special Requests Normal   Final   Gram Stain     Final   Value: ABUNDANT WBC PRESENT,BOTH PMN AND MONONUCLEAR     NO SQUAMOUS EPITHELIAL CELLS SEEN     FEW GRAM NEGATIVE RODS     Performed at [facility]   Culture     Final   Value: NO ANAEROBES ISOLATED; CULTURE IN PROGRESS FOR 5 DAYS     Performed at [facility]   Report Status PENDING   Incomplete    Assessment: 42 yr female with h/o MS. CTAbd shows kidney stone, chronic pyelonephritis, psoas abscess and concerned of perinephric abscess s/p drainage 6/22. 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 Broad spectrum antibiotics started 6/21 for sepsis, pyelonephritis.  6/21 >>Rocephin 1gm x 1 >>6/21 6/21 >>Vanc >> 6/26 6/21 >> Zosyn >>  Tmax: remains febrile, 100F WBCs: 10K Renal: Scr wnl, CrCl 95CG, >129ml/min N  6/21 blood: NGTD 6/21 urine: >100k multiple sp - Final 6/22 R psoas abscess: abundant WBC, moderate Proteus mirabilis-pan sens 6/22 R psoas anaerobic: abundant WBC, few GNR (reincubating)   Goal of Therapy:  Appropriate antibiotic dosing for renal function; eradication of infection  Plan:   Continue Zosyn 3.375gm IV q8h (4hr extended infusions) Follow up renal function & cultures  Loma Boston, PharmD Pager: [phone] 08/03/2013 7:52 AM

## 2013-08-03 NOTE — Care Management Note (Addendum)
    Page 1 of 1   08/05/2013     8:25:40 AM CARE MANAGEMENT NOTE 08/05/2013  Patient:  Jacqueline Norman, Jacqueline Norman   Account Number:  [id number]  Date Initiated:  07/30/2013  Documentation initiated by:  DAVIS,RHONDA  Subjective/Objective Assessment:   pt with hx of ms and bed bound, presents with temp 102 and urinary tract symptoms, found to have Obstructing 2.3 cm right UPJ calculus. Severe right nephromegaly  with severe right hydronephrosis and numerous right nephrolithiasis  with per     Action/Plan:   from home has hha through [facility]/family support group/   Anticipated DC Date:  08/05/2013   Anticipated DC Plan:  [place]  CM consult      Choice offered to / List presented to:          [facility] arranged  HH-1 RN      [place].   Status of service:  Completed, signed off Medicare Important Message given?  YES (If response is "NO", the following Medicare IM given date fields will be blank) Date Medicare IM given:  08/03/2013 Date Additional Medicare IM given:    Discharge Disposition:  [place]  Per UR Regulation:  Reviewed for med. necessity/level of care/duration of stay  If discussed at [place] of Stay Meetings, dates discussed:    Comments:  08/05/13 08:00 CM spoke with pt's spouse to confirm choice. Address and contact information verified with spouse.  CM texted referral for [facility] (JP drain mgmt) to [facility] rep, Kristen.  No other CM needs were communicated.  Mariane Masters, BSN, [place] [phone].  08/03/13 Allene Dillon RN BSN [phone] Met with pt and mother at bedside to discuss d/c planning. 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 I will discuss this with the attending MD.

## 2013-08-03 NOTE — Progress Notes (Signed)
Subjective: Patient denies any pain today. She states sometimes she has nausea and dizziness. Family in room today asking about when she may be able to go home.   Objective: Physical Exam: BP 100/69  Pulse 92  Temp(Src) 99.5 F (37.5 C) (Axillary)  Resp 18  Ht 5\' 5"  (1.651 m)  Wt 186 lb 9.6 oz (84.641 kg)  BMI 31.05 kg/m2  SpO2 99%  LMP 06/08/2013  General: NAD, resting in bed  Abd: Soft, NT at drain site, right flank drain site dressing C/D/I, no signs of hematoma, output 24 hours 60 cc (70 cc), 35 cc purulent bloody fluid in bulb and 200 cc of bloody pus aspirated during procedure. Cx gram negative rods, moderate proteus mirabilis  Labs: CBC  Recent Labs  08/01/13 0340 08/02/13 0530  WBC 10.3 10.0  HGB 8.2* 8.2*  HCT 27.7* 28.0*  PLT PLATELET CLUMPS NOTED ON SMEAR, COUNT APPEARS ADEQUATE 393   BMET  Recent Labs  08/02/13 0530 08/03/13 0515  NA 139 137  K 3.9 3.0*  CL 103 102  CO2 20 24  GLUCOSE 83 92  BUN 3* 3*  CREATININE 0.64 0.73  CALCIUM 8.8 8.7   LFT No results found for this basename: PROT, ALBUMIN, AST, ALT, ALKPHOS, BILITOT, BILIDIR, IBILI, LIPASE,  in the last 72 hours PT/INR No results found for this basename: LABPROT, INR,  in the last 72 hours   Studies/Results: No results found.  Assessment/Plan: Multiple Sclerosis  UTI  S/p right psoa/perinephric abscess drain placed in IR 6/22, on zosyn, output trending down consider repeat imaging once output < 20 cc or if fevers persist, patient with low grade temp, wbc WNL  Continue to flush drain and monitor output daily  Plans per urology    LOS: 5 days    Berneta Levins PA-C 08/03/2013 11:12 AM

## 2013-08-04 LAB — CBC
HEMATOCRIT: 26.7 % — AB (ref 36.0–46.0)
HEMATOCRIT: 26.5 % — AB (ref 36.0–46.0)
HEMOGLOBIN: 7.6 g/dL — AB (ref 12.0–15.0)
HEMOGLOBIN: 7.7 g/dL — AB (ref 12.0–15.0)
MCH: 20.5 pg — ABNORMAL LOW (ref 26.0–34.0)
MCH: 20.8 pg — AB (ref 26.0–34.0)
MCHC: 28.5 g/dL — ABNORMAL LOW (ref 30.0–36.0)
MCHC: 29.1 g/dL — ABNORMAL LOW (ref 30.0–36.0)
MCV: 72 fL — ABNORMAL LOW (ref 78.0–100.0)
MCV: 71.4 fL — AB (ref 78.0–100.0)
Platelets: 422 10*3/uL — ABNORMAL HIGH (ref 150–400)
Platelets: 413 10*3/uL — ABNORMAL HIGH (ref 150–400)
RBC: 3.71 MIL/uL — ABNORMAL LOW (ref 3.87–5.11)
RBC: 3.71 MIL/uL — AB (ref 3.87–5.11)
RDW: 21 % — ABNORMAL HIGH (ref 11.5–15.5)
RDW: 21 % — ABNORMAL HIGH (ref 11.5–15.5)
WBC: 10.5 10*3/uL (ref 4.0–10.5)
WBC: 8.9 10*3/uL (ref 4.0–10.5)

## 2013-08-04 LAB — ANAEROBIC CULTURE: SPECIAL REQUESTS: NORMAL

## 2013-08-04 LAB — BASIC METABOLIC PANEL
BUN: 3 mg/dL — ABNORMAL LOW (ref 6–23)
CO2: 25 meq/L (ref 19–32)
Calcium: 8.5 mg/dL (ref 8.4–10.5)
Chloride: 100 mEq/L (ref 96–112)
Creatinine, Ser: 0.7 mg/dL (ref 0.50–1.10)
GFR calc Af Amer: >90 mL/min (ref >90)
GLUCOSE: 86 mg/dL (ref 70–99)
POTASSIUM: 3.4 meq/L — AB (ref 3.7–5.3)
Sodium: 134 mEq/L — ABNORMAL LOW (ref 137–147)

## 2013-08-04 LAB — CLOSTRIDIUM DIFFICILE BY PCR: CDIFFPCR: NEGATIVE

## 2013-08-04 MED ORDER — CIPROFLOXACIN HCL 500 MG PO TABS
500.0000 mg | ORAL_TABLET | Freq: Two times a day (BID) | ORAL | Status: DC
  Administered 2013-08-04 – 2013-08-05 (×3): 500 mg via ORAL
  Filled 2013-08-04 (×4): qty 1

## 2013-08-04 MED ORDER — POTASSIUM CHLORIDE CRYS ER 20 MEQ PO TBCR
40.0000 meq | EXTENDED_RELEASE_TABLET | Freq: Two times a day (BID) | ORAL | Status: AC
  Administered 2013-08-04 (×2): 40 meq via ORAL
  Filled 2013-08-04 (×2): qty 2

## 2013-08-04 MED ORDER — BOOST / RESOURCE BREEZE PO LIQD
1.0000 | Freq: Three times a day (TID) | ORAL | Status: DC
  Administered 2013-08-04 – 2013-08-05 (×3): 1 via ORAL

## 2013-08-04 NOTE — Progress Notes (Signed)
Subjective: Patient feeling better. Anxious to go home  Objective: Physical Exam: BP 95/61  Pulse 99  Temp(Src) 99.4 F (37.4 C) (Oral)  Resp 18  Ht 5\' 5"  (1.651 m)  Wt 187 lb 4.8 oz (84.959 kg)  BMI 31.17 kg/m2  SpO2 98%  LMP 06/08/2013  General: NAD, resting in bed  Drain intact, site clean, NT Output remains cloudy dark bloody, recorded yesterday.  Labs: CBC  Recent Labs  08/02/13 0530 08/04/13 0545  WBC 10.0 10.5  HGB 8.2* 7.6*  HCT 28.0* 26.7*  PLT 393 422*   BMET  Recent Labs  08/03/13 0515 08/04/13 0545  NA 137 134*  K 3.0* 3.4*  CL 102 100  CO2 24 25  GLUCOSE 92 86  BUN 3* 3*  CREATININE 0.73 0.70  CALCIUM 8.7 8.5   LFT No results found for this basename: PROT, ALBUMIN, AST, ALT, ALKPHOS, BILITOT, BILIDIR, IBILI, LIPASE,  in the last 72 hours PT/INR No results found for this basename: LABPROT, INR,  in the last 72 hours   Studies/Results: No results found.  Assessment/Plan: Multiple Sclerosis  UTI  S/p right psoa/perinephric abscess drain placed in IR 6/22, on zosyn. Output remains significant Recommend at least daily flushes with 5cc NS. HHRN may be necessary unless family willing to do. Will need follow up CT when output low, <20cc/day or if clinical status changes. Family can contact IR department at [phone] to help schedule CT. Will need to follow up with Urology as well.    LOS: 6 days    Brayton El PA-C 08/04/2013 9:07 AM

## 2013-08-04 NOTE — Progress Notes (Signed)
PROGRESS NOTE  Jacqueline Norman:[id number] DOB: 30-Aug-1970 DOA: 07/29/2013 PCP: No primary provider on file.  HPI: Jacqueline Norman is a 43 y.o. female with Past medical history of multiple sclerosis with chronic quadriplegia and bedbound status. Patient presented with complaints of abdominal pain that has been ongoing since last one week. She also had an episode of vomiting one week ago and some nausea. She has bowel movements 2 days ago and has chronic constipation at her baseline. She complains of fever but no chills no cough no chest pain no shortness of breath no dizziness no lightheadedness.  She mentions her MS is stable and does not have any new focal neurological deficit.  She denies any hospitalization recently for prior. She denies any history of surgeries on her stomach or abdomen.  The patient is coming from home. And at her baseline dependent for most of her ADL.    Assessment/Plan: Sepsis due to pyelonephritis - meets criteria for sepsis with SBP < 90 and tachycardia plus infection source. CT scan showed obstructing calculus on the right with right hydronephrosis and the psoas muscle abscess. Urology consulted, appreciate input - patient with a psoas abscess on the CT scan, IR consulted s/p drainage on 6/22 when 200 cc of blood tinged purulent fluid was obtained. Cultures sent and pending, continue broad spectrum antibiotics for now.  Continue with vancomycin and Zosyn day 5, follow up culture, preliminary growing proteus, sensitive to Zosyn.   BP stable.  Afebrile. Change antibiotics to oral. Observe on oral antibiotics. Plan to discharge on 6-28 if doesn't spike fever.   MS - seems at baseline at this point, closely monitor . Resume interferon.   Iron deficiency anemia - start on supplementation  - Likely anemia worsened in the setting of sepsis - Hb as low as 7.1 on 6/22, transfused 1U of pRBC.  Hypokalemia - replete.   Right hydronephrosis with nephrolithiasis - urology  consulted, appreciate input. Might need nephrectomy in few weeks, months. No need for renal gram at this time , discussed with Dr Julien Girt. Needs to follow up with Dr Julien Girt.   Diarrhea; had 3 BM yesterday. Change ensure to breeze. c diff pending.   Diet: regular  Fluids: NS DVT Prophylaxis: SCD  Code Status: Full Family Communication: d/w patient.  Disposition Plan: home in 24 to 48 hours if afebrile.    Consultants:  Urology   IR  Procedures:  None    Antibiotics Vancomycin 6/22 >> Zosyn 6/22>>  Microbiology  6/22 Abscess culture with few GNRs, proteus.  Urine culture 6/21- 100,000 colonies/mL, multiple morphologies, FINAL.  Blood cultures 6/21 - negative, pending.  HPI/Subjective: Feeling well. Had 3 bm yesterday.   Objective: Filed Vitals:   08/03/13 1611 08/03/13 2112 08/04/13 0557 08/04/13 0633  BP: 100/68 110/75  95/61  Pulse: 84 95  99  Temp: 97.6 F (36.4 C) 99.1 F (37.3 C)  99.4 F (37.4 C)  TempSrc: Oral Oral  Oral  Resp: 18 20  18   Height:      Weight:   84.959 kg (187 lb 4.8 oz)   SpO2: 100% 99%  98%    Intake/Output Summary (Last 24 hours) at 08/04/13 1405 Last data filed at 08/04/13 0945  Gross per 24 hour  Intake   1951 ml  Output    105 ml  Net   1846 ml   Filed Weights   07/31/13 0400 08/03/13 0538 08/04/13 0557  Weight: 80.9 kg (178 lb 5.6 oz) 84.641  kg (186 lb 9.6 oz) 84.959 kg (187 lb 4.8 oz)   Exam:  General:  NAD  Cardiovascular: regular rate and rhythm, without MRG  Respiratory: good air movement, clear to auscultation throughout, no wheezing, ronchi or rales  Abdomen: soft,  mild tenderness to palpation in the central area. Drain in place with blood tinged fluid , pus.   MSK: no peripheral edema.  Neuro: Bilateral footdrop, no purposeful movement bilaterally upper and lower extremity limited sensation  Data Reviewed: Basic Metabolic Panel:  Recent Labs Lab 07/30/13 0700 08/01/13 0340 08/02/13 0530 08/03/13 0515  08/04/13 0545  NA 138 136* 139 137 134*  K 4.0 3.1* 3.9 3.0* 3.4*  CL 103 102 103 102 100  CO2 25 23 20 24 25   GLUCOSE 106* 114* 83 92 86  BUN 6 4* 3* 3* 3*  CREATININE 0.62 0.67 0.64 0.73 0.70  CALCIUM 8.6 8.5 8.8 8.7 8.5   Liver Function Tests:  Recent Labs Lab 07/29/13 1623 07/30/13 0700  AST 20 18  ALT 9 9  ALKPHOS 83 77  BILITOT 0.6 0.7  PROT 9.4* 8.5*  ALBUMIN 2.1* 1.9*    Recent Labs Lab 07/29/13 1623  LIPASE 24   CBC:  Recent Labs Lab 07/29/13 1623 07/30/13 0700 07/31/13 0815 08/01/13 0340 08/02/13 0530 08/04/13 0545 08/04/13 1237  WBC 10.0 9.9 9.5 10.3 10.0 10.5 8.9  NEUTROABS 6.3 7.5  --   --   --   --   --   HGB 7.5* 7.1* 8.3* 8.2* 8.2* 7.6* 7.7*  HCT 26.2* 24.9* 28.3* 27.7* 28.0* 26.7* 26.5*  MCV 68.9* 69.4* 70.9* 71.2* 70.9* 72.0* 71.4*  PLT 429* 368 356 PLATELET CLUMPS NOTED ON SMEAR, COUNT APPEARS ADEQUATE 393 422* 413*   Recent Results (from the past 240 hour(s))  URINE CULTURE     Status: None   Collection Time    07/29/13  4:54 PM      Result Value Ref Range Status   Specimen Description URINE, RANDOM   Final   Special Requests NONE   Final   Culture  Setup Time     Final   Value: 07/30/2013 11:10     Performed at [facility] Count     Final   Value: >=100,000 COLONIES/ML     Performed at [facility]   Culture     Final   Value: Multiple bacterial morphotypes present, none predominant. Suggest appropriate recollection if clinically indicated.     Performed at [facility]   Report Status 07/31/2013 FINAL   Final  CULTURE, BLOOD (ROUTINE X 2)     Status: None   Collection Time    07/29/13  9:03 PM      Result Value Ref Range Status   Specimen Description BLOOD RIGHT HAND   Final   Special Requests BOTTLES DRAWN AEROBIC AND ANAEROBIC 5CC   Final   Culture  Setup Time     Final   Value: 07/30/2013 04:34     Performed at [facility]   Culture     Final   Value:        BLOOD CULTURE  RECEIVED NO GROWTH TO DATE CULTURE WILL BE HELD FOR 5 DAYS BEFORE ISSUING A FINAL NEGATIVE REPORT     Performed at [facility]   Report Status PENDING   Incomplete  CULTURE, BLOOD (ROUTINE X 2)     Status: None   Collection Time    07/29/13  9:04 PM      Result Value Ref Range Status   Specimen Description BLOOD RIGHT ARM   Final   Special Requests BOTTLES DRAWN AEROBIC ONLY 3CC   Final   Culture  Setup Time     Final   Value: 07/30/2013 04:34     Performed at [facility]   Culture     Final   Value:        BLOOD CULTURE RECEIVED NO GROWTH TO DATE CULTURE WILL BE HELD FOR 5 DAYS BEFORE ISSUING A FINAL NEGATIVE REPORT     Performed at [facility]   Report Status PENDING   Incomplete  MRSA PCR SCREENING     Status: None   Collection Time    07/29/13 10:53 PM      Result Value Ref Range Status   MRSA by PCR NEGATIVE  NEGATIVE Final   Comment:            The GeneXpert MRSA Assay (FDA     approved for NASAL specimens     only), is one component of a     comprehensive MRSA colonization     surveillance program. It is not     intended to diagnose MRSA     infection nor to guide or     monitor treatment for     MRSA infections.  CULTURE, ROUTINE-ABSCESS     Status: None   Collection Time    07/30/13  3:36 PM      Result Value Ref Range Status   Specimen Description DRAINAGE   Final   Special Requests Normal   Final   Gram Stain     Final   Value: ABUNDANT WBC PRESENT,BOTH PMN AND MONONUCLEAR     NO SQUAMOUS EPITHELIAL CELLS SEEN     FEW GRAM NEGATIVE RODS     Performed at [facility]   Culture     Final   Value: MODERATE PROTEUS MIRABILIS     Performed at [facility]   Report Status 08/02/2013 FINAL   Final   Organism ID, Bacteria PROTEUS MIRABILIS   Final  ANAEROBIC CULTURE     Status: None   Collection Time    07/30/13  3:36 PM      Result Value Ref Range Status   Specimen Description ABSCESS   Final   Special Requests Normal    Final   Gram Stain     Final   Value: ABUNDANT WBC PRESENT,BOTH PMN AND MONONUCLEAR     NO SQUAMOUS EPITHELIAL CELLS SEEN     FEW GRAM NEGATIVE RODS     Performed at [facility]   Culture     Final   Value: NO ANAEROBES ISOLATED     Performed at [facility]   Report Status 08/04/2013 FINAL   Final   Studies: No results found. Scheduled Meds: . baclofen  10 mg Oral BID  . benztropine  1 mg Oral BID  . ciprofloxacin  500 mg Oral BID  . feeding supplement (RESOURCE BREEZE)  1 Container Oral TID BM  . ferrous sulfate  325 mg Oral TID WC  . Interferon Beta-1b  0.3 mg Subcutaneous QODAY  . levothyroxine  50 mcg Oral QAC breakfast  . potassium chloride  40 mEq Oral BID  . sodium chloride  3 mL Intravenous Q12H   Continuous Infusions: . sodium chloride 100 mL/hr at 08/04/13 0556   Principal Problem:   Pyelonephritis Active  Problems:   Perinephric abscess   Possible Psoas abscess   Multiple sclerosis   Microcytic anemia   Hypokalemia   Renal stone   Sepsis  Time spent: 25  This note has been created with Education officer, environmentalDragon speech recognition software and smart phrase technology. Any transcriptional errors are unintentional.   Hartley BarefootBelkys Regalado, Md.  [facility] Pager [phone]. If 7 PM - 7 AM, please contact night-coverage at [email or web address], password [email or web address] 08/04/2013, 2:05 PM  LOS: 6 days

## 2013-08-05 LAB — CULTURE, BLOOD (ROUTINE X 2)
CULTURE: NO GROWTH
Culture: NO GROWTH

## 2013-08-05 LAB — HEMOGLOBIN AND HEMATOCRIT, BLOOD
HCT: 27.2 % — ABNORMAL LOW (ref 36.0–46.0)
HEMOGLOBIN: 7.9 g/dL — AB (ref 12.0–15.0)

## 2013-08-05 MED ORDER — CIPROFLOXACIN HCL 500 MG PO TABS: 500.0000 mg | ORAL_TABLET | Freq: Two times a day (BID) | ORAL | Status: DC

## 2013-08-05 MED ORDER — BOOST / RESOURCE BREEZE PO LIQD: 1.0000 | Freq: Three times a day (TID) | ORAL | Status: DC

## 2013-08-05 MED ORDER — OXYCODONE HCL 5 MG PO TABS: 5.0000 mg | ORAL_TABLET | Freq: Four times a day (QID) | ORAL | Status: DC | PRN

## 2013-08-05 MED ORDER — FERROUS SULFATE 325 (65 FE) MG PO TABS: 325.0000 mg | ORAL_TABLET | Freq: Three times a day (TID) | ORAL | Status: DC

## 2013-08-05 NOTE — Discharge Summary (Signed)
Physician Discharge Summary  [name] QQV:[id number] DOB: 24-Aug-1970 DOA: 07/29/2013  PCP: No primary provider on file.  Admit date: 07/29/2013 Discharge date: 08/05/2013  Time spent: 35 minutes  Recommendations for Outpatient Follow-up:  1. Needs to follow up with Dr Janice Norrie for further care of perinephric abscess.  2. Needs follow up CT abdomen pelvis.  3. Needs cbc to follow hb.   Discharge Diagnoses:    Perinephric abscess, Psoas abscess   Pyelonephritis   Multiple sclerosis   Microcytic anemia   Hypokalemia   Renal stone   Sepsis   Discharge Condition: Stable.   Diet recommendation: Heart Healthy  Filed Weights   08/03/13 0538 08/04/13 0557 08/05/13 0615  Weight: 84.641 kg (186 lb 9.6 oz) 84.959 kg (187 lb 4.8 oz) 84.959 kg (187 lb 4.8 oz)    History of present illness:  Jacqueline Norman is a 43 y.o. female with Past medical history of multiple sclerosis with chronic quadriplegia and bedbound status.  Patient presented with complaints of abdominal pain that has been ongoing since last one week. She also had an episode of vomiting one week ago and some nausea. She has bowel movements 2 days ago and has chronic constipation at her baseline. She complains of fever but no chills no cough no chest pain no shortness of breath no dizziness no lightheadedness.  She mentions her MS is stable and does not have any new focal neurological deficit.  She denies any hospitalization recently for prior. She denies any history of surgeries on her stomach or abdomen.  The patient is coming from home. And at her baseline dependent for most of her ADL.   Hospital Course:  Jacqueline Norman is a 43 y.o. female with Past medical history of multiple sclerosis with chronic quadriplegia and bedbound status. Patient presented with complaints of abdominal pain that has been ongoing since last one week. She also had an episode of vomiting one week ago and some nausea. She has bowel movements 2 days  ago and has chronic constipation at her baseline. She complains of fever but no chills no cough no chest pain no shortness of breath no dizziness no lightheadedness. She mentions her MS is stable and does not have any new focal neurological deficit. She denies any hospitalization recently for prior. She denies any history of surgeries on her stomach or abdomen. The patient is coming from home. And at her baseline dependent for most of her ADL. She was admitted with sepsis, secondary to Perinephric abscess, Psoas abscess  Assessment/Plan:  Sepsis due to Perinephric abscess, Psoas abscess, pyelonephritis - meets criteria for sepsis with SBP < 90 and tachycardia plus infection source. CT scan showed obstructing calculus on the right with right hydronephrosis and the psoas muscle abscess. Urology consulted, appreciate input.  - patient with a psoas abscess on the CT scan, IR consulted s/p drainage on 6/22 when 200 cc of blood tinged purulent fluid was obtained.  Patient received  vancomycin and Zosyn for 5  Days. Culture from abscess ,result growing proteus, sensitive to Zosyn and ciprofloxacin. Antibiotics change to oral ciprofloxacin.  Patient has remain afebrile. Plan to discharge with drain in placed. She will need a CT abdomen when out put from drain is less than 20 cc. Discussed with Dr Kellie Simmering, patient will need 3 weeks of antibiotics. She will required nephrectomy. She needs to follow up with Dr Janice Norrie for further care.   MS - seems at baseline at this point, closely monitor . Resume  interferon.   Iron deficiency anemia - start on supplementation  - Likely anemia worsened in the setting of sepsis  - Hb as low as 7.1 on 6/22, transfused 1U of pRBC.  -Hb stable.   Hypokalemia - replete.   Right hydronephrosis with nephrolithiasis - urology consulted, appreciate input. Might need nephrectomy in few weeks, months. No need for renal gram at this time , discussed with Dr Julien Girt. Needs to follow up with Dr  Julien Girt.   Diarrhea; had 3 BM yesterday. Change ensure to breeze. c diff negative. Diarrhea improved.    Procedures:  none  Consultations:  IR  Dr Brunilda Payor, with urology.   Discharge Exam: Filed Vitals:   08/05/13 0615  BP: 119/83  Pulse: 103  Temp: 97.4 F (36.3 C)  Resp: 16    General: no distress.  Cardiovascular: S 1, S 2 RRR Respiratory: CTA   Discharge Instructions You were cared for by a hospitalist during your hospital stay. If you have any questions about your discharge medications or the care you received while you were in the hospital after you are discharged, you can call the unit and asked to speak with the hospitalist on call if the hospitalist that took care of you is not available. Once you are discharged, your primary care physician will handle any further medical issues. Please note that NO REFILLS for any discharge medications will be authorized once you are discharged, as it is imperative that you return to your primary care physician (or establish a relationship with a primary care physician if you do not have one) for your aftercare needs so that they can reassess your need for medications and monitor your lab values.  Discharge Instructions   Diet - low sodium heart healthy    Complete by:  As directed             Medication List         baclofen 10 MG tablet  Commonly known as:  LIORESAL  Take 10 mg by mouth 2 (two) times daily.     benztropine 1 MG tablet  Commonly known as:  COGENTIN  Take 1 mg by mouth 2 (two) times daily.     cholecalciferol 1000 UNITS tablet  Commonly known as:  VITAMIN D  Take 1,000 Units by mouth every morning.     ciprofloxacin 500 MG tablet  Commonly known as:  CIPRO  Take 1 tablet (500 mg total) by mouth 2 (two) times daily.     docusate sodium 100 MG capsule  Commonly known as:  COLACE  Take 100 mg by mouth daily as needed for mild constipation.     feeding supplement (RESOURCE BREEZE) Liqd  Take 1 Container  by mouth 3 (three) times daily between meals.     ferrous sulfate 325 (65 FE) MG tablet  Take 1 tablet (325 mg total) by mouth 3 (three) times daily with meals.     Interferon Beta-1b 0.3 MG Kit injection  Commonly known as:  BETASERON/EXTAVIA  Inject 0.3 mg into the skin every other day.     levothyroxine 50 MCG tablet  Commonly known as:  SYNTHROID, LEVOTHROID  Take 50 mcg by mouth daily before breakfast.     omega-3 acid ethyl esters 1 G capsule  Commonly known as:  LOVAZA  Take 1 g by mouth every morning.     oxyCODONE 5 MG immediate release tablet  Commonly known as:  Oxy IR/ROXICODONE  Take 1 tablet (5 mg total) by  mouth every 6 (six) hours as needed for severe pain.     potassium chloride SA 20 MEQ tablet  Commonly known as:  K-DUR,KLOR-CON  Take 20 mEq by mouth every morning.     simvastatin 5 MG tablet  Commonly known as:  ZOCOR  Take 2.5 mg by mouth at bedtime.       No Known Allergies     Follow-up Information   Follow up with [facility]. (home health nurse manage/teach drain management)    Contact information:   [address] [place] [address] [phone]       Please follow up. (Will need follow up CT when output low, <20cc/day or if clinical status changes. Family can contact IR department at [phone] to help schedule CT, when )       Follow up with Nesi, Charlene Brooke, MD In 1 week.   Specialty:  Urology   Contact information:   [place] [address] [phone]        The results of significant diagnostics from this hospitalization (including imaging, microbiology, ancillary and laboratory) are listed below for reference.    Significant Diagnostic Studies: Ct Abdomen Pelvis W Contrast  07/29/2013   ADDENDUM REPORT: 07/29/2013 19:20  ADDENDUM: Xanthogranulomatous pyelonephritis is also in the differential diagnosis for the right renal findings.   Electronically Signed   By: Kathreen Devoid   On: 07/29/2013  19:20   07/29/2013   CLINICAL DATA:  Abdominal pain for 1 week.  Painful urination.  EXAM: CT ABDOMEN AND PELVIS WITH CONTRAST  TECHNIQUE: Multidetector CT imaging of the abdomen and pelvis was performed using the standard protocol following bolus administration of intravenous contrast.  CONTRAST:  156mL OMNIPAQUE IOHEXOL 300 MG/ML SOLN, 85mL OMNIPAQUE IOHEXOL 300 MG/ML SOLN  COMPARISON:  None.  FINDINGS: Right basilar atelectasis.  The liver demonstrates no focal abnormality. There is no intrahepatic or extrahepatic biliary ductal dilatation. The gallbladder is normal. The spleen demonstrates no focal abnormality. The adrenal glands and pancreas are normal.  Severe right nephromegaly with severe right hydronephrosis. There is a 2.3 cm calculus at the right UPJ. Numerous right nephrolithiasis. There is significant right perinephric stranding. There is a 7.6 x 5.8 cm complex fluid collection in the right psoas muscle. There are 2 punctate calcifications at the right UVJ which may reflect recently passed calculi versus soft tissue calcifications. There is right retroperitoneal lymphadenopathy with the largest retrocaval lymph node measuring 16.7 mm.  The stomach, duodenum, small intestine, and large intestine demonstrate no contrast extravasation or dilatation. There is no pneumoperitoneum, pneumatosis, or portal venous gas. There is no abdominal or pelvic free fluid. There is no lymphadenopathy. There are injection granulomas in the anterior and posterior subcutaneous fat.  The abdominal aorta is normal in caliber with atherosclerosis.  There are no lytic or sclerotic osseous lesions.  IMPRESSION: 1. Obstructing 2.3 cm right UPJ calculus. Severe right nephromegaly with severe right hydronephrosis and numerous right nephrolithiasis with perinephric stranding. 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 There is a 7.6 x 5.8 cm complex fluid collection in the right psoas muscle concerning for an  abscess.  2. There is no contrast seen within the right collecting system on the delayed images consistent with impaired function.  3. There are 2 punctate calcifications at the right UVJ which may reflect recently passed calculi versus bladder wall calcifications.  Urology consultation is recommended in  Electronically Signed: By: Kathreen Devoid On: 07/29/2013 19:13  Ct Image Guided Drainage Percut Cath  Peritoneal Retroperit  07/30/2013   CLINICAL DATA:  Right psoas abscess  EXAM: CT-GUIDED RIGHT PSOAS ABSCESS DRAIN INSERTION  Date:  6/22/20156/22/2015 3:29 PM  Radiologist:  Jerilynn Mages. Daryll Brod, MD  Guidance:  CT  FLUOROSCOPY TIME:  NONE.  MEDICATIONS AND MEDICAL HISTORY: 2 MG VERSED, 50 MCG FENTANYL  ANESTHESIA/SEDATION: 15 min  CONTRAST:  None.  COMPLICATIONS: No immediate  PROCEDURE: Informed consent was obtained from the patient following explanation of the procedure, risks, benefits and alternatives. The patient understands, agrees and consents for the procedure. All questions were addressed. A time out was performed.  Maximal barrier sterile technique utilized including caps, mask, sterile gowns, sterile gloves, large sterile drape, hand hygiene, and Betadine.  Previous imaging reviewed. Patient positioned right anterior oblique. Noncontrast localization CT performed. The right iliopsoas abscess was localized with CT. Under sterile conditions and local anesthesia, an 18 gauge 15 cm access needle was advanced from a posterior flank approach into the psoas abscess. Needle position confirmed with CT. Syringe aspiration yielded purulent fluid compatible with an abscess. Guidewire inserted followed by tract dilatation to insert a 12 [place] drain. Retention loop formed in the abscess cavity. 200cc blood tinged purulent fluid aspirated. Samples sent for Gram stain and culture. Postprocedure imaging demonstrates significant collapse of the abscess cavity. Catheter secured with a Prolene suture and connected to  external suction bulb. Sterile dressing applied. No immediate complication. Patient tolerated the procedure well.  IMPRESSION: Successful CT-guided right psoas abscess drain insertion.   Electronically Signed   By: Daryll Brod M.D.   On: 07/30/2013 16:32    Microbiology: Recent Results (from the past 240 hour(s))  URINE CULTURE     Status: None   Collection Time    07/29/13  4:54 PM      Result Value Ref Range Status   Specimen Description URINE, RANDOM   Final   Special Requests NONE   Final   Culture  Setup Time     Final   Value: 07/30/2013 11:10     Performed at [facility]     Final   Value: >=100,000 COLONIES/ML     Performed at [facility]   Culture     Final   Value: Multiple bacterial morphotypes present, none predominant. Suggest appropriate recollection if clinically indicated.     Performed at [facility]   Report Status 07/31/2013 FINAL   Final  CULTURE, BLOOD (ROUTINE X 2)     Status: None   Collection Time    07/29/13  9:03 PM      Result Value Ref Range Status   Specimen Description BLOOD RIGHT HAND   Final   Special Requests BOTTLES DRAWN AEROBIC AND ANAEROBIC 5CC   Final   Culture  Setup Time     Final   Value: 07/30/2013 04:34     Performed at [facility]   Culture     Final   Value:        BLOOD CULTURE RECEIVED NO GROWTH TO DATE CULTURE WILL BE HELD FOR 5 DAYS BEFORE ISSUING A FINAL NEGATIVE REPORT     Performed at [facility]   Report Status PENDING   Incomplete  CULTURE, BLOOD (ROUTINE X 2)     Status: None   Collection Time    07/29/13  9:04 PM      Result Value Ref Range Status   Specimen Description BLOOD RIGHT ARM  Final   Special Requests BOTTLES DRAWN AEROBIC ONLY 3CC   Final   Culture  Setup Time     Final   Value: 07/30/2013 04:34     Performed at [facility]   Culture     Final   Value:        BLOOD CULTURE RECEIVED NO GROWTH TO DATE CULTURE WILL BE HELD FOR 5 DAYS  BEFORE ISSUING A FINAL NEGATIVE REPORT     Performed at [facility]   Report Status PENDING   Incomplete  MRSA PCR SCREENING     Status: None   Collection Time    07/29/13 10:53 PM      Result Value Ref Range Status   MRSA by PCR NEGATIVE  NEGATIVE Final   Comment:            The GeneXpert MRSA Assay (FDA     approved for NASAL specimens     only), is one component of a     comprehensive MRSA colonization     surveillance program. It is not     intended to diagnose MRSA     infection nor to guide or     monitor treatment for     MRSA infections.  CULTURE, ROUTINE-ABSCESS     Status: None   Collection Time    07/30/13  3:36 PM      Result Value Ref Range Status   Specimen Description DRAINAGE   Final   Special Requests Normal   Final   Gram Stain     Final   Value: ABUNDANT WBC PRESENT,BOTH PMN AND MONONUCLEAR     NO SQUAMOUS EPITHELIAL CELLS SEEN     FEW GRAM NEGATIVE RODS     Performed at [facility]   Culture     Final   Value: MODERATE PROTEUS MIRABILIS     Performed at [facility]   Report Status 08/02/2013 FINAL   Final   Organism ID, Bacteria PROTEUS MIRABILIS   Final  ANAEROBIC CULTURE     Status: None   Collection Time    07/30/13  3:36 PM      Result Value Ref Range Status   Specimen Description ABSCESS   Final   Special Requests Normal   Final   Gram Stain     Final   Value: ABUNDANT WBC PRESENT,BOTH PMN AND MONONUCLEAR     NO SQUAMOUS EPITHELIAL CELLS SEEN     FEW GRAM NEGATIVE RODS     Performed at [facility]   Culture     Final   Value: NO ANAEROBES ISOLATED     Performed at [facility]   Report Status 08/04/2013 FINAL   Final  CLOSTRIDIUM DIFFICILE BY PCR     Status: None   Collection Time    08/04/13 11:46 AM      Result Value Ref Range Status   C difficile by pcr NEGATIVE  NEGATIVE Final   Comment: Performed at [facility]: Basic Metabolic Panel:  Recent Labs Lab 07/30/13 0700  08/01/13 0340 08/02/13 0530 08/03/13 0515 08/04/13 0545  NA 138 136* 139 137 134*  K 4.0 3.1* 3.9 3.0* 3.4*  CL 103 102 103 102 100  CO2 _0 GLUCOSE 106* 114* 83 92 86  BUN 6 4* 3* 3* 3*  CREATININE 0.62 0.67 0.64 0.73 0.70  CALCIUM 8.6 8.5 8.8 8.7 8.5  Liver Function Tests:  Recent Labs Lab 07/29/13 1623 07/30/13 0700  AST 20 18  ALT 9 9  ALKPHOS 83 77  BILITOT 0.6 0.7  PROT 9.4* 8.5*  ALBUMIN 2.1* 1.9*    Recent Labs Lab 07/29/13 1623  LIPASE 24   No results found for this basename: AMMONIA,  in the last 168 hours CBC:  Recent Labs Lab 07/29/13 1623 07/30/13 0700 07/31/13 0815 08/01/13 0340 08/02/13 0530 08/04/13 0545 08/04/13 1237 08/05/13 0615  WBC 10.0 9.9 9.5 10.3 10.0 10.5 8.9  --   NEUTROABS 6.3 7.5  --   --   --   --   --   --   HGB 7.5* 7.1* 8.3* 8.2* 8.2* 7.6* 7.7* 7.9*  HCT 26.2* 24.9* 28.3* 27.7* 28.0* 26.7* 26.5* 27.2*  MCV 68.9* 69.4* 70.9* 71.2* 70.9* 72.0* 71.4*  --   PLT 429* 368 356 PLATELET CLUMPS NOTED ON SMEAR, COUNT APPEARS ADEQUATE 393 422* 413*  --    Cardiac Enzymes: No results found for this basename: CKTOTAL, CKMB, CKMBINDEX, TROPONINI,  in the last 168 hours BNP: BNP (last 3 results) No results found for this basename: PROBNP,  in the last 8760 hours CBG: No results found for this basename: GLUCAP,  in the last 168 hours     Signed:  Niel Hummer A  [facility] 08/05/2013, 8:35 AM

## 2013-08-05 NOTE — Discharge Instructions (Signed)
Recommend at least daily flushes with 5cc NS.

## 2013-08-08 ENCOUNTER — Emergency Department (HOSPITAL_COMMUNITY)
Admission: EM | Admit: 2013-08-08 | Discharge: 2013-08-08 | Disposition: A | Discharge status: Home / Self Care | Payer: Medicare Other | Attending: Emergency Medicine | Admitting: Emergency Medicine

## 2013-08-08 ENCOUNTER — Encounter (HOSPITAL_COMMUNITY): Payer: Self-pay | Admitting: Emergency Medicine

## 2013-08-08 DIAGNOSIS — Y833 Surgical operation with formation of external stoma as the cause of abnormal reaction of the patient, or of later complication, without mention of misadventure at the time of the procedure: Secondary | ICD-10-CM | POA: Insufficient documentation

## 2013-08-08 DIAGNOSIS — Z8669 Personal history of other diseases of the nervous system and sense organs: Secondary | ICD-10-CM | POA: Insufficient documentation

## 2013-08-08 DIAGNOSIS — T83098A Other mechanical complication of other indwelling urethral catheter, initial encounter: Secondary | ICD-10-CM

## 2013-08-08 DIAGNOSIS — Z87891 Personal history of nicotine dependence: Secondary | ICD-10-CM | POA: Insufficient documentation

## 2013-08-08 DIAGNOSIS — IMO0002 Reserved for concepts with insufficient information to code with codable children: Secondary | ICD-10-CM | POA: Insufficient documentation

## 2013-08-08 DIAGNOSIS — N9989 Other postprocedural complications and disorders of genitourinary system: Secondary | ICD-10-CM | POA: Insufficient documentation

## 2013-08-08 DIAGNOSIS — Z79899 Other long term (current) drug therapy: Secondary | ICD-10-CM | POA: Insufficient documentation

## 2013-08-08 DIAGNOSIS — Z792 Long term (current) use of antibiotics: Secondary | ICD-10-CM | POA: Insufficient documentation

## 2013-08-08 MED ORDER — OXYCODONE HCL 5 MG PO TABS
5.0000 mg | ORAL_TABLET | Freq: Once | ORAL | Status: AC
Start: 2013-08-08 — End: 2013-08-08
  Administered 2013-08-08: 5 mg via ORAL
  Filled 2013-08-08: qty 1

## 2013-08-08 NOTE — ED Notes (Signed)
[facility] called  

## 2013-08-08 NOTE — ED Notes (Signed)
Bed: WA15 Expected date:  Expected time:  Means of arrival:  Comments: ems 

## 2013-08-08 NOTE — ED Notes (Signed)
Per EMS: Pt was visited by home health nurse and the right nephrostomy tube hasn't been draining as much. It is reported that yesterday it drained 6 ml and today 5 ml. Pt states her bulb will not maintain suction today. Pt is A/O x4, pt has MS and is at baseline: can not move arms or legs.

## 2013-08-08 NOTE — Discharge Instructions (Signed)
Follow up with urology this week.

## 2013-08-08 NOTE — ED Provider Notes (Signed)
CSN: [id number]     Arrival date & time 08/08/13  1743 History   First MD Initiated Contact with Patient 08/08/13 1827     Chief Complaint  Patient presents with  . Decreased nephrostomy tube output      (Consider location/radiation/quality/duration/timing/severity/associated sxs/prior Treatment) The history is provided by a relative and the patient.   patient here with decreased output from her surgical drain. . The mother is concerned tube may be dislodged. She had 10 cc drainage yesterday and only 5 cc today. Tube is in place for a week. No fever, vomiting, diarrhea. Called urology and told to come here. Symptoms persisted and nothing makes them better worse. Attempted to obtain drainage through the bulb without success  Past Medical History  Diagnosis Date  . MS (multiple sclerosis)    Past Surgical History  Procedure Laterality Date  . No past surgeries     History reviewed. No pertinent family history. History  Substance Use Topics  . Smoking status: Former Smoker    Quit date: 08/08/1997  . Smokeless tobacco: Never Used  . Alcohol Use: No   OB History   Grav Para Term Preterm Abortions TAB SAB Ect Mult Living                 Review of Systems  All other systems reviewed and are negative.     Allergies  Review of patient's allergies indicates no known allergies.  Home Medications   Prior to Admission medications   Medication Sig Start Date End Date Taking? Authorizing Provider  baclofen (LIORESAL) 10 MG tablet Take 10 mg by mouth 2 (two) times daily.   Yes Historical Provider, MD  benztropine (COGENTIN) 1 MG tablet Take 1 mg by mouth 2 (two) times daily.   Yes Historical Provider, MD  cholecalciferol (VITAMIN D) 1000 UNITS tablet Take 1,000 Units by mouth every morning.   Yes Historical Provider, MD  ciprofloxacin (CIPRO) 500 MG tablet Take 1 tablet (500 mg total) by mouth 2 (two) times daily. 08/05/13  Yes Belkys A Regalado, MD  docusate sodium (COLACE) 100 MG  capsule Take 100 mg by mouth daily as needed for mild constipation.   Yes Historical Provider, MD  feeding supplement, RESOURCE BREEZE, (RESOURCE BREEZE) LIQD Take 1 Container by mouth 3 (three) times daily between meals. 08/05/13  Yes Belkys A Regalado, MD  ferrous sulfate 325 (65 FE) MG tablet Take 1 tablet (325 mg total) by mouth 3 (three) times daily with meals. 08/05/13  Yes Belkys A Regalado, MD  Interferon Beta-1b (BETASERON/EXTAVIA) 0.3 MG KIT injection Inject 0.3 mg into the skin every other day.   Yes Historical Provider, MD  levothyroxine (SYNTHROID, LEVOTHROID) 50 MCG tablet Take 50 mcg by mouth daily before breakfast.   Yes Historical Provider, MD  omega-3 acid ethyl esters (LOVAZA) 1 G capsule Take 1 g by mouth every morning.   Yes Historical Provider, MD  oxyCODONE (OXY IR/ROXICODONE) 5 MG immediate release tablet Take 1 tablet (5 mg total) by mouth every 6 (six) hours as needed for severe pain. 08/05/13  Yes Belkys A Regalado, MD  potassium chloride SA (K-DUR,KLOR-CON) 20 MEQ tablet Take 20 mEq by mouth every morning.   Yes Historical Provider, MD  simvastatin (ZOCOR) 5 MG tablet Take 2.5 mg by mouth at bedtime.   Yes Historical Provider, MD   BP 109/76  Pulse 97  Temp(Src) 98.7 F (37.1 C) (Oral)  Resp 16  SpO2 99%  LMP 06/08/2013 Physical Exam  Nursing  note and vitals reviewed. Constitutional: She is oriented to person, place, and time. She appears well-developed and well-nourished.  Non-toxic appearance. No distress.  HENT:  Head: Normocephalic and atraumatic.  Eyes: Conjunctivae, EOM and lids are normal. Pupils are equal, round, and reactive to light.  Neck: Normal range of motion. Neck supple. No tracheal deviation present. No mass present.  Cardiovascular: Normal rate, regular rhythm and normal heart sounds.  Exam reveals no gallop.   No murmur heard. Pulmonary/Chest: Effort normal and breath sounds normal. No stridor. No respiratory distress. She has no decreased breath  sounds. She has no wheezes. She has no rhonchi. She has no rales.  Abdominal: Soft. Normal appearance and bowel sounds are normal. She exhibits no distension. There is no tenderness. There is no rebound and no CVA tenderness.    Musculoskeletal: Normal range of motion. She exhibits no edema and no tenderness.  Neurological: She is alert and oriented to person, place, and time. She has normal strength. No cranial nerve deficit. GCS eye subscore is 4. GCS verbal subscore is 5. GCS motor subscore is 6.  Neuro exam is at baseline per patient and her mother  Skin: Skin is warm and dry. No abrasion and no rash noted.  Psychiatric: Her speech is normal and behavior is normal. Her affect is blunt.    ED Course  Procedures (including critical care time) Labs Review Labs Reviewed - No data to display  Imaging Review No results found.   EKG Interpretation None      MDM   Final diagnoses:  None   spoke with the urologist on call and patient will be seen this week in followup. They recommended no acute intervention at this time      Leota Jacobsen, MD 08/08/13 [phone]

## 2013-08-08 NOTE — Progress Notes (Signed)
  CARE MANAGEMENT ED NOTE 08/08/2013  Patient:  Jacqueline Norman, Jacqueline Norman   Account Number:  [id number]  Date Initiated:  08/08/2013  Documentation initiated by:  Radford Pax  Subjective/Objective Assessment:   Patient presents to Ed with decreased drainage from nephrostomy tube.     Subjective/Objective Assessment Detail:     Action/Plan:   Action/Plan Detail:   Anticipated DC Date:  08/08/2013     Status Recommendation to Physician:   Result of Recommendation:    Other ED Services  Consult Working Plan    DC Planning Services  Other  PCP issues    Choice offered to / List presented to:            Status of service:  Completed, signed off  ED Comments:   ED Comments Detail:  EDCM spoke to patient at bedside.  Patient reports she does not have a pcp.  EDCM provided patient with a list of pcps who accept Medicare insurance within a five mile radius of patient's zip code.  Patient thankful for resources. No further EDCM needs at this time.

## 2013-08-09 ENCOUNTER — Ambulatory Visit (HOSPITAL_COMMUNITY)
Admission: RE | Admit: 2013-08-09 | Discharge: 2013-08-09 | Disposition: A | Discharge status: Home / Self Care | Payer: Medicare Other | Source: Ambulatory Visit | Attending: Urology | Admitting: Urology

## 2013-08-09 ENCOUNTER — Encounter (HOSPITAL_COMMUNITY): Payer: Self-pay

## 2013-08-09 ENCOUNTER — Other Ambulatory Visit (HOSPITAL_COMMUNITY): Payer: Self-pay | Admitting: Urology

## 2013-08-09 DIAGNOSIS — M51379 Other intervertebral disc degeneration, lumbosacral region without mention of lumbar back pain or lower extremity pain: Secondary | ICD-10-CM | POA: Insufficient documentation

## 2013-08-09 DIAGNOSIS — K6812 Psoas muscle abscess: Secondary | ICD-10-CM

## 2013-08-09 DIAGNOSIS — N200 Calculus of kidney: Secondary | ICD-10-CM | POA: Insufficient documentation

## 2013-08-09 DIAGNOSIS — N1330 Unspecified hydronephrosis: Secondary | ICD-10-CM | POA: Insufficient documentation

## 2013-08-09 DIAGNOSIS — K8020 Calculus of gallbladder without cholecystitis without obstruction: Secondary | ICD-10-CM | POA: Insufficient documentation

## 2013-08-09 DIAGNOSIS — J9819 Other pulmonary collapse: Secondary | ICD-10-CM | POA: Insufficient documentation

## 2013-08-09 DIAGNOSIS — M5137 Other intervertebral disc degeneration, lumbosacral region: Secondary | ICD-10-CM | POA: Insufficient documentation

## 2013-08-09 DIAGNOSIS — K651 Peritoneal abscess: Secondary | ICD-10-CM | POA: Insufficient documentation

## 2013-08-09 NOTE — Progress Notes (Signed)
Patient ID: Jacqueline Norman, female   DOB: [birth year], 43 y.o.   MRN: [id number] Pt 's recent CT reviewed by Dr. Deanne CofferHassell. Rt psoas drain tip is in SQ tissue. No sig residual collection present at previously drained psoas region. Following d/w Dr. Brunilda PayorNesi  decision made to remove drain. Rt psoas drain removed in its entirety without immediate complication. Gauze dressing applied to site.

## 2013-08-27 ENCOUNTER — Other Ambulatory Visit (HOSPITAL_COMMUNITY): Payer: Self-pay | Admitting: Urology

## 2013-08-27 DIAGNOSIS — N151 Renal and perinephric abscess: Secondary | ICD-10-CM

## 2013-08-29 ENCOUNTER — Ambulatory Visit (HOSPITAL_COMMUNITY)
Admission: RE | Admit: 2013-08-29 | Discharge: 2013-08-29 | Disposition: A | Discharge status: Home / Self Care | Payer: Medicare Other | Source: Ambulatory Visit | Attending: Urology | Admitting: Urology

## 2013-08-29 DIAGNOSIS — G35 Multiple sclerosis: Secondary | ICD-10-CM | POA: Insufficient documentation

## 2013-08-29 DIAGNOSIS — N2881 Hypertrophy of kidney: Secondary | ICD-10-CM | POA: Diagnosis not present

## 2013-08-29 DIAGNOSIS — N151 Renal and perinephric abscess: Secondary | ICD-10-CM | POA: Insufficient documentation

## 2013-08-29 DIAGNOSIS — N200 Calculus of kidney: Secondary | ICD-10-CM | POA: Insufficient documentation

## 2013-08-29 DIAGNOSIS — K769 Liver disease, unspecified: Secondary | ICD-10-CM | POA: Diagnosis not present

## 2013-08-29 DIAGNOSIS — K8020 Calculus of gallbladder without cholecystitis without obstruction: Secondary | ICD-10-CM | POA: Diagnosis not present

## 2013-08-29 DIAGNOSIS — N1330 Unspecified hydronephrosis: Secondary | ICD-10-CM | POA: Insufficient documentation

## 2013-09-20 ENCOUNTER — Other Ambulatory Visit: Payer: Self-pay | Admitting: Urology

## 2013-10-01 ENCOUNTER — Encounter (HOSPITAL_COMMUNITY): Payer: Self-pay | Admitting: Pharmacist

## 2013-10-01 ENCOUNTER — Encounter (HOSPITAL_COMMUNITY): Payer: Self-pay | Admitting: *Deleted

## 2013-10-01 NOTE — Progress Notes (Addendum)
SPOKE WITH PATIENT AND HER FRIEND Jacqueline Norman BY PHONE TO UPDATE HER MEDICAL HISTORY IN EPIC.  PT HAS MS - IS BEDRIDDEN- DOES NOT HAVE USE OF HER ARMS OR LEGS.  MR. Norman WROTE DOWN HER PREOP INSTRUCTIONS AND CHLORHEXIDINE BATH INSTRUCTIONS / PRECAUTIONS.  HE STATES SHE WILL COME BY AMBULANCE AND HER MOTHER WILL BE WITH HER DAY OF SURGERY AND SIGNS PT'S LEGAL PAPERS.   MR. Norman DOES PLAN TO ARRIVE LATER IN THE DAY TO SEE PT.  I HAVE LEFT A MESSAGE FOR PT'S MOTHER Jacqueline Norman TO CALL ME REGARDING Jacqueline Norman'S SURGERY.

## 2013-10-04 NOTE — Progress Notes (Signed)
Have not heard back from pt's mother regarding pt's surgery. Surgery is tomorrow - chart taken to Short Stay.

## 2013-10-05 ENCOUNTER — Encounter (HOSPITAL_COMMUNITY): Payer: Self-pay | Admitting: *Deleted

## 2013-10-05 ENCOUNTER — Encounter (HOSPITAL_COMMUNITY): Payer: Medicare Other | Admitting: Certified Registered Nurse Anesthetist

## 2013-10-05 ENCOUNTER — Inpatient Hospital Stay (HOSPITAL_COMMUNITY)
Admission: RE | Admit: 2013-10-05 | Discharge: 2013-10-08 | DRG: 659 | Disposition: A | Discharge status: Home / Self Care | Payer: Medicare Other | Source: Ambulatory Visit | Attending: Urology | Admitting: Urology

## 2013-10-05 ENCOUNTER — Encounter (HOSPITAL_COMMUNITY)
Admission: RE | Disposition: A | Discharge status: Home / Self Care | Payer: Self-pay | Source: Ambulatory Visit | Attending: Urology

## 2013-10-05 ENCOUNTER — Inpatient Hospital Stay (HOSPITAL_COMMUNITY): Payer: Medicare Other | Admitting: Certified Registered Nurse Anesthetist

## 2013-10-05 DIAGNOSIS — Z87891 Personal history of nicotine dependence: Secondary | ICD-10-CM | POA: Diagnosis not present

## 2013-10-05 DIAGNOSIS — N12 Tubulo-interstitial nephritis, not specified as acute or chronic: Principal | ICD-10-CM | POA: Diagnosis present

## 2013-10-05 DIAGNOSIS — G35 Multiple sclerosis: Secondary | ICD-10-CM | POA: Diagnosis present

## 2013-10-05 DIAGNOSIS — Z7401 Bed confinement status: Secondary | ICD-10-CM

## 2013-10-05 DIAGNOSIS — E039 Hypothyroidism, unspecified: Secondary | ICD-10-CM | POA: Diagnosis present

## 2013-10-05 DIAGNOSIS — K6812 Psoas muscle abscess: Secondary | ICD-10-CM | POA: Diagnosis present

## 2013-10-05 DIAGNOSIS — N151 Renal and perinephric abscess: Secondary | ICD-10-CM | POA: Diagnosis present

## 2013-10-05 DIAGNOSIS — Z993 Dependence on wheelchair: Secondary | ICD-10-CM

## 2013-10-05 DIAGNOSIS — N118 Other chronic tubulo-interstitial nephritis: Secondary | ICD-10-CM | POA: Diagnosis present

## 2013-10-05 DIAGNOSIS — N269 Renal sclerosis, unspecified: Secondary | ICD-10-CM | POA: Diagnosis present

## 2013-10-05 DIAGNOSIS — N200 Calculus of kidney: Secondary | ICD-10-CM | POA: Diagnosis present

## 2013-10-05 DIAGNOSIS — Z6834 Body mass index (BMI) 34.0-34.9, adult: Secondary | ICD-10-CM

## 2013-10-05 HISTORY — DX: Anemia, unspecified: D64.9

## 2013-10-05 HISTORY — DX: Other chronic tubulo-interstitial nephritis: N11.8

## 2013-10-05 HISTORY — PX: ROBOT ASSISTED LAPAROSCOPIC NEPHRECTOMY: SHX5140

## 2013-10-05 HISTORY — DX: Hypothyroidism, unspecified: E03.9

## 2013-10-05 HISTORY — DX: Renal and perinephric abscess: N15.1

## 2013-10-05 LAB — BASIC METABOLIC PANEL
Anion gap: 11 (ref 5–15)
BUN: 9 mg/dL (ref 6–23)
CO2: 24 meq/L (ref 19–32)
Calcium: 9.4 mg/dL (ref 8.4–10.5)
Chloride: 101 mEq/L (ref 96–112)
Creatinine, Ser: 0.69 mg/dL (ref 0.50–1.10)
GFR calc Af Amer: >90 mL/min (ref >90)
GFR calc non Af Amer: >90 mL/min (ref >90)
GLUCOSE: 104 mg/dL — AB (ref 70–99)
POTASSIUM: 3.6 meq/L — AB (ref 3.7–5.3)
Sodium: 136 mEq/L — ABNORMAL LOW (ref 137–147)

## 2013-10-05 LAB — CBC
HCT: 34.8 % — ABNORMAL LOW (ref 36.0–46.0)
HEMOGLOBIN: 10.9 g/dL — AB (ref 12.0–15.0)
MCH: 24.3 pg — ABNORMAL LOW (ref 26.0–34.0)
MCHC: 31.3 g/dL (ref 30.0–36.0)
MCV: 77.7 fL — ABNORMAL LOW (ref 78.0–100.0)
Platelets: 281 10*3/uL (ref 150–400)
RBC: 4.48 MIL/uL (ref 3.87–5.11)
RDW: 20 % — ABNORMAL HIGH (ref 11.5–15.5)
WBC: 4.8 10*3/uL (ref 4.0–10.5)

## 2013-10-05 LAB — HCG, SERUM, QUALITATIVE: PREG SERUM: NEGATIVE

## 2013-10-05 LAB — HEMOGLOBIN AND HEMATOCRIT, BLOOD
HCT: 32.9 % — ABNORMAL LOW (ref 36.0–46.0)
Hemoglobin: 10.2 g/dL — ABNORMAL LOW (ref 12.0–15.0)

## 2013-10-05 LAB — PREPARE RBC (CROSSMATCH)

## 2013-10-05 SURGERY — ROBOTIC ASSISTED LAPAROSCOPIC NEPHRECTOMY
Anesthesia: General | Laterality: Right

## 2013-10-05 MED ORDER — BACLOFEN 10 MG PO TABS
10.0000 mg | ORAL_TABLET | Freq: Two times a day (BID) | ORAL | Status: DC
  Administered 2013-10-05 – 2013-10-07 (×5): 10 mg via ORAL
  Filled 2013-10-05 (×7): qty 1

## 2013-10-05 MED ORDER — STERILE WATER FOR IRRIGATION IR SOLN
Status: DC | PRN
  Administered 2013-10-05: 3000 mL

## 2013-10-05 MED ORDER — LIDOCAINE HCL 1 % IJ SOLN
INTRAMUSCULAR | Status: DC | PRN
  Administered 2013-10-05: 80 mg via INTRADERMAL

## 2013-10-05 MED ORDER — VITAMINS A & D EX OINT
TOPICAL_OINTMENT | CUTANEOUS | Status: AC
  Administered 2013-10-05: 5
  Filled 2013-10-05: qty 5

## 2013-10-05 MED ORDER — LACTATED RINGERS IV SOLN
INTRAVENOUS | Status: DC
  Administered 2013-10-05: 1000 mL via INTRAVENOUS
  Administered 2013-10-05 (×2): via INTRAVENOUS

## 2013-10-05 MED ORDER — INTERFERON BETA-1B 0.3 MG ~~LOC~~ KIT
0.3000 mg | PACK | SUBCUTANEOUS | Status: DC
  Filled 2013-10-05: qty 0.3

## 2013-10-05 MED ORDER — BENZTROPINE MESYLATE 1 MG PO TABS
1.0000 mg | ORAL_TABLET | Freq: Two times a day (BID) | ORAL | Status: DC
  Administered 2013-10-05 – 2013-10-07 (×5): 1 mg via ORAL
  Filled 2013-10-05 (×7): qty 1

## 2013-10-05 MED ORDER — BUPIVACAINE LIPOSOME 1.3 % IJ SUSP
20.0000 mL | Freq: Once | INTRAMUSCULAR | Status: AC
  Administered 2013-10-05: 40 mL
  Filled 2013-10-05: qty 20

## 2013-10-05 MED ORDER — ACETAMINOPHEN 500 MG PO TABS
1000.0000 mg | ORAL_TABLET | Freq: Four times a day (QID) | ORAL | Status: AC
  Administered 2013-10-05 – 2013-10-06 (×2): 1000 mg via ORAL
  Filled 2013-10-05 (×4): qty 2

## 2013-10-05 MED ORDER — LABETALOL HCL 5 MG/ML IV SOLN
INTRAVENOUS | Status: DC | PRN
  Administered 2013-10-05: 5 mg via INTRAVENOUS

## 2013-10-05 MED ORDER — FERROUS SULFATE 325 (65 FE) MG PO TABS
325.0000 mg | ORAL_TABLET | Freq: Three times a day (TID) | ORAL | Status: DC
  Administered 2013-10-06 – 2013-10-08 (×7): 325 mg via ORAL
  Filled 2013-10-05 (×10): qty 1

## 2013-10-05 MED ORDER — POTASSIUM CHLORIDE CRYS ER 20 MEQ PO TBCR
20.0000 meq | EXTENDED_RELEASE_TABLET | Freq: Every morning | ORAL | Status: DC
  Administered 2013-10-06 – 2013-10-07 (×2): 20 meq via ORAL
  Filled 2013-10-05 (×3): qty 1

## 2013-10-05 MED ORDER — OMEGA-3-ACID ETHYL ESTERS 1 G PO CAPS
1.0000 g | ORAL_CAPSULE | Freq: Two times a day (BID) | ORAL | Status: DC
  Administered 2013-10-05 – 2013-10-07 (×5): 1 g via ORAL
  Filled 2013-10-05 (×7): qty 1

## 2013-10-05 MED ORDER — SUCCINYLCHOLINE CHLORIDE 20 MG/ML IJ SOLN
INTRAMUSCULAR | Status: DC | PRN
  Administered 2013-10-05: 100 mg via INTRAVENOUS

## 2013-10-05 MED ORDER — CISATRACURIUM BESYLATE 20 MG/10ML IV SOLN
INTRAVENOUS | Status: AC
  Filled 2013-10-05: qty 10

## 2013-10-05 MED ORDER — HYDROMORPHONE HCL PF 1 MG/ML IJ SOLN
0.5000 mg | INTRAMUSCULAR | Status: DC | PRN
  Administered 2013-10-05 – 2013-10-08 (×10): 1 mg via INTRAVENOUS
  Filled 2013-10-05 (×10): qty 1

## 2013-10-05 MED ORDER — SODIUM CHLORIDE 0.9 % IV SOLN
INTRAVENOUS | Status: DC | PRN
  Administered 2013-10-05: 13:00:00 via INTRAVENOUS

## 2013-10-05 MED ORDER — CISATRACURIUM BESYLATE (PF) 10 MG/5ML IV SOLN
INTRAVENOUS | Status: DC | PRN
  Administered 2013-10-05: 4 mg via INTRAVENOUS
  Administered 2013-10-05: 8 mg via INTRAVENOUS
  Administered 2013-10-05: 2 mg via INTRAVENOUS
  Administered 2013-10-05 (×2): 6 mg via INTRAVENOUS

## 2013-10-05 MED ORDER — MEPERIDINE HCL 50 MG/ML IJ SOLN: 6.2500 mg | INTRAMUSCULAR | Status: DC | PRN

## 2013-10-05 MED ORDER — PHENYLEPHRINE 40 MCG/ML (10ML) SYRINGE FOR IV PUSH (FOR BLOOD PRESSURE SUPPORT)
PREFILLED_SYRINGE | INTRAVENOUS | Status: AC
  Filled 2013-10-05: qty 10

## 2013-10-05 MED ORDER — SODIUM CHLORIDE 0.9 % IJ SOLN
INTRAMUSCULAR | Status: AC
  Filled 2013-10-05: qty 3

## 2013-10-05 MED ORDER — PROMETHAZINE HCL 25 MG/ML IJ SOLN: 6.2500 mg | INTRAMUSCULAR | Status: DC | PRN

## 2013-10-05 MED ORDER — PHENYLEPHRINE HCL 10 MG/ML IJ SOLN
INTRAMUSCULAR | Status: DC | PRN
  Administered 2013-10-05 (×2): 80 ug via INTRAVENOUS

## 2013-10-05 MED ORDER — ONDANSETRON HCL 4 MG/2ML IJ SOLN
INTRAMUSCULAR | Status: DC | PRN
  Administered 2013-10-05: 4 mg via INTRAVENOUS

## 2013-10-05 MED ORDER — CIPROFLOXACIN HCL 500 MG PO TABS
500.0000 mg | ORAL_TABLET | Freq: Two times a day (BID) | ORAL | Status: AC
  Administered 2013-10-05 – 2013-10-07 (×4): 500 mg via ORAL
  Filled 2013-10-05 (×4): qty 1

## 2013-10-05 MED ORDER — FENTANYL CITRATE 0.05 MG/ML IJ SOLN
INTRAMUSCULAR | Status: AC
  Filled 2013-10-05: qty 5

## 2013-10-05 MED ORDER — OXYCODONE HCL 5 MG PO TABS: 5.0000 mg | ORAL_TABLET | Freq: Once | ORAL | Status: DC | PRN

## 2013-10-05 MED ORDER — PIPERACILLIN-TAZOBACTAM IN DEX 2-0.25 GM/50ML IV SOLN: 2.2500 g | Freq: Three times a day (TID) | INTRAVENOUS | Status: DC

## 2013-10-05 MED ORDER — LACTATED RINGERS IV SOLN
INTRAVENOUS | Status: DC | PRN
  Administered 2013-10-05: 14:00:00 via INTRAVENOUS

## 2013-10-05 MED ORDER — HYDROMORPHONE HCL PF 2 MG/ML IJ SOLN
INTRAMUSCULAR | Status: AC
  Filled 2013-10-05: qty 1

## 2013-10-05 MED ORDER — MIDAZOLAM HCL 5 MG/5ML IJ SOLN
INTRAMUSCULAR | Status: DC | PRN
  Administered 2013-10-05 (×2): 1 mg via INTRAVENOUS

## 2013-10-05 MED ORDER — FENTANYL CITRATE 0.05 MG/ML IJ SOLN
INTRAMUSCULAR | Status: DC | PRN
  Administered 2013-10-05 (×4): 50 ug via INTRAVENOUS
  Administered 2013-10-05 (×2): 100 ug via INTRAVENOUS
  Administered 2013-10-05: 50 ug via INTRAVENOUS
  Administered 2013-10-05 (×2): 100 ug via INTRAVENOUS

## 2013-10-05 MED ORDER — PROPOFOL 10 MG/ML IV BOLUS
INTRAVENOUS | Status: DC | PRN
  Administered 2013-10-05: 100 mg via INTRAVENOUS
  Administered 2013-10-05 (×2): 50 mg via INTRAVENOUS

## 2013-10-05 MED ORDER — PIPERACILLIN-TAZOBACTAM 3.375 G IVPB
3.3750 g | Freq: Three times a day (TID) | INTRAVENOUS | Status: AC
  Administered 2013-10-05 – 2013-10-07 (×6): 3.375 g via INTRAVENOUS
  Filled 2013-10-05 (×8): qty 50

## 2013-10-05 MED ORDER — PIPERACILLIN-TAZOBACTAM 3.375 G IVPB 30 MIN
3.3750 g | INTRAVENOUS | Status: AC
  Administered 2013-10-05: 3.375 g via INTRAVENOUS

## 2013-10-05 MED ORDER — GLYCOPYRROLATE 0.2 MG/ML IJ SOLN
INTRAMUSCULAR | Status: DC | PRN
  Administered 2013-10-05: .8 mg via INTRAVENOUS

## 2013-10-05 MED ORDER — NEOSTIGMINE METHYLSULFATE 10 MG/10ML IV SOLN
INTRAVENOUS | Status: DC | PRN
  Administered 2013-10-05: 5 mg via INTRAVENOUS

## 2013-10-05 MED ORDER — SIMVASTATIN 5 MG PO TABS
5.0000 mg | ORAL_TABLET | Freq: Every day | ORAL | Status: DC
  Administered 2013-10-05 – 2013-10-07 (×3): 5 mg via ORAL
  Filled 2013-10-05 (×4): qty 1

## 2013-10-05 MED ORDER — PROPOFOL 10 MG/ML IV BOLUS
INTRAVENOUS | Status: AC
  Filled 2013-10-05: qty 20

## 2013-10-05 MED ORDER — SODIUM CHLORIDE 0.9 % IJ SOLN
INTRAMUSCULAR | Status: AC
  Filled 2013-10-05: qty 20

## 2013-10-05 MED ORDER — PHENYLEPHRINE HCL 10 MG/ML IJ SOLN
INTRAMUSCULAR | Status: AC
  Filled 2013-10-05: qty 1

## 2013-10-05 MED ORDER — DOCUSATE SODIUM 100 MG PO CAPS
100.0000 mg | ORAL_CAPSULE | Freq: Every day | ORAL | Status: DC | PRN
  Filled 2013-10-05: qty 1

## 2013-10-05 MED ORDER — LACTATED RINGERS IR SOLN
Status: DC | PRN
  Administered 2013-10-05: 1000 mL

## 2013-10-05 MED ORDER — OXYCODONE HCL 5 MG/5ML PO SOLN
5.0000 mg | Freq: Once | ORAL | Status: DC | PRN
  Filled 2013-10-05: qty 5

## 2013-10-05 MED ORDER — HYDROMORPHONE HCL PF 1 MG/ML IJ SOLN: 0.2500 mg | INTRAMUSCULAR | Status: DC | PRN

## 2013-10-05 MED ORDER — PHENYLEPHRINE HCL 10 MG/ML IJ SOLN
10.0000 mg | INTRAMUSCULAR | Status: DC | PRN
  Administered 2013-10-05: 10 ug/min via INTRAVENOUS

## 2013-10-05 MED ORDER — LEVOTHYROXINE SODIUM 50 MCG PO TABS
50.0000 ug | ORAL_TABLET | Freq: Every day | ORAL | Status: DC
  Administered 2013-10-06 – 2013-10-08 (×3): 50 ug via ORAL
  Filled 2013-10-05 (×4): qty 1

## 2013-10-05 MED ORDER — MIDAZOLAM HCL 2 MG/2ML IJ SOLN
INTRAMUSCULAR | Status: AC
  Filled 2013-10-05: qty 2

## 2013-10-05 MED ORDER — OXYCODONE-ACETAMINOPHEN 5-325 MG PO TABS
1.0000 | ORAL_TABLET | ORAL | Status: DC | PRN
  Administered 2013-10-05 – 2013-10-08 (×9): 2 via ORAL
  Filled 2013-10-05 (×9): qty 2

## 2013-10-05 MED ORDER — PIPERACILLIN-TAZOBACTAM 3.375 G IVPB
INTRAVENOUS | Status: AC
Start: 2013-10-05 — End: 2013-10-05
  Filled 2013-10-05: qty 50

## 2013-10-05 SURGICAL SUPPLY — 51 items
CABLE HI FREQUENCY MONOPOLAR (ELECTROSURGICAL) ×3 IMPLANT
CHLORAPREP W/TINT 26ML (MISCELLANEOUS) ×3 IMPLANT
CLIP LIGATING HEM O LOK PURPLE (MISCELLANEOUS) ×6 IMPLANT
CLIP LIGATING HEMO LOK XL GOLD (MISCELLANEOUS) ×6 IMPLANT
CLIP LIGATING HEMO O LOK GREEN (MISCELLANEOUS) ×3 IMPLANT
CORDS BIPOLAR (ELECTRODE) ×3 IMPLANT
COVER SURGICAL LIGHT HANDLE (MISCELLANEOUS) ×3 IMPLANT
COVER TIP SHEARS 8 DVNC (MISCELLANEOUS) ×1 IMPLANT
COVER TIP SHEARS 8MM DA VINCI (MISCELLANEOUS) ×2
CUTTER ECHEON FLEX ENDO 45 340 (ENDOMECHANICALS) ×3 IMPLANT
DECANTER SPIKE VIAL GLASS SM (MISCELLANEOUS) IMPLANT
DERMABOND ADVANCED (GAUZE/BANDAGES/DRESSINGS) ×4
DERMABOND ADVANCED .7 DNX12 (GAUZE/BANDAGES/DRESSINGS) ×2 IMPLANT
DRAIN CHANNEL 15F RND FF 3/16 (WOUND CARE) ×3 IMPLANT
DRAPE INCISE IOBAN 66X45 STRL (DRAPES) ×3 IMPLANT
DRAPE LAPAROSCOPIC ABDOMINAL (DRAPES) ×3 IMPLANT
DRAPE LG THREE QUARTER DISP (DRAPES) ×6 IMPLANT
DRAPE TABLE BACK 44X90 PK DISP (DRAPES) ×3 IMPLANT
DRAPE WARM FLUID 44X44 (DRAPE) ×3 IMPLANT
ELECT REM PT RETURN 9FT ADLT (ELECTROSURGICAL) ×3
ELECTRODE REM PT RTRN 9FT ADLT (ELECTROSURGICAL) ×1 IMPLANT
EVACUATOR SILICONE 100CC (DRAIN) ×3 IMPLANT
GLOVE BIOGEL M STRL SZ7.5 (GLOVE) ×6 IMPLANT
GOWN STRL REUS W/TWL LRG LVL3 (GOWN DISPOSABLE) ×15 IMPLANT
KIT ACCESSORY DA VINCI DISP (KITS) ×2
KIT ACCESSORY DVNC DISP (KITS) ×1 IMPLANT
KIT BASIN OR (CUSTOM PROCEDURE TRAY) ×3 IMPLANT
NEEDLE INSUFFLATION 14GA 120MM (NEEDLE) ×3 IMPLANT
PENCIL BUTTON HOLSTER BLD 10FT (ELECTRODE) ×3 IMPLANT
POSITIONER SURGICAL ARM (MISCELLANEOUS) ×6 IMPLANT
POUCH ENDO CATCH II 15MM (MISCELLANEOUS) ×3 IMPLANT
RELOAD WH ECHELON 45 (STAPLE) ×12 IMPLANT
SET TUBE IRRIG SUCTION NO TIP (IRRIGATION / IRRIGATOR) ×3 IMPLANT
SOLUTION ANTI FOG 6CC (MISCELLANEOUS) ×3 IMPLANT
SOLUTION ELECTROLUBE (MISCELLANEOUS) ×3 IMPLANT
SPONGE LAP 18X18 X RAY DECT (DISPOSABLE) ×3 IMPLANT
SPONGE LAP 4X18 X RAY DECT (DISPOSABLE) ×3 IMPLANT
SUT ETHILON 3 0 PS 1 (SUTURE) ×9 IMPLANT
SUT MNCRL AB 4-0 PS2 18 (SUTURE) ×6 IMPLANT
SUT PDS AB 1 CT1 27 (SUTURE) ×15 IMPLANT
SUT VIC AB 2-0 SH 27 (SUTURE) ×2
SUT VIC AB 2-0 SH 27X BRD (SUTURE) ×1 IMPLANT
SUT VICRYL 0 UR6 27IN ABS (SUTURE) ×3 IMPLANT
SYR BULB IRRIGATION 50ML (SYRINGE) IMPLANT
TOWEL OR NON WOVEN STRL DISP B (DISPOSABLE) ×6 IMPLANT
TRAY FOLEY CATH 14FRSI W/METER (CATHETERS) ×3 IMPLANT
TRAY LAP CHOLE (CUSTOM PROCEDURE TRAY) ×3 IMPLANT
TROCAR 12M 150ML BLUNT (TROCAR) ×3 IMPLANT
TROCAR XCEL 12X100 BLDLESS (ENDOMECHANICALS) ×3 IMPLANT
TUBING INSUFFLATION 10FT LAP (TUBING) ×3 IMPLANT
WATER STERILE IRR 1500ML POUR (IV SOLUTION) ×6 IMPLANT

## 2013-10-05 NOTE — Brief Op Note (Signed)
10/05/2013  4:05 PM  PATIENT:  Jacqueline Norman  43 y.o. female  PRE-OPERATIVE DIAGNOSIS:  RIGHT  XANTHOGRANULOMATOUS PYELONEPHRITIS, PERINEPHRIC ABCESS  POST-OPERATIVE DIAGNOSIS:  RIGHT XANTHOGRANULOMATOUS PYELONEPHRITIS,     PROCEDURE:  Procedure(s): ROBOTIC ASSISTED LAPAROSCOPIC RADICAL NEPHRECTOMY (Right)  SURGEON:  Surgeon(s) and Role:    * Sebastian Ache, MD - Primary  PHYSICIAN ASSISTANT:   ASSISTANTS: Lujean Rave, PA    ANESTHESIA:   general  EBL:  Total I/O In: 3000 [I.V.:3000] Out: 825 [Urine:475; Blood:350]  BLOOD ADMINISTERED:none  DRAINS: foley, JP   LOCAL MEDICATIONS USED:  MARCAINE     SPECIMEN:  Source of Specimen:  Right Kidney  DISPOSITION OF SPECIMEN:  PATHOLOGY  COUNTS:  YES  TOURNIQUET:  * No tourniquets in log *  DICTATION: .Other Dictation: Dictation Number [id number]  PLAN OF CARE: Admit to inpatient   PATIENT DISPOSITION:  PACU - hemodynamically stable.   Delay start of Pharmacological VTE agent (>24hrs) due to surgical blood loss or risk of bleeding: yes

## 2013-10-05 NOTE — Anesthesia Postprocedure Evaluation (Signed)
Anesthesia Post Note  Patient: Jacqueline Norman  Procedure(s) Performed: Procedure(s) (LRB): ROBOTIC ASSISTED LAPAROSCOPIC RADICAL NEPHRECTOMY (Right)  Anesthesia type: General  Patient location: PACU  Post pain: Pain level controlled  Post assessment: Post-op Vital signs reviewed  Last Vitals: BP 134/86  Pulse 91  Temp(Src) 36.2 C (Oral)  Resp 12  Ht 5\' 4"  (1.626 m)  Wt 200 lb (90.719 kg)  BMI 34.31 kg/m2  SpO2 98%  LMP 09/24/2013  Post vital signs: Reviewed  Level of consciousness: sedated  Complications: No apparent anesthesia complications'

## 2013-10-05 NOTE — H&P (Signed)
Jacqueline Norman is an 43 y.o. female.    Chief Complaint: Pre-Op Right Radical Nephrectomy  HPI:   1 - Right Xanthogranulomatous Pyelonephritis with Psoas Abscess - Pt with large right psoas abcess and XGP kidney found 07/2013 on eval flank and abdominal pain. Initially manages with abscess drain which reduced psoas abcess size some, but then drain malpositioned / removed. CX pan-sensitive proteus (sens amp, FQ, Keflex, etc..) Appears 1 artery / 1 vein renovascular anatomy.  2 - Nephrolithiasis - Rt large stones c/w XGP, left punctate <46mm lower pole by CT 08/2013.  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 No piror surgery.   Today Jacqueline Norman is seen to proceed with right nephrectomy for her non-functional chronically infected kidney.  Her PCP is Florentina Jenny with [facility]. Her neurologist is Dr. Anne Hahn. No interval fevers.  Past Medical History  Diagnosis Date  . MS (multiple sclerosis)     BED BOUND -UP IN W/C USING HOYER LIFT- UNABLE TO MOVE ARMS AND LEGS - NOT ABLE TO FEED SELF- NO TROUBLE SWALLOWING - STATES SHE EATS WELL; INCONTINENT - WEARS DEPENDS  . Xanthogranulomatous pyelonephritis     RIGHT   . Perinephric abscess     HOSPITALIZED 07/29/13 TO 08/05/13.WITH SEPSIS  . Hypothyroidism   . Anemia     Past Surgical History  Procedure Laterality Date  . No past surgeries      History reviewed. No pertinent family history. Social History:  reports that she quit smoking about 16 years ago. She has never used smokeless tobacco. She reports that she does not drink alcohol or use illicit drugs.  Allergies: No Known Allergies  No prescriptions prior to admission    No results found for this or any previous visit (from the past 48 hour(s)). No results found.  Review of Systems  Constitutional: Negative.  Negative for fever and chills.  HENT: Negative.   Eyes: Negative.   Respiratory: Negative.    Cardiovascular: Negative.   Gastrointestinal: Negative.   Genitourinary: Negative.   Musculoskeletal: Negative.   Skin: Negative.   Neurological: Negative.   Endo/Heme/Allergies: Negative.   Psychiatric/Behavioral: Negative.     Last menstrual period 09/24/2013. Physical Exam  Constitutional: She is oriented to person, place, and time. She appears well-developed and well-nourished.  Stigmata of obesity and MS  HENT:  Head: Normocephalic.  Eyes: Pupils are equal, round, and reactive to light.  Neck: Normal range of motion. Neck supple.  Cardiovascular: Normal rate.   Respiratory: Effort normal.  GI: Soft.  Genitourinary:  No CVAT  Musculoskeletal: Normal range of motion.  Neurological: She is alert and oriented to person, place, and time.  Skin: Skin is warm and dry.  Psychiatric: She has a normal mood and affect. Her behavior is normal. Judgment and thought content normal.     Assessment/Plan   1 - Right Xanthogranulomatous Pyelonephritis with Psoas Abscess - Very big and dangerous problem long term. Clearly needs nephrectomy as this is only option for long term resolution. She is terrible operative candidate with morbid obesity, poor baseline functional status. Estimate perioperative mortality / sig morbidity 5-10%.  She has been medically optimized with prior abscess drain and peri-op CX-specific ABX.  We rediscussed the role of radical nephrectomy with the overall goal of complete surgical excision (negative margins) and better staging / diagnosis. We specifically discussed that with removal of the kidney there would be an overall renal function decline with attendant  risks of renal failure and need for dialysis in some cases, and need for kidney-friendly lifestyle post-op with excellent blood pressure and glycemic control. We then rediscussed surgical approaches including robotic and open techniques with robotic associated with a shorter convalescence. I showed the patient on  their abdomen the approximately 4-6 incision (trocar) sites as well as presumed extraction sites with robotic approach as well as possible open incision sites. We specifically readdressed that there may be need to alter operative plans according to intraopertive findings including conversion to open procedure. We rediscussed specific peri-operative risks including bleeding, infection, deep vein thrombosis, pulmonary embolism, compartment syndrome, nuropathy / neuropraxia, heart attack, stroke, death, as well as long-term risks such as non-cure / need for additional therapy and need for imaging and lab based post-op surveillance protocols. We rediscussed typical hospital course of approximately 2 day hospitalization, need for peri-operative drains / catheters, and typical post-hospital course with return to most non-strenuous activities by 2 weeks and ability to return to most jobs and more strenuous activity such as exercise by 6 weeks.    After this lengthy and detail discussion, including answering all of the patient's questions to their satisfaction, they have chosen to proceed with right robotic radical nephrectomy as planned today.   2 - Nephrolithiasis - no role for endoscopic management on right side. Lt side lower pole and punctate (low risk), observe.  ,  10/05/2013, 6:20 AM

## 2013-10-05 NOTE — Transfer of Care (Signed)
Immediate Anesthesia Transfer of Care Note  Patient: Jacqueline Norman  Procedure(s) Performed: Procedure(s): ROBOTIC ASSISTED LAPAROSCOPIC RADICAL NEPHRECTOMY (Right)  Patient Location: PACU  Anesthesia Type:General  Level of Consciousness: awake, alert , oriented and patient cooperative  Airway & Oxygen Therapy: Patient Spontanous Breathing and Patient connected to face mask oxygen  Post-op Assessment: Report given to PACU RN and Post -op Vital signs reviewed and stable  Post vital signs: stable  Complications: No apparent anesthesia complications  Was breathing for 15 minutes prior to extubation TV 350 or greater, following commands

## 2013-10-05 NOTE — Anesthesia Preprocedure Evaluation (Addendum)
Anesthesia Evaluation  Patient identified by MRN, date of birth, ID band Patient awake    Reviewed: Allergy & Precautions, H&P , NPO status , Patient's Chart, lab work & pertinent test results  Airway Mallampati: II TM Distance: >3 FB Neck ROM: Full    Dental no notable dental hx.    Pulmonary neg pulmonary ROS, former smoker,  breath sounds clear to auscultation  Pulmonary exam normal       Cardiovascular negative cardio ROS  Rhythm:Regular Rate:Normal     Neuro/Psych Severe multiple sclerosis. Tetraparetic.  Neuromuscular disease negative psych ROS   GI/Hepatic negative GI ROS, Neg liver ROS,   Endo/Other  Hypothyroidism Morbid obesity  Renal/GU Renal disease     Musculoskeletal negative musculoskeletal ROS (+)   Abdominal (+) + obese,   Peds  Hematology  (+) anemia ,   Anesthesia Other Findings   Reproductive/Obstetrics negative OB ROS                         Anesthesia Physical Anesthesia Plan  ASA: IV  Anesthesia Plan: General   Post-op Pain Management:    Induction: Intravenous  Airway Management Planned: Oral ETT  Additional Equipment: Arterial line  Intra-op Plan:   Post-operative Plan: Extubation in OR and Possible Post-op intubation/ventilation  Informed Consent: I have reviewed the patients History and Physical, chart, labs and discussed the procedure including the risks, benefits and alternatives for the proposed anesthesia with the patient or authorized representative who has indicated his/her understanding and acceptance.   Dental advisory given  Plan Discussed with: CRNA  Anesthesia Plan Comments: (Possible CVL in OR)       Anesthesia Quick Evaluation

## 2013-10-05 NOTE — Progress Notes (Signed)
Pharmacy - Brief Note Pharmacy may renally adjust post-operative antibiotics  73 YOF s/p R nephrectomy, ordered cipro 500mg  PO BID x 4 doses and zosyn 2.25gm IV q8h x 6 doses  preop SCr = 0.69  A/P: Change zosyn to zosyn 3.375gm IV q8h over 4h infusion x 2 days (duration as previously ordered)  Juliette Alcide, PharmD, BCPS.   Pager: [phone]  10/05/2013 6:50 PM

## 2013-10-05 NOTE — Discharge Instructions (Signed)

## 2013-10-06 LAB — BASIC METABOLIC PANEL
Anion gap: 13 (ref 5–15)
BUN: 5 mg/dL — ABNORMAL LOW (ref 6–23)
CHLORIDE: 98 meq/L (ref 96–112)
CO2: 23 meq/L (ref 19–32)
CREATININE: 0.67 mg/dL (ref 0.50–1.10)
Calcium: 8.6 mg/dL (ref 8.4–10.5)
GFR calc Af Amer: >90 mL/min (ref >90)
GFR calc non Af Amer: >90 mL/min (ref >90)
Glucose, Bld: 125 mg/dL — ABNORMAL HIGH (ref 70–99)
Potassium: 3.8 mEq/L (ref 3.7–5.3)
Sodium: 134 mEq/L — ABNORMAL LOW (ref 137–147)

## 2013-10-06 LAB — HEMOGLOBIN AND HEMATOCRIT, BLOOD
HCT: 33.6 % — ABNORMAL LOW (ref 36.0–46.0)
Hemoglobin: 10.2 g/dL — ABNORMAL LOW (ref 12.0–15.0)

## 2013-10-06 MED ORDER — DEXTROSE-NACL 5-0.9 % IV SOLN
INTRAVENOUS | Status: DC
  Administered 2013-10-06 – 2013-10-07 (×3): via INTRAVENOUS

## 2013-10-06 MED ORDER — ONDANSETRON HCL 4 MG/2ML IJ SOLN
4.0000 mg | INTRAMUSCULAR | Status: DC | PRN
  Administered 2013-10-06: 4 mg via INTRAVENOUS
  Filled 2013-10-06: qty 2

## 2013-10-06 MED ORDER — SODIUM CHLORIDE 0.9 % IV SOLN: INTRAVENOUS | Status: DC

## 2013-10-06 NOTE — Progress Notes (Signed)
1 Day Post-Op Subjective: Patient reports abdominal soreness but no nausea or vomiting.  Objective: Vital signs in last 24 hours: Temp:  [97.1 F (36.2 C)-98.8 F (37.1 C)] 98.8 F (37.1 C) (08/29 0509) Pulse Rate:  [77-134] 124 (08/29 0509) Resp:  [10-16] 14 (08/29 0509) BP: (110-137)/(74-94) 128/86 mmHg (08/29 0509) SpO2:  [97 %-100 %] 100 % (08/29 0509) Arterial Line BP: (120-125)/(78-88) 125/88 mmHg (08/28 1715) Weight:  [90.719 kg (200 lb)] 90.719 kg (200 lb) (08/28 0913)  Intake/Output from previous day: 08/28 0701 - 08/29 0700 In: 4100 [I.V.:4100] Out: 2620 [Urine:2125; Drains:145; Blood:350] Intake/Output this shift:    Physical Exam:  Her abdomen is soft and nontender. She has hypoactive bowel sounds. Her Foley output is clear. Lab Results:  Recent Labs  10/05/13 0938 10/05/13 1715 10/06/13 0418  HGB 10.9* 10.2* 10.2*  HCT 34.8* 32.9* 33.6*   BMET  Recent Labs  10/05/13 0938 10/06/13 0418  NA 136* 134*  K 3.6* 3.8  CL 101 98  CO2 24 23  GLUCOSE 104* 125*  BUN 9 5*  CREATININE 0.69 0.67  CALCIUM 9.4 8.6   No results found for this basename: LABPT, INR,  in the last 72 hours No results found for this basename: LABURIN,  in the last 72 hours Results for orders placed during the hospital encounter of 07/29/13  URINE CULTURE     Status: None   Collection Time    07/29/13  4:54 PM      Result Value Ref Range Status   Specimen Description URINE, RANDOM   Final   Special Requests NONE   Final   Culture  Setup Time     Final   Value: 07/30/2013 11:10     Performed at [facility] Count     Final   Value: >=100,000 COLONIES/ML     Performed at [facility]   Culture     Final   Value: Multiple bacterial morphotypes present, none predominant. Suggest appropriate recollection if clinically indicated.     Performed at [facility]   Report Status 07/31/2013 FINAL   Final  CULTURE, BLOOD (ROUTINE X 2)     Status: None    Collection Time    07/29/13  9:03 PM      Result Value Ref Range Status   Specimen Description BLOOD RIGHT HAND   Final   Special Requests BOTTLES DRAWN AEROBIC AND ANAEROBIC 5CC   Final   Culture  Setup Time     Final   Value: 07/30/2013 04:34     Performed at [facility]   Culture     Final   Value: NO GROWTH 5 DAYS     Performed at [facility]   Report Status 08/05/2013 FINAL   Final  CULTURE, BLOOD (ROUTINE X 2)     Status: None   Collection Time    07/29/13  9:04 PM      Result Value Ref Range Status   Specimen Description BLOOD RIGHT ARM   Final   Special Requests BOTTLES DRAWN AEROBIC ONLY 3CC   Final   Culture  Setup Time     Final   Value: 07/30/2013 04:34     Performed at [facility]   Culture     Final   Value: NO GROWTH 5 DAYS     Performed at [facility]   Report Status 08/05/2013 FINAL   Final  MRSA PCR SCREENING  Status: None   Collection Time    07/29/13 10:53 PM      Result Value Ref Range Status   MRSA by PCR NEGATIVE  NEGATIVE Final   Comment:            The GeneXpert MRSA Assay (FDA     approved for NASAL specimens     only), is one component of a     comprehensive MRSA colonization     surveillance program. It is not     intended to diagnose MRSA     infection nor to guide or     monitor treatment for     MRSA infections.  CULTURE, ROUTINE-ABSCESS     Status: None   Collection Time    07/30/13  3:36 PM      Result Value Ref Range Status   Specimen Description DRAINAGE   Final   Special Requests Normal   Final   Gram Stain     Final   Value: ABUNDANT WBC PRESENT,BOTH PMN AND MONONUCLEAR     NO SQUAMOUS EPITHELIAL CELLS SEEN     FEW GRAM NEGATIVE RODS     Performed at [facility]   Culture     Final   Value: MODERATE PROTEUS MIRABILIS     Performed at [facility]   Report Status 08/02/2013 FINAL   Final   Organism ID, Bacteria PROTEUS MIRABILIS   Final  ANAEROBIC CULTURE     Status:  None   Collection Time    07/30/13  3:36 PM      Result Value Ref Range Status   Specimen Description ABSCESS   Final   Special Requests Normal   Final   Gram Stain     Final   Value: ABUNDANT WBC PRESENT,BOTH PMN AND MONONUCLEAR     NO SQUAMOUS EPITHELIAL CELLS SEEN     FEW GRAM NEGATIVE RODS     Performed at [facility]   Culture     Final   Value: NO ANAEROBES ISOLATED     Performed at [facility]   Report Status 08/04/2013 FINAL   Final  CLOSTRIDIUM DIFFICILE BY PCR     Status: None   Collection Time    08/04/13 11:46 AM      Result Value Ref Range Status   C difficile by pcr NEGATIVE  NEGATIVE Final   Comment: Performed at [facility]    Studies/Results: No results found.  Assessment/Plan: She seems to be doing well status post nephrectomy for XGP. Her drain output is moderate. I will leave the drain in for now. She has not had any nausea or vomiting but her bowel sounds are hypoactive so I will maintain her on clear liquids for another 24 hours and then advance diet if appropriate. Her labs looked good with a slightly low sodium. She was on D5 LR so I have switched her to D5 normal saline and will slow the rate slightly. I will recheck labs in the a.m.  Switch IV fluid  Check BMP in a.m.  Continue clear liquid diet for another 24 hours.   LOS: 1 day   , C 10/06/2013, 7:04 AM

## 2013-10-06 NOTE — Op Note (Signed)
Jacqueline Norman, Jacqueline Norman NO.:  [id number]  MEDICAL RECORD NO.:  [id number]  LOCATION:  1415                         FACILITY:  [facility]  PHYSICIAN:  Sebastian Ache, MD     DATE OF BIRTH:  [birth year]  DATE OF PROCEDURE:  10/05/2013 DATE OF DISCHARGE:                              OPERATIVE REPORT   DIAGNOSES:  Right xanthogranulomatous pyelonephritis, atrophic kidney, renal stones, and perinephric abscess.  PROCEDURE: 1. Robotic-assisted laparoscopic right radical nephrectomy.  ASSISTANT: 1. Pecola Leisure, PA. 2. Harlene Salts, MD.  ESTIMATED BLOOD LOSS:  200 mL.  FINDINGS: 1. Densely inflamed right kidney as expected. 2. Posterior surface area of small abscess and gross pus. 3. Single artery single vein right renovascular anatomy.  DRAIN: 1. Jackson-Pratt drain to bulb suction. 2. Foley catheter to straight drain.  INDICATION:  Jacqueline Norman is an unfortunate 43 year old lady with history of advanced multiple sclerosis who is wheelchair bound.  She was found to have a right hydronephrotic infected kidney with multiple stones consistent with xanthogranulomatous pyelonephritis several months ago. She was temporizing percutaneous drain and long-term culture specific antibiotics.  She has since cleared her clinical infectious parameters and now presents for right radical nephrectomy with minimally invasive assistance.  Notably, her contralateral kidney is grossly normal and kidney function has been excellent.  Informed consent was obtained and placed in medical record.  PROCEDURE IN DETAIL:  The patient being Jacqueline Norman was verified. Procedure being right radical nephrectomy robotic was confirmed. Procedure was carried out.  Time-out was performed.  Intravenous antibiotics were administered.  General endotracheal anesthesia was introduced.  Foley catheter was placed per urethra to straight drain. Sequential compression devices were applied.  The patient  was very carefully positioned into a right side up full flank position and flank in 15 degrees stable flexion, superior mild elevator bean bag. Sequential compression devices with bottom leg bent up, leg straight. Her dependent arm due to contractures was very carefully padded and taped into place.  She was further fashioned on the operative table using 3-inch tape over foam padding.  Sterile field was created by completely prepping the patient's entire right flank and abdomen using chlorhexidine gluconate.  Next, a high-flow low pressure pneumoperitoneum was obtained using Veress technique and the right lower quadrant having passed the aspiration and drop test.  Next, 12-mm robotic camera port was placed in position approximately 4 fingerbreadths superior lateral to the umbilicus.  Laparoscopic examination of the peritoneal cavity revealed no significant adhesions and no visceral injury.  There was obviously a massive kidney in the retroperitoneum with some displacement of the abdominal structures as anticipated.  Additional ports were then placed as follows; right subxiphoid 5 mm liver retraction port through which a self-locking grasper was very carefully positioned along the lower edge of liver, this was placed on gentle superior traction away from the superior surface of the kidney.  An 8 mm subcostal robotic port 8 mm right far lateral robotic port approximately 1 handbreadth superior and medial to the anterior iliac spine, right paramedian inferior robotic port approximately and 1-1/2 handbreadth superior to the pubic symphysis, and two 12-mm assistant ports in the midline one 2 fingerbreadths above  and one 2 fingerbreadths below the camera port.  Robot was docked and passed through electronic checks.  Next, attention was directed at the development of the retroperitoneum.  Incision was made lateral to the ascending colon from the area of the hepatic flexure towards the area  of the cecum and the colon was carefully mobilized medially.  Duodenum was encountered and was kocherized medially as well.  Lower pole of the kidney was identified, placed on gentle lateral traction, and dissection proceeded medial to this.  Ureter was encountered as was the gonadal vein.  Dissection proceeded within this triangle towards the area of the gross renal hilum.  There was very dense inflammatory tissue along the psoas muscle also involving the area of the gonadal.  The gonadal vein was then proximally doubly clipped distal and ligated near to its insertion in the inferior vena cava and was controlled distally along with the ureter using cold clipping.  Renal hilum was encountered and consisted of single artery, single vein right renovascular anatomy. Very carefully, the artery was controlled using an extra large clip proximal followed by vascular stapler distal and the vein controlled using vascular stapler.  Dissection then proceeded in this plane superiorly just lateral to the adrenal gland completely developing the posterior plane of the kidney.  Superior attachments were then taken down as were lateral attachments.  All of these planes were gross densely adherent as expected given the diagnosis.  Posteriorly, there was a formed abscess pocket in continuity to the psoas muscle as expected, this was very carefully incised and drained of all gross purulence.  The right kidney was then placed into an extra large EndoCatch bag for later retrieval.  All sponge and needle counts were correct.  Hemostasis appeared excellent.  Given the finding of drained abscess, it was felt that a surgical drain was warranted, as such a Jackson-Pratt type drain was brought through the lateral most robotic port site in the area of retroperitoneum.  Suture was placed in the skin using Prolene.  Robot was then undocked.  Specimens retrieved by connecting the previous 2 mm assistant port in the  midline and removing the right radical nephrectomy specimens in its entirety and setting aside for permanent pathology.  The previous right paramedian camera port site was closed at the level of fascia using figure-of-eight 2-0 Vicryl.  The extraction port site was closed by first filling omentum over the area and then closed with figure-of-eight PDS at the level of the fascia x8.  Scarpa was reapproximated using Vicryl.  All incisions were infiltrated with dilute lipolyzed Marcaine and closed at the level of the skin using subcuticular Monocryl and Dermabond.  Procedure was then terminated.  The patient tolerated the procedure well.  There were no immediate periprocedural complications, and the patient was taken to the postanesthesia care unit in a stable condition.          ______________________________ Sebastian Ache, MD     TM/MEDQ  D:  10/05/2013  T:  10/06/2013  Job:  [id number]

## 2013-10-07 LAB — BASIC METABOLIC PANEL
Anion gap: 10 (ref 5–15)
BUN: 3 mg/dL — ABNORMAL LOW (ref 6–23)
CO2: 24 mEq/L (ref 19–32)
Calcium: 8.6 mg/dL (ref 8.4–10.5)
Chloride: 102 mEq/L (ref 96–112)
Creatinine, Ser: 0.75 mg/dL (ref 0.50–1.10)
GFR calc Af Amer: >90 mL/min (ref >90)
GFR calc non Af Amer: >90 mL/min (ref >90)
Glucose, Bld: 110 mg/dL — ABNORMAL HIGH (ref 70–99)
Potassium: 3.3 mEq/L — ABNORMAL LOW (ref 3.7–5.3)
Sodium: 136 mEq/L — ABNORMAL LOW (ref 137–147)

## 2013-10-07 LAB — CBC
HCT: 31.4 % — ABNORMAL LOW (ref 36.0–46.0)
Hemoglobin: 9.7 g/dL — ABNORMAL LOW (ref 12.0–15.0)
MCH: 24.8 pg — ABNORMAL LOW (ref 26.0–34.0)
MCHC: 30.9 g/dL (ref 30.0–36.0)
MCV: 80.3 fL (ref 78.0–100.0)
Platelets: 220 10*3/uL (ref 150–400)
RBC: 3.91 MIL/uL (ref 3.87–5.11)
RDW: 19.5 % — ABNORMAL HIGH (ref 11.5–15.5)
WBC: 8.2 10*3/uL (ref 4.0–10.5)

## 2013-10-07 NOTE — Progress Notes (Signed)
2 Days Post-Op Subjective: Patient reports no new complaints. She remains sore does not report any increase in her pain. She is tolerating a regular diet. She has not had a bowel movement but has had flatus.  Objective: Vital signs in last 24 hours: Temp:  [98 F (36.7 C)-99.2 F (37.3 C)] 99.2 F (37.3 C) (08/30 0616) Pulse Rate:  [104-119] 111 (08/30 0616) Resp:  [16] 16 (08/30 0616) BP: (104-115)/(70-79) 104/75 mmHg (08/30 0616) SpO2:  [97 %-100 %] 97 % (08/30 0616)  Intake/Output from previous day: 08/29 0701 - 08/30 0700 In: 2885 [P.O.:480; I.V.:2255; IV Piggyback:150] Out: 3415 [Urine:3300; Drains:115] Intake/Output this shift:    Physical Exam:  Her abdomen is soft and nontender with positive bowel sounds.  Lab Results:  Recent Labs  10/05/13 0938 10/05/13 1715 10/06/13 0418  HGB 10.9* 10.2* 10.2*  HCT 34.8* 32.9* 33.6*   BMET  Recent Labs  10/05/13 0938 10/06/13 0418  NA 136* 134*  K 3.6* 3.8  CL 101 98  CO2 24 23  GLUCOSE 104* 125*  BUN 9 5*  CREATININE 0.69 0.67  CALCIUM 9.4 8.6   No results found for this basename: LABPT, INR,  in the last 72 hours No results found for this basename: LABURIN,  in the last 72 hours Results for orders placed during the hospital encounter of 07/29/13  URINE CULTURE     Status: None   Collection Time    07/29/13  4:54 PM      Result Value Ref Range Status   Specimen Description URINE, RANDOM   Final   Special Requests NONE   Final   Culture  Setup Time     Final   Value: 07/30/2013 11:10     Performed at [facility] Count     Final   Value: >=100,000 COLONIES/ML     Performed at [facility]   Culture     Final   Value: Multiple bacterial morphotypes present, none predominant. Suggest appropriate recollection if clinically indicated.     Performed at [facility]   Report Status 07/31/2013 FINAL   Final  CULTURE, BLOOD (ROUTINE X 2)     Status: None   Collection Time   07/29/13  9:03 PM      Result Value Ref Range Status   Specimen Description BLOOD RIGHT HAND   Final   Special Requests BOTTLES DRAWN AEROBIC AND ANAEROBIC 5CC   Final   Culture  Setup Time     Final   Value: 07/30/2013 04:34     Performed at [facility]   Culture     Final   Value: NO GROWTH 5 DAYS     Performed at [facility]   Report Status 08/05/2013 FINAL   Final  CULTURE, BLOOD (ROUTINE X 2)     Status: None   Collection Time    07/29/13  9:04 PM      Result Value Ref Range Status   Specimen Description BLOOD RIGHT ARM   Final   Special Requests BOTTLES DRAWN AEROBIC ONLY 3CC   Final   Culture  Setup Time     Final   Value: 07/30/2013 04:34     Performed at [facility]   Culture     Final   Value: NO GROWTH 5 DAYS     Performed at [facility]   Report Status 08/05/2013 FINAL   Final  MRSA PCR SCREENING  Status: None   Collection Time    07/29/13 10:53 PM      Result Value Ref Range Status   MRSA by PCR NEGATIVE  NEGATIVE Final   Comment:            The GeneXpert MRSA Assay (FDA     approved for NASAL specimens     only), is one component of a     comprehensive MRSA colonization     surveillance program. It is not     intended to diagnose MRSA     infection nor to guide or     monitor treatment for     MRSA infections.  CULTURE, ROUTINE-ABSCESS     Status: None   Collection Time    07/30/13  3:36 PM      Result Value Ref Range Status   Specimen Description DRAINAGE   Final   Special Requests Normal   Final   Gram Stain     Final   Value: ABUNDANT WBC PRESENT,BOTH PMN AND MONONUCLEAR     NO SQUAMOUS EPITHELIAL CELLS SEEN     FEW GRAM NEGATIVE RODS     Performed at [facility]   Culture     Final   Value: MODERATE PROTEUS MIRABILIS     Performed at [facility]   Report Status 08/02/2013 FINAL   Final   Organism ID, Bacteria PROTEUS MIRABILIS   Final  ANAEROBIC CULTURE     Status: None   Collection Time     07/30/13  3:36 PM      Result Value Ref Range Status   Specimen Description ABSCESS   Final   Special Requests Normal   Final   Gram Stain     Final   Value: ABUNDANT WBC PRESENT,BOTH PMN AND MONONUCLEAR     NO SQUAMOUS EPITHELIAL CELLS SEEN     FEW GRAM NEGATIVE RODS     Performed at [facility]   Culture     Final   Value: NO ANAEROBES ISOLATED     Performed at [facility]   Report Status 08/04/2013 FINAL   Final  CLOSTRIDIUM DIFFICILE BY PCR     Status: None   Collection Time    08/04/13 11:46 AM      Result Value Ref Range Status   C difficile by pcr NEGATIVE  NEGATIVE Final   Comment: Performed at [facility]    Studies/Results: No results found.  Assessment/Plan: She seems to be progressing appropriately. She is tolerating her diet which will be advanced. Her sodium was a little oh yesterday so I will recheck that today. She remains afebrile and her H&H was stable yesterday. I will check a CBC today.   Check CBC and BMP  Advance diet to regular  Hep-Lock IV   LOS: 2 days   , C 10/07/2013, 7:39 AM

## 2013-10-08 ENCOUNTER — Encounter (HOSPITAL_COMMUNITY): Payer: Self-pay | Admitting: Urology

## 2013-10-08 MED ORDER — CIPROFLOXACIN HCL 500 MG PO TABS: 500.0000 mg | ORAL_TABLET | Freq: Two times a day (BID) | ORAL | Status: DC

## 2013-10-08 MED ORDER — OXYCODONE-ACETAMINOPHEN 5-325 MG PO TABS: 1.0000 | ORAL_TABLET | Freq: Four times a day (QID) | ORAL | Status: DC | PRN

## 2013-10-08 NOTE — Progress Notes (Signed)
CSW consulted for transportation needs. Patient will need non-emergency ambulance transport home. CSW confirmed home address with patient's family at bedside. Family called [facility] called for transport.   No other CSW needs identified - CSW signing off.   Lincoln Maxin, LCSW [facility] Clinical Social Worker cell #: [phone]

## 2013-10-08 NOTE — Care Management Note (Signed)
    Page 1 of 1   10/08/2013     12:34:03 PM CARE MANAGEMENT NOTE 10/08/2013  Patient:  Jacqueline Norman, Jacqueline Norman   Account Number:  [id number]  Date Initiated:  10/08/2013  Documentation initiated by:  [facility]  Subjective/Objective Assessment:   43 Y/O F ADMITTED W/XANTHOGRANULOMATOUS PYELONEPHRITIS.     Action/Plan:   FROM HOME W/MOTHER.HAS PRIVATE DUTY CARE-MAXIM.M-F.   Anticipated DC Date:  10/08/2013   Anticipated DC Plan:  [place]  CM consult      Choice offered to / List presented to:             Status of service:  Completed, signed off Medicare Important Message given?  YES (If response is "NO", the following Medicare IM given date fields will be blank) Date Medicare IM given:  10/08/2013 Medicare IM given by:  [facility] Date Additional Medicare IM given:   Additional Medicare IM given by:    Discharge Disposition:  HOME/SELF CARE  Per UR Regulation:  Reviewed for med. necessity/level of care/duration of stay  If discussed at [place] of Stay Meetings, dates discussed:    Comments:  10/08/13   RN,BSN NCM [address].TC [facility] REP KRISTEN.HAS USED [facility] HHRN IN PAST FROM 08/06/13-09/18/13 FOR DRAIN CARE,& DSG CHANGES-GOALS WERE MET.NO NEED FOR HHRN THIS ADMISSION-DRAIN D/C,NO DSG CHANGES NEEDED.NO HHRN ORDER PLACED OR NEEDED.NURSE UPDATED.NO FURTHER D/C NEEDS.AMBULANCE TRANSP HOME.

## 2013-10-08 NOTE — Progress Notes (Signed)
3 Days Post-Op Subjective: Some abdominal soreness but controlled with PO meds. Passing gas, no BM. Tolerating regular food. Has a cough but able to get up phlegm. No fevers, chills, nausea/vomiting. Overall feels well and without complaints.  Objective: Vital signs in last 24 hours: Temp:  [97.3 F (36.3 C)-98.5 F (36.9 C)] 98.5 F (36.9 C) (08/31 0512) Pulse Rate:  [112-118] 112 (08/31 0512) Resp:  [16] 16 (08/31 0512) BP: (98-119)/(60-71) 100/64 mmHg (08/31 0512) SpO2:  [94 %-97 %] 94 % (08/31 0512)  Intake/Output from previous day: 08/30 0701 - 08/31 0700 In: 3413.3 [P.O.:900; I.V.:2463.3; IV Piggyback:50] Out: 2763 [Urine:2650; Drains:113] Intake/Output this shift:    Physical Exam:  General: Alert and oriented CV: RRR Lungs: Clear Abdomen: Soft, ND Incisions: c/d/i Drain: serosanguinous,  Ext: NT, No erythema  Lab Results:  Recent Labs  10/05/13 1715 10/06/13 0418 10/07/13 0816  HGB 10.2* 10.2* 9.7*  HCT 32.9* 33.6* 31.4*   BMET  Recent Labs  10/06/13 0418 10/07/13 0816  NA 134* 136*  K 3.8 3.3*  CL 98 102  CO2 23 24  GLUCOSE 125* 110*  BUN 5* 3*  CREATININE 0.67 0.75  CALCIUM 8.6 8.6     Studies/Results: No results found.  Assessment/Plan: POD#3 from robotic nephrectomy of right XGP kidney. Doing well, essentially at her baseline.  -- pain controlled on PO pain meds -- passing gas consistently, tolerating PO intake, medlock IV -- d/c foley -- continue on PO abx post-op -- possible d/c later today   LOS: 3 days   ,  C 10/08/2013, 7:19 AM

## 2013-10-08 NOTE — Discharge Summary (Signed)
Physician Discharge Summary  Patient ID: Jacqueline Norman MRN: [id number] DOB/AGE: 08/20/1970 43 y.o.  Admit date: 10/05/2013 Discharge date: 10/08/2013  Admission Diagnoses:  1 - Right Xanthogranulomatous Pyelonephritis with Psoas Abscess -  Discharge Diagnoses:  Active Problems:   Xanthogranulomatous pyelonephritis   Discharged Condition: fair  Hospital Course:   1 - Right Xanthogranulomatous Pyelonephritis with Psoas Abscess - s/p right robotic radical nephrectomy 10/05/13 for large right psoas abcess and XGP kidney without acute complications. She was observed for several days post-op with surgical drain in place and kept on CX-specific ABX to verify no worrisome post-op infectious issues which did not occur. By 8/31, the day of discharge, pt at baseline functional status (bedbound), pain controlled, afebrile, and felt to be suitable for discharge. JP and foley removed before DC home.     Consults: None  Significant Diagnostic Studies: labs: Hgb (>9 at discharge), surgical pathology - pending  Treatments: surgery: right robotic radical nephrectomy 10/05/13  Discharge Exam: Blood pressure 100/64, pulse 112, temperature 98.5 F (36.9 C), temperature source Oral, resp. rate 16, height _0  (1.626 m), weight 90.719 kg (200 lb), last menstrual period 09/24/2013, SpO2 94.00%. General appearance: alert, cooperative, appears stated age and mother at bedside Head: Normocephalic, without obvious abnormality, atraumatic Nose: Nares normal. Septum midline. Mucosa normal. No drainage or sinus tenderness. Throat: lips, mucosa, and tongue normal; teeth and gums normal Neck: supple, symmetrical, trachea midline Back: symmetric, no curvature. ROM normal. No CVA tenderness. Resp: non-labored on room air Cardio: Nl rate GI: soft, non-tender; bowel sounds normal; no masses,  no organomegaly Pelvic: external genitalia normal and foley c/d/i with yellow urine Extremities: stable UE  contractures Skin: Skin color, texture, turgor normal. No rashes or lesions Lymph nodes: Cervical, supraclavicular, and axillary nodes normal. Neurologic: Grossly normal Incision/Wound: Recent incision sites c/d/i. No hernias. JP with scant serous output only.   Disposition: 01-Home or Self Care     Medication List         baclofen 10 MG tablet  Commonly known as:  LIORESAL  Take 10 mg by mouth 2 (two) times daily.     benztropine 1 MG tablet  Commonly known as:  COGENTIN  Take 1 mg by mouth 2 (two) times daily.     cholecalciferol 1000 UNITS tablet  Commonly known as:  VITAMIN D  Take 1,000 Units by mouth every morning.     docusate sodium 100 MG capsule  Commonly known as:  COLACE  Take 100 mg by mouth daily as needed for mild constipation.     ferrous sulfate 325 (65 FE) MG tablet  Take 1 tablet (325 mg total) by mouth 3 (three) times daily with meals.     Interferon Beta-1b 0.3 MG Kit injection  Commonly known as:  BETASERON/EXTAVIA  Inject 0.3 mg into the skin every other day.     levothyroxine 50 MCG tablet  Commonly known as:  SYNTHROID, LEVOTHROID  Take 50 mcg by mouth daily before breakfast.     omega-3 acid ethyl esters 1 G capsule  Commonly known as:  LOVAZA  Take 1 g by mouth 2 (two) times daily.     potassium chloride SA 20 MEQ tablet  Commonly known as:  K-DUR,KLOR-CON  Take 20 mEq by mouth every morning.     simvastatin 5 MG tablet  Commonly known as:  ZOCOR  Take 5 mg by mouth at bedtime.           Follow-up Information   Follow  up with Alexis Frock, MD On 10/23/2013. (at 10:00)    Specialty:  Urology   Contact information:   [address] [place] [address] [phone]       Signed: Alexis Frock 10/08/2013, 7:57 AM

## 2013-10-09 LAB — TYPE AND SCREEN
ABO/RH(D): O POS
ANTIBODY SCREEN: NEGATIVE
UNIT DIVISION: 0
Unit division: 0
Unit division: 0
Unit division: 0

## 2013-10-10 NOTE — Progress Notes (Signed)
I have seen and examined the patient and documented findings in separate discharge summary with same date.

## 2014-08-04 ENCOUNTER — Encounter (HOSPITAL_COMMUNITY): Payer: Self-pay | Admitting: Oncology

## 2014-08-04 ENCOUNTER — Emergency Department (HOSPITAL_COMMUNITY): Payer: Medicare Other

## 2014-08-04 ENCOUNTER — Inpatient Hospital Stay (HOSPITAL_COMMUNITY)
Admission: EM | Admit: 2014-08-04 | Discharge: 2014-08-11 | DRG: 853 | Disposition: A | Discharge status: Home Health | Payer: Medicare Other | Attending: Internal Medicine | Admitting: Internal Medicine

## 2014-08-04 DIAGNOSIS — Z905 Acquired absence of kidney: Secondary | ICD-10-CM | POA: Diagnosis present

## 2014-08-04 DIAGNOSIS — N202 Calculus of kidney with calculus of ureter: Secondary | ICD-10-CM | POA: Diagnosis present

## 2014-08-04 DIAGNOSIS — G9340 Encephalopathy, unspecified: Secondary | ICD-10-CM | POA: Diagnosis present

## 2014-08-04 DIAGNOSIS — Z87891 Personal history of nicotine dependence: Secondary | ICD-10-CM

## 2014-08-04 DIAGNOSIS — B957 Other staphylococcus as the cause of diseases classified elsewhere: Secondary | ICD-10-CM | POA: Diagnosis present

## 2014-08-04 DIAGNOSIS — E876 Hypokalemia: Secondary | ICD-10-CM | POA: Diagnosis present

## 2014-08-04 DIAGNOSIS — R6520 Severe sepsis without septic shock: Secondary | ICD-10-CM | POA: Diagnosis present

## 2014-08-04 DIAGNOSIS — G35D Multiple sclerosis, unspecified: Secondary | ICD-10-CM | POA: Diagnosis present

## 2014-08-04 DIAGNOSIS — E039 Hypothyroidism, unspecified: Secondary | ICD-10-CM | POA: Diagnosis present

## 2014-08-04 DIAGNOSIS — G8250 Quadriplegia, unspecified: Secondary | ICD-10-CM | POA: Diagnosis present

## 2014-08-04 DIAGNOSIS — N12 Tubulo-interstitial nephritis, not specified as acute or chronic: Secondary | ICD-10-CM

## 2014-08-04 DIAGNOSIS — N179 Acute kidney failure, unspecified: Secondary | ICD-10-CM | POA: Diagnosis present

## 2014-08-04 DIAGNOSIS — A411 Sepsis due to other specified staphylococcus: Secondary | ICD-10-CM | POA: Diagnosis not present

## 2014-08-04 DIAGNOSIS — R4182 Altered mental status, unspecified: Secondary | ICD-10-CM | POA: Diagnosis not present

## 2014-08-04 DIAGNOSIS — E785 Hyperlipidemia, unspecified: Secondary | ICD-10-CM | POA: Diagnosis present

## 2014-08-04 DIAGNOSIS — N200 Calculus of kidney: Secondary | ICD-10-CM | POA: Diagnosis present

## 2014-08-04 DIAGNOSIS — D649 Anemia, unspecified: Secondary | ICD-10-CM | POA: Diagnosis present

## 2014-08-04 DIAGNOSIS — B9620 Unspecified Escherichia coli [E. coli] as the cause of diseases classified elsewhere: Secondary | ICD-10-CM | POA: Diagnosis present

## 2014-08-04 DIAGNOSIS — G35 Multiple sclerosis: Secondary | ICD-10-CM | POA: Diagnosis present

## 2014-08-04 DIAGNOSIS — R7881 Bacteremia: Secondary | ICD-10-CM | POA: Diagnosis present

## 2014-08-04 DIAGNOSIS — N1330 Unspecified hydronephrosis: Secondary | ICD-10-CM | POA: Diagnosis present

## 2014-08-04 DIAGNOSIS — N132 Hydronephrosis with renal and ureteral calculous obstruction: Secondary | ICD-10-CM | POA: Diagnosis present

## 2014-08-04 DIAGNOSIS — N390 Urinary tract infection, site not specified: Secondary | ICD-10-CM | POA: Diagnosis present

## 2014-08-04 DIAGNOSIS — Z6832 Body mass index (BMI) 32.0-32.9, adult: Secondary | ICD-10-CM

## 2014-08-04 DIAGNOSIS — A419 Sepsis, unspecified organism: Secondary | ICD-10-CM | POA: Diagnosis present

## 2014-08-04 DIAGNOSIS — N151 Renal and perinephric abscess: Secondary | ICD-10-CM | POA: Diagnosis present

## 2014-08-04 DIAGNOSIS — R400 Somnolence: Secondary | ICD-10-CM

## 2014-08-04 DIAGNOSIS — Z7401 Bed confinement status: Secondary | ICD-10-CM

## 2014-08-04 DIAGNOSIS — Z79899 Other long term (current) drug therapy: Secondary | ICD-10-CM

## 2014-08-04 LAB — I-STAT CG4 LACTIC ACID, ED: Lactic Acid, Venous: 1.19 mmol/L (ref 0.5–2.0)

## 2014-08-04 LAB — CBG MONITORING, ED: GLUCOSE-CAPILLARY: 124 mg/dL — AB (ref 65–99)

## 2014-08-04 MED ORDER — SODIUM CHLORIDE 0.9 % IV BOLUS (SEPSIS)
1000.0000 mL | INTRAVENOUS | Status: AC
  Administered 2014-08-05 (×2): 1000 mL via INTRAVENOUS

## 2014-08-04 NOTE — ED Notes (Signed)
Per EMS pt's family noticed that pt was not responding properly at approximately 1900.  Pt has hx of MS.  Pt will open her eyes however does not respond verbally.  Per pt's family pt baseline is A&O x 4 and verbal.

## 2014-08-04 NOTE — ED Provider Notes (Signed)
MSE was initiated and I personally evaluated the patient and placed orders (if any) at  11:32 PM on August 04, 2014.  The patient appears stable so that the remainder of the MSE may be completed by another provider. Called to bedside due to dec LOC. On PE, Pt mildly tachycardic, not verbally responsive, but will open eyes and withdraws to pain, appears to be protecting airway.   Toy Cookey, MD 08/05/14 1250

## 2014-08-04 NOTE — ED Notes (Signed)
Writer had two unsuccessful attempt for blood cultures 

## 2014-08-04 NOTE — ED Notes (Signed)
Bed: RESA Expected date:  Expected time:  Means of arrival:  Comments: EMS - MS pt, altered LOC

## 2014-08-05 ENCOUNTER — Emergency Department (HOSPITAL_COMMUNITY): Payer: Medicare Other

## 2014-08-05 ENCOUNTER — Encounter (HOSPITAL_COMMUNITY): Payer: Self-pay | Admitting: Internal Medicine

## 2014-08-05 ENCOUNTER — Inpatient Hospital Stay (HOSPITAL_COMMUNITY)
Admit: 2014-08-05 | Discharge: 2014-08-05 | Disposition: A | Discharge status: Home / Self Care | Payer: Medicare Other | Attending: Internal Medicine | Admitting: Internal Medicine

## 2014-08-05 ENCOUNTER — Inpatient Hospital Stay (HOSPITAL_COMMUNITY): Payer: Medicare Other

## 2014-08-05 DIAGNOSIS — A4151 Sepsis due to Escherichia coli [E. coli]: Secondary | ICD-10-CM | POA: Diagnosis not present

## 2014-08-05 DIAGNOSIS — R4182 Altered mental status, unspecified: Secondary | ICD-10-CM | POA: Diagnosis present

## 2014-08-05 DIAGNOSIS — R7881 Bacteremia: Secondary | ICD-10-CM | POA: Diagnosis not present

## 2014-08-05 DIAGNOSIS — N12 Tubulo-interstitial nephritis, not specified as acute or chronic: Secondary | ICD-10-CM | POA: Diagnosis not present

## 2014-08-05 DIAGNOSIS — Z6832 Body mass index (BMI) 32.0-32.9, adult: Secondary | ICD-10-CM | POA: Diagnosis not present

## 2014-08-05 DIAGNOSIS — G35 Multiple sclerosis: Secondary | ICD-10-CM | POA: Diagnosis not present

## 2014-08-05 DIAGNOSIS — R6520 Severe sepsis without septic shock: Secondary | ICD-10-CM

## 2014-08-05 DIAGNOSIS — B957 Other staphylococcus as the cause of diseases classified elsewhere: Secondary | ICD-10-CM | POA: Diagnosis not present

## 2014-08-05 DIAGNOSIS — Z7401 Bed confinement status: Secondary | ICD-10-CM | POA: Diagnosis not present

## 2014-08-05 DIAGNOSIS — A411 Sepsis due to other specified staphylococcus: Secondary | ICD-10-CM | POA: Diagnosis present

## 2014-08-05 DIAGNOSIS — A419 Sepsis, unspecified organism: Secondary | ICD-10-CM | POA: Diagnosis not present

## 2014-08-05 DIAGNOSIS — N202 Calculus of kidney with calculus of ureter: Secondary | ICD-10-CM | POA: Diagnosis present

## 2014-08-05 DIAGNOSIS — N179 Acute kidney failure, unspecified: Secondary | ICD-10-CM | POA: Diagnosis not present

## 2014-08-05 DIAGNOSIS — Z905 Acquired absence of kidney: Secondary | ICD-10-CM | POA: Diagnosis present

## 2014-08-05 DIAGNOSIS — N132 Hydronephrosis with renal and ureteral calculous obstruction: Secondary | ICD-10-CM | POA: Diagnosis present

## 2014-08-05 DIAGNOSIS — N390 Urinary tract infection, site not specified: Secondary | ICD-10-CM | POA: Diagnosis present

## 2014-08-05 DIAGNOSIS — E785 Hyperlipidemia, unspecified: Secondary | ICD-10-CM | POA: Diagnosis present

## 2014-08-05 DIAGNOSIS — N1330 Unspecified hydronephrosis: Secondary | ICD-10-CM | POA: Diagnosis present

## 2014-08-05 DIAGNOSIS — Z87891 Personal history of nicotine dependence: Secondary | ICD-10-CM | POA: Diagnosis not present

## 2014-08-05 DIAGNOSIS — G9340 Encephalopathy, unspecified: Secondary | ICD-10-CM | POA: Diagnosis not present

## 2014-08-05 DIAGNOSIS — N151 Renal and perinephric abscess: Secondary | ICD-10-CM | POA: Diagnosis present

## 2014-08-05 DIAGNOSIS — G8250 Quadriplegia, unspecified: Secondary | ICD-10-CM | POA: Diagnosis present

## 2014-08-05 DIAGNOSIS — E876 Hypokalemia: Secondary | ICD-10-CM | POA: Diagnosis not present

## 2014-08-05 DIAGNOSIS — E039 Hypothyroidism, unspecified: Secondary | ICD-10-CM | POA: Diagnosis present

## 2014-08-05 DIAGNOSIS — L0291 Cutaneous abscess, unspecified: Secondary | ICD-10-CM | POA: Diagnosis not present

## 2014-08-05 DIAGNOSIS — D649 Anemia, unspecified: Secondary | ICD-10-CM | POA: Diagnosis present

## 2014-08-05 DIAGNOSIS — Z79899 Other long term (current) drug therapy: Secondary | ICD-10-CM | POA: Diagnosis not present

## 2014-08-05 DIAGNOSIS — B9620 Unspecified Escherichia coli [E. coli] as the cause of diseases classified elsewhere: Secondary | ICD-10-CM | POA: Diagnosis present

## 2014-08-05 LAB — URINALYSIS, ROUTINE W REFLEX MICROSCOPIC
Bilirubin Urine: NEGATIVE
GLUCOSE, UA: NEGATIVE mg/dL
Ketones, ur: NEGATIVE mg/dL
Nitrite: NEGATIVE
Protein, ur: 100 mg/dL — AB
Specific Gravity, Urine: 1.01 (ref 1.005–1.030)
Urobilinogen, UA: 0.2 mg/dL (ref 0.0–1.0)
pH: 6 (ref 5.0–8.0)

## 2014-08-05 LAB — CBC WITH DIFFERENTIAL/PLATELET
BASOS PCT: 0 % (ref 0–1)
Basophils Absolute: 0 10*3/uL (ref 0.0–0.1)
Eosinophils Absolute: 0 10*3/uL (ref 0.0–0.7)
Eosinophils Relative: 0 % (ref 0–5)
HCT: 35.9 % — ABNORMAL LOW (ref 36.0–46.0)
Hemoglobin: 11.4 g/dL — ABNORMAL LOW (ref 12.0–15.0)
LYMPHS ABS: 1.2 10*3/uL (ref 0.7–4.0)
LYMPHS PCT: 13 % (ref 12–46)
MCH: 27.9 pg (ref 26.0–34.0)
MCHC: 31.8 g/dL (ref 30.0–36.0)
MCV: 88 fL (ref 78.0–100.0)
MONO ABS: 0.9 10*3/uL (ref 0.1–1.0)
MONOS PCT: 10 % (ref 3–12)
Neutro Abs: 7.4 10*3/uL (ref 1.7–7.7)
Neutrophils Relative %: 77 % (ref 43–77)
Platelets: 236 10*3/uL (ref 150–400)
RBC: 4.08 MIL/uL (ref 3.87–5.11)
RDW: 17.1 % — ABNORMAL HIGH (ref 11.5–15.5)
WBC: 9.6 10*3/uL (ref 4.0–10.5)

## 2014-08-05 LAB — CBC
HEMATOCRIT: 37.7 % (ref 36.0–46.0)
Hemoglobin: 12.4 g/dL (ref 12.0–15.0)
MCH: 28.4 pg (ref 26.0–34.0)
MCHC: 32.9 g/dL (ref 30.0–36.0)
MCV: 86.5 fL (ref 78.0–100.0)
PLATELETS: 257 10*3/uL (ref 150–400)
RBC: 4.36 MIL/uL (ref 3.87–5.11)
RDW: 16.7 % — ABNORMAL HIGH (ref 11.5–15.5)
WBC: 11.1 10*3/uL — AB (ref 4.0–10.5)

## 2014-08-05 LAB — URINE MICROSCOPIC-ADD ON

## 2014-08-05 LAB — BLOOD GAS, ARTERIAL
ACID-BASE DEFICIT: 5.5 mmol/L — AB (ref 0.0–2.0)
Bicarbonate: 17.9 mEq/L — ABNORMAL LOW (ref 20.0–24.0)
DRAWN BY: 11249
FIO2: 0.21 %
O2 SAT: 96.1 %
PCO2 ART: 31.5 mmHg — AB (ref 35.0–45.0)
Patient temperature: 100.8
TCO2: 16.3 mmol/L (ref 0–100)
pH, Arterial: 7.38 (ref 7.350–7.450)
pO2, Arterial: 89.3 mmHg (ref 80.0–100.0)

## 2014-08-05 LAB — COMPREHENSIVE METABOLIC PANEL
ALBUMIN: 3.5 g/dL (ref 3.5–5.0)
ALK PHOS: 54 U/L (ref 38–126)
ALT: 18 U/L (ref 14–54)
ALT: 15 U/L (ref 14–54)
ANION GAP: 10 (ref 5–15)
AST: 21 U/L (ref 15–41)
AST: 18 U/L (ref 15–41)
Albumin: 3 g/dL — ABNORMAL LOW (ref 3.5–5.0)
Alkaline Phosphatase: 49 U/L (ref 38–126)
Anion gap: 8 (ref 5–15)
BUN: 25 mg/dL — ABNORMAL HIGH (ref 6–20)
BUN: 27 mg/dL — AB (ref 6–20)
CALCIUM: 8.5 mg/dL — AB (ref 8.9–10.3)
CO2: 23 mmol/L (ref 22–32)
CO2: 19 mmol/L — ABNORMAL LOW (ref 22–32)
Calcium: 9.5 mg/dL (ref 8.9–10.3)
Chloride: 108 mmol/L (ref 101–111)
Chloride: 109 mmol/L (ref 101–111)
Creatinine, Ser: 3.71 mg/dL — ABNORMAL HIGH (ref 0.44–1.00)
Creatinine, Ser: 3.98 mg/dL — ABNORMAL HIGH (ref 0.44–1.00)
GFR calc Af Amer: 16 mL/min — ABNORMAL LOW (ref >60)
GFR calc Af Amer: 15 mL/min — ABNORMAL LOW (ref >60)
GFR calc non Af Amer: 14 mL/min — ABNORMAL LOW (ref >60)
GFR calc non Af Amer: 13 mL/min — ABNORMAL LOW (ref >60)
GLUCOSE: 133 mg/dL — AB (ref 65–99)
Glucose, Bld: 174 mg/dL — ABNORMAL HIGH (ref 65–99)
POTASSIUM: 4.7 mmol/L (ref 3.5–5.1)
Potassium: 4.5 mmol/L (ref 3.5–5.1)
SODIUM: 138 mmol/L (ref 135–145)
Sodium: 139 mmol/L (ref 135–145)
TOTAL PROTEIN: 7.2 g/dL (ref 6.5–8.1)
Total Bilirubin: 0.9 mg/dL (ref 0.3–1.2)
Total Bilirubin: 1.2 mg/dL (ref 0.3–1.2)
Total Protein: 7.8 g/dL (ref 6.5–8.1)

## 2014-08-05 LAB — LACTIC ACID, PLASMA: Lactic Acid, Venous: 1.1 mmol/L (ref 0.5–2.0)

## 2014-08-05 LAB — GLUCOSE, CAPILLARY
GLUCOSE-CAPILLARY: 98 mg/dL (ref 65–99)
GLUCOSE-CAPILLARY: 112 mg/dL — AB (ref 65–99)
Glucose-Capillary: 125 mg/dL — ABNORMAL HIGH (ref 65–99)
Glucose-Capillary: 116 mg/dL — ABNORMAL HIGH (ref 65–99)

## 2014-08-05 LAB — TYPE AND SCREEN
ABO/RH(D): O POS
Antibody Screen: NEGATIVE

## 2014-08-05 LAB — TSH: TSH: 1.98 u[IU]/mL (ref 0.350–4.500)

## 2014-08-05 LAB — CBG MONITORING, ED: Glucose-Capillary: 96 mg/dL (ref 65–99)

## 2014-08-05 LAB — APTT: APTT: 31 s (ref 24–37)

## 2014-08-05 LAB — PROCALCITONIN: PROCALCITONIN: 0.67 ng/mL

## 2014-08-05 LAB — MRSA PCR SCREENING: MRSA BY PCR: NEGATIVE

## 2014-08-05 LAB — PROTIME-INR
INR: 1.19 (ref 0.00–1.49)
Prothrombin Time: 15.3 seconds — ABNORMAL HIGH (ref 11.6–15.2)

## 2014-08-05 MED ORDER — ONDANSETRON HCL 4 MG/2ML IJ SOLN: 4.0000 mg | Freq: Four times a day (QID) | INTRAMUSCULAR | Status: DC | PRN

## 2014-08-05 MED ORDER — MIDAZOLAM HCL 2 MG/2ML IJ SOLN
INTRAMUSCULAR | Status: AC
  Filled 2014-08-05: qty 6

## 2014-08-05 MED ORDER — FENTANYL CITRATE (PF) 100 MCG/2ML IJ SOLN
INTRAMUSCULAR | Status: AC
  Filled 2014-08-05: qty 4

## 2014-08-05 MED ORDER — VANCOMYCIN HCL IN DEXTROSE 1-5 GM/200ML-% IV SOLN
1000.0000 mg | INTRAVENOUS | Status: DC
  Administered 2014-08-06 – 2014-08-07 (×2): 1000 mg via INTRAVENOUS
  Filled 2014-08-05 (×2): qty 200

## 2014-08-05 MED ORDER — VANCOMYCIN HCL IN DEXTROSE 1-5 GM/200ML-% IV SOLN
1000.0000 mg | Freq: Once | INTRAVENOUS | Status: AC
  Administered 2014-08-05: 1000 mg via INTRAVENOUS
  Filled 2014-08-05: qty 200

## 2014-08-05 MED ORDER — DEXTROSE 5 % IV SOLN
1.0000 g | Freq: Once | INTRAVENOUS | Status: AC
  Administered 2014-08-05: 1 g via INTRAVENOUS
  Filled 2014-08-05: qty 10

## 2014-08-05 MED ORDER — SODIUM CHLORIDE 0.9 % IV SOLN
INTRAVENOUS | Status: DC
  Administered 2014-08-05 – 2014-08-06 (×3): via INTRAVENOUS

## 2014-08-05 MED ORDER — SODIUM CHLORIDE 0.9 % IV SOLN: Freq: Once | INTRAVENOUS | Status: DC

## 2014-08-05 MED ORDER — LIDOCAINE HCL 1 % IJ SOLN
INTRAMUSCULAR | Status: AC
  Filled 2014-08-05: qty 20

## 2014-08-05 MED ORDER — ACETAMINOPHEN 650 MG RE SUPP: 650.0000 mg | Freq: Four times a day (QID) | RECTAL | Status: DC | PRN

## 2014-08-05 MED ORDER — SODIUM CHLORIDE 0.9 % IV SOLN: INTRAVENOUS | Status: DC

## 2014-08-05 MED ORDER — ONDANSETRON HCL 4 MG PO TABS: 4.0000 mg | ORAL_TABLET | Freq: Four times a day (QID) | ORAL | Status: DC | PRN

## 2014-08-05 MED ORDER — LEVOTHYROXINE SODIUM 100 MCG IV SOLR
25.0000 ug | Freq: Every day | INTRAVENOUS | Status: DC
  Administered 2014-08-05 – 2014-08-08 (×4): 25 ug via INTRAVENOUS
  Filled 2014-08-05 (×4): qty 5

## 2014-08-05 MED ORDER — IOHEXOL 300 MG/ML  SOLN
50.0000 mL | Freq: Once | INTRAMUSCULAR | Status: AC | PRN
  Administered 2014-08-05: 10 mL

## 2014-08-05 MED ORDER — CETYLPYRIDINIUM CHLORIDE 0.05 % MT LIQD
7.0000 mL | Freq: Two times a day (BID) | OROMUCOSAL | Status: DC
  Administered 2014-08-05 – 2014-08-11 (×11): 7 mL via OROMUCOSAL

## 2014-08-05 MED ORDER — MIDAZOLAM HCL 2 MG/2ML IJ SOLN
INTRAMUSCULAR | Status: AC | PRN
  Administered 2014-08-05 (×3): 0.5 mg via INTRAVENOUS

## 2014-08-05 MED ORDER — ACETAMINOPHEN 325 MG PO TABS
650.0000 mg | ORAL_TABLET | Freq: Four times a day (QID) | ORAL | Status: DC | PRN
Start: 2014-08-05 — End: 2014-08-11
  Administered 2014-08-07 – 2014-08-10 (×4): 650 mg via ORAL
  Filled 2014-08-05 (×4): qty 2

## 2014-08-05 MED ORDER — FENTANYL CITRATE (PF) 100 MCG/2ML IJ SOLN
INTRAMUSCULAR | Status: AC | PRN
  Administered 2014-08-05 (×2): 25 ug via INTRAVENOUS

## 2014-08-05 MED ORDER — PIPERACILLIN-TAZOBACTAM 3.375 G IVPB
3.3750 g | Freq: Three times a day (TID) | INTRAVENOUS | Status: DC
  Administered 2014-08-05 – 2014-08-09 (×13): 3.375 g via INTRAVENOUS
  Filled 2014-08-05 (×16): qty 50

## 2014-08-05 NOTE — H&P (Addendum)
[facility] History and Physical  Jacqueline Norman IRS:[id number] DOB: 06-Aug-1970 DOA: 08/04/2014  Referring physician: Dr.Nanavati. PCP: Reymundo Poll, MD  Specialists: None.  Chief Complaint: Altered mental status.  History obtained from patient's mother and ER physician and previous records.  HPI: Jacqueline Norman is a 44 y.o. female with history of multiple sclerosis was brought to the ER after patient was found to be increasingly somnolent and difficult to arouse. As per the family patient was doing fine until 7 PM last night when patient became somnolent and became increasingly difficult to arouse. Patient was brought to the ER and was found to be febrile and on the responding to deep sternal rub. As per the family patient was not started on any new medications or did not miss any of her medications. Patient did not have any nausea vomiting abdominal pain diarrhea chest pain shortness of breath productive cough prior to this episode. Patient was admitted last year for iliopsoas abscesses and right-sided xanthogranulomatous pyelonephritis requiring nephrectomy. Patient on my exam is very minimally responsive to sternal rub. Patient is usually bed bound secondary to multiple sclerosis. CT of the head is negative for anything acute. UA shows features concerning for UTI. Patient is admitted for acute encephalopathy secondary to sepsis from UTI.   Review of Systems: As presented in the history of presenting illness, rest negative.  Past Medical History  Diagnosis Date  . MS (multiple sclerosis)     BED BOUND -UP IN W/C USING HOYER LIFT- UNABLE TO MOVE ARMS AND LEGS - NOT ABLE TO FEED SELF- NO TROUBLE SWALLOWING - STATES SHE EATS WELL; INCONTINENT - WEARS DEPENDS  . Xanthogranulomatous pyelonephritis     RIGHT   . Perinephric abscess     HOSPITALIZED 07/29/13 TO 08/05/13.WITH SEPSIS  . Hypothyroidism   . Anemia    Past Surgical History  Procedure Laterality Date  . No past surgeries     . Robot assisted laparoscopic nephrectomy Right 10/05/2013    Procedure: ROBOTIC ASSISTED LAPAROSCOPIC RADICAL NEPHRECTOMY;  Surgeon: Alexis Frock, MD;  Location: [facility];  Service: Urology;  Laterality: Right;   Social History:  reports that she quit smoking about 17 years ago. She has never used smokeless tobacco. She reports that she does not drink alcohol or use illicit drugs. Where does patient live home. Can patient participate in ADLs? No.  No Known Allergies  Family History: History reviewed. No pertinent family history.    Prior to Admission medications   Medication Sig Start Date End Date Taking? Authorizing Provider  baclofen (LIORESAL) 10 MG tablet Take 10 mg by mouth 2 (two) times daily.   Yes Historical Provider, MD  benztropine (COGENTIN) 1 MG tablet Take 1 mg by mouth 2 (two) times daily.   Yes Historical Provider, MD  cholecalciferol (VITAMIN D) 1000 UNITS tablet Take 1,000 Units by mouth every morning.   Yes Historical Provider, MD  docusate sodium (COLACE) 100 MG capsule Take 100 mg by mouth daily as needed for mild constipation.   Yes Historical Provider, MD  ferrous sulfate 325 (65 FE) MG tablet Take 1 tablet (325 mg total) by mouth 3 (three) times daily with meals. 08/05/13  Yes Belkys A Regalado, MD  Interferon Beta-1b (BETASERON/EXTAVIA) 0.3 MG KIT injection Inject 0.3 mg into the skin every other day.   Yes Historical Provider, MD  levothyroxine (SYNTHROID, LEVOTHROID) 50 MCG tablet Take 50 mcg by mouth daily before breakfast.   Yes Historical Provider, MD  omega-3 acid ethyl esters (  LOVAZA) 1 G capsule Take 1 g by mouth 2 (two) times daily.    Yes Historical Provider, MD  potassium chloride SA (K-DUR,KLOR-CON) 20 MEQ tablet Take 20 mEq by mouth every morning.   Yes Historical Provider, MD  simvastatin (ZOCOR) 5 MG tablet Take 5 mg by mouth at bedtime.    Yes Historical Provider, MD  ciprofloxacin (CIPRO) 500 MG tablet Take 1 tablet (500 mg total) by mouth 2 (two)  times daily. X 7 days to prevent post-op infection 10/08/13   Alexis Frock, MD  oxyCODONE-acetaminophen (PERCOCET/ROXICET) 5-325 MG per tablet Take 1-2 tablets by mouth every 6 (six) hours as needed for moderate pain. Post-operatively Patient not taking: Reported on 08/05/2014 10/08/13   Alexis Frock, MD    Physical Exam: Filed Vitals:   08/04/14 2307 08/04/14 2330 08/05/14 0019 08/05/14 0100  BP:  131/89  139/75  Pulse:  102  93  Temp: 101.5 F (38.6 C)     TempSrc: Rectal     Resp:  14  14  Height:   5' 10"  (1.778 m)   Weight:   90.719 kg (200 lb)   SpO2:  97%  99%     General:  Moderately built and nourished.  Eyes: Anicteric no pallor.  ENT: No discharge from the ears eyes nose and mouth.  Neck: No mass felt.  Cardiovascular: S1-S2 heard.  Respiratory: No rhonchi or crepitations.  Abdomen: Soft nontender bowel sounds present.  Skin: No rash.  Musculoskeletal: No edema.  Psychiatric: Patient is encephalopathic.  Neurologic: Patient is encephalopathic and does not follow commands.  Labs on Admission:  Basic Metabolic Panel:  Recent Labs Lab 08/04/14 2327  NA 139  K 4.7  CL 108  CO2 23  GLUCOSE 133*  BUN 25*  CREATININE 3.71*  CALCIUM 9.5   Liver Function Tests:  Recent Labs Lab 08/04/14 2327  AST 21  ALT 18  ALKPHOS 54  BILITOT 0.9  PROT 7.8  ALBUMIN 3.5   No results for input(s): LIPASE, AMYLASE in the last 168 hours. No results for input(s): AMMONIA in the last 168 hours. CBC:  Recent Labs Lab 08/04/14 2327  WBC 11.1*  HGB 12.4  HCT 37.7  MCV 86.5  PLT 257   Cardiac Enzymes: No results for input(s): CKTOTAL, CKMB, CKMBINDEX, TROPONINI in the last 168 hours.  BNP (last 3 results) No results for input(s): BNP in the last 8760 hours.  ProBNP (last 3 results) No results for input(s): PROBNP in the last 8760 hours.  CBG:  Recent Labs Lab 08/04/14 2333 08/05/14 0232  GLUCAP 124* 96    Radiological Exams on  Admission: Ct Head Wo Contrast  08/05/2014   CLINICAL DATA:  Altered mental status. History of multiple sclerosis. Mental status changes began at 19:00.  EXAM: CT HEAD WITHOUT CONTRAST  TECHNIQUE: Contiguous axial images were obtained from the base of the skull through the vertex without intravenous contrast.  COMPARISON:  None.  FINDINGS: There is no intracranial hemorrhage, mass or evidence of acute infarction. There is no extra-axial fluid collection. There is moderate generalized atrophy, greatest in the frontal lobes. There is extensive cerebral white matter hypodensity, greatest in the periventricular regions, nonspecific but consistent with the described history of long-standing demyelinating disease. No acute intracranial findings are evident. No bony abnormality is evident.  IMPRESSION: Severe white matter hypodensities in both cerebral hemispheres, consistent with the described history of longstanding demyelinating disease. Moderate generalized atrophy, greatest in the frontal lobes. No acute findings are  evident.   Electronically Signed   By: Andreas Newport M.D.   On: 08/05/2014 00:41    EKG: Independently reviewed. Sinus tachycardia.  Assessment/Plan Principal Problem:   Acute encephalopathy Active Problems:   Multiple sclerosis   Sepsis   Acute renal failure   1. Acute encephalopathy probably from sepsis - I think patient's mental status changes is from sepsis which probably is from urinary tract infection. I have also ordered EEG to make sure there was no seizure episode. If mental status does not improve then consider MRI brain. 2. Sepsis from UTI - urine shows features consistent with UTI. CT abdomen and pelvis is pending to rule out any abscesses given the previous history of xanthogranulomatous pyelonephritis and iliopsoas abscess. I have placed patient on vancomycin and Zosyn and sepsis protocol. Continue with aggressive hydration and close follow-up on intake output lactic  acid levels procalcitonin levels and blood cultures and urine cultures. 3. Acute on chronic renal failure - follow intake output and CT abdomen and pelvis is pending. Renal sonogram does not show any definite obstruction at this time. Urine does not show any casts. Renal failure may be related to sepsis and we will follow-up pending CT scan to see there is any obstruction. 4. Hypothyroidism - Synthroid given IV for now. Since patient cannot reliably take orally due to encephalopathy. 5. Hyperlipidemia - continue statins and patient can take orally. 6. History of multiple sclerosis - continue medications for multiple sclerosis when patient is more stable.  Addendum - patient CT abdomen and pelvis shows left-sided hydronephrosis with calculus. Also shows possible abscess in the right nephrectomy bed. I have discussed with on-call urologist Dr. Diona Fanti who will be seeing patient in consult. I did discuss with PCCM.   DVT Prophylaxis SCDs in anticipation of possible procedures.  Code Status: Full code.  Family Communication: Patient's daughter.  Disposition Plan: Admit to inpatient.    , N. [facility] Pager [phone].  If 7PM-7AM, please contact night-coverage [email or web address] Password [facility] 08/05/2014, 2:46 AM

## 2014-08-05 NOTE — Consult Note (Signed)
Urology Consult   Physician requesting consult: Toniann Fail  Reason for consult: Infected kidney, stones  History of Present Illness: Jacqueline Norman is a 44 y.o. female who is status post right nephrectomy approximately a year ago by Dr. Dwana Curd for xanthogranulomatous pyelonephritis. She's done well since that time until yesterday, when she began having abdominal discomfort, and had mental status change/became obtunded. She has been having fever as well. Because the patient was difficult to arouse, she was brought to the emergency room. She had a CT of the abdomen and pelvis. This revealed a 17 mm left UPJ stone as well as a smaller left lower pole calculus. There is perinephric stranding. The patient has acute renal insufficiency-creatinine is up to approximately 4. A sinus below 1. Urologic consultation is requested.  The patient's fianc, Chase Picket is with her. He denies her complaining of abdominal pain long-term, and denies any recent medical issues other than her change in mental status last evening. She was last seen in our office in September, 2015. At that time there were no left renal stones.    Past Medical History  Diagnosis Date  . MS (multiple sclerosis)     BED BOUND -UP IN W/C USING HOYER LIFT- UNABLE TO MOVE ARMS AND LEGS - NOT ABLE TO FEED SELF- NO TROUBLE SWALLOWING - STATES SHE EATS WELL; INCONTINENT - WEARS DEPENDS  . Xanthogranulomatous pyelonephritis     RIGHT   . Perinephric abscess     HOSPITALIZED 07/29/13 TO 08/05/13.WITH SEPSIS  . Hypothyroidism   . Anemia     Past Surgical History  Procedure Laterality Date  . No past surgeries    . Robot assisted laparoscopic nephrectomy Right 10/05/2013    Procedure: ROBOTIC ASSISTED LAPAROSCOPIC RADICAL NEPHRECTOMY;  Surgeon: Sebastian Ache, MD;  Location: [facility];  Service: Urology;  Laterality: Right;     Current Hospital Medications: Scheduled Meds: . levothyroxine  25 mcg Intravenous Daily  . piperacillin-tazobactam  (ZOSYN)  IV  3.375 g Intravenous 3 times per day  . [START ON 08/06/2014] vancomycin  1,000 mg Intravenous Q24H   Continuous Infusions: . sodium chloride 150 mL/hr at 08/05/14 0403   PRN Meds:.acetaminophen **OR** acetaminophen, ondansetron **OR** ondansetron (ZOFRAN) IV  Allergies: No Known Allergies  History reviewed. No pertinent family history.  Social History:  reports that she quit smoking about 17 years ago. She has never used smokeless tobacco. She reports that she does not drink alcohol or use illicit drugs.  ROS: A complete review of systems was performed.  All systems are negative except for pertinent findings as noted.  Physical Exam:  Vital signs in last 24 hours: Temp:  [99.4 F (37.4 C)-101.5 F (38.6 C)] 99.4 F (37.4 C) (06/27 0500) Pulse Rate:  [88-109] 109 (06/27 0500) Resp:  [12-15] 14 (06/27 0500) BP: (127-150)/(72-109) 140/84 mmHg (06/27 0500) SpO2:  [97 %-100 %] 98 % (06/27 0500) Weight:  [197 lb 15.6 oz (89.8 kg)-200 lb (90.719 kg)] 197 lb 15.6 oz (89.8 kg) (06/27 0330) General:  The patient is not responsive other than a wince to palpation in the left upper quadrant in the left costovertebral area. HEENT: Normocephalic, atraumatic Neck: No JVD or lymphadenopathy Cardiovascular: Regular rate and rhythm Lungs: Normal inspiratory and expiratory effort Abdomen: Soft, tender in left upper quadrant and left CVA region. Extremities: No edema Neurologic: She does not respond to verbal stimuli  Laboratory Data:   Recent Labs  08/04/14 2327 08/05/14 0500  WBC 11.1* 9.6  HGB 12.4 11.4*  HCT 37.7 35.9*  PLT 257 236     Recent Labs  08/04/14 2327 08/05/14 0500  NA 139 138  K 4.7 4.5  CL 108 109  GLUCOSE 133* 174*  BUN 25* 27*  CALCIUM 9.5 8.5*  CREATININE 3.71* 3.98*     Results for orders placed or performed during the hospital encounter of 08/04/14 (from the past 24 hour(s))  CBC     Status: Abnormal   Collection Time: 08/04/14 11:27 PM   Result Value Ref Range   WBC 11.1 (H) 4.0 - 10.5 K/uL   RBC 4.36 3.87 - 5.11 MIL/uL   Hemoglobin 12.4 12.0 - 15.0 g/dL   HCT 16.1 09.6 - 04.5 %   MCV 86.5 78.0 - 100.0 fL   MCH 28.4 26.0 - 34.0 pg   MCHC 32.9 30.0 - 36.0 g/dL   RDW 40.9 (H) 81.1 - 91.4 %   Platelets 257 150 - 400 K/uL  Comprehensive metabolic panel     Status: Abnormal   Collection Time: 08/04/14 11:27 PM  Result Value Ref Range   Sodium 139 135 - 145 mmol/L   Potassium 4.7 3.5 - 5.1 mmol/L   Chloride 108 101 - 111 mmol/L   CO2 23 22 - 32 mmol/L   Glucose, Bld 133 (H) 65 - 99 mg/dL   BUN 25 (H) 6 - 20 mg/dL   Creatinine, Ser 7.82 (H) 0.44 - 1.00 mg/dL   Calcium 9.5 8.9 - 95.6 mg/dL   Total Protein 7.8 6.5 - 8.1 g/dL   Albumin 3.5 3.5 - 5.0 g/dL   AST 21 15 - 41 U/L   ALT 18 14 - 54 U/L   Alkaline Phosphatase 54 38 - 126 U/L   Total Bilirubin 0.9 0.3 - 1.2 mg/dL   GFR calc non Af Amer 14 (L) >60 mL/min   GFR calc Af Amer 16 (L) >60 mL/min   Anion gap 8 5 - 15  Urinalysis, Routine w reflex microscopic (not at [facility])     Status: Abnormal   Collection Time: 08/04/14 11:28 PM  Result Value Ref Range   Color, Urine YELLOW YELLOW   APPearance TURBID (A) CLEAR   Specific Gravity, Urine 1.010 1.005 - 1.030   pH 6.0 5.0 - 8.0   Glucose, UA NEGATIVE NEGATIVE mg/dL   Hgb urine dipstick MODERATE (A) NEGATIVE   Bilirubin Urine NEGATIVE NEGATIVE   Ketones, ur NEGATIVE NEGATIVE mg/dL   Protein, ur 213 (A) NEGATIVE mg/dL   Urobilinogen, UA 0.2 0.0 - 1.0 mg/dL   Nitrite NEGATIVE NEGATIVE   Leukocytes, UA LARGE (A) NEGATIVE  Urine microscopic-add on     Status: Abnormal   Collection Time: 08/04/14 11:28 PM  Result Value Ref Range   Squamous Epithelial / LPF RARE RARE   WBC, UA TOO NUMEROUS TO COUNT <3 WBC/hpf   RBC / HPF 11-20 <3 RBC/hpf   Bacteria, UA MANY (A) RARE  CBG monitoring, ED     Status: Abnormal   Collection Time: 08/04/14 11:33 PM  Result Value Ref Range   Glucose-Capillary 124 (H) 65 - 99 mg/dL   I-Stat CG4 Lactic Acid, ED     Status: None   Collection Time: 08/04/14 11:41 PM  Result Value Ref Range   Lactic Acid, Venous 1.19 0.5 - 2.0 mmol/L  Blood gas, arterial     Status: Abnormal   Collection Time: 08/05/14  2:12 AM  Result Value Ref Range   FIO2 0.21 %   pH, Arterial 7.380 7.350 -  7.450   pCO2 arterial 31.5 (L) 35.0 - 45.0 mmHg   pO2, Arterial 89.3 80.0 - 100.0 mmHg   Bicarbonate 17.9 (L) 20.0 - 24.0 mEq/L   TCO2 16.3 0 - 100 mmol/L   Acid-base deficit 5.5 (H) 0.0 - 2.0 mmol/L   O2 Saturation 96.1 %   Patient temperature 100.8    Collection site LEFT RADIAL    Drawn by [phone]    Sample type ARTERIAL DRAW    Allens test (pass/fail) PASS PASS  CBG monitoring, ED     Status: None   Collection Time: 08/05/14  2:32 AM  Result Value Ref Range   Glucose-Capillary 96 65 - 99 mg/dL   Comment 1 Notify RN   MRSA PCR Screening     Status: None   Collection Time: 08/05/14  4:18 AM  Result Value Ref Range   MRSA by PCR NEGATIVE NEGATIVE  Comprehensive metabolic panel     Status: Abnormal   Collection Time: 08/05/14  5:00 AM  Result Value Ref Range   Sodium 138 135 - 145 mmol/L   Potassium 4.5 3.5 - 5.1 mmol/L   Chloride 109 101 - 111 mmol/L   CO2 19 (L) 22 - 32 mmol/L   Glucose, Bld 174 (H) 65 - 99 mg/dL   BUN 27 (H) 6 - 20 mg/dL   Creatinine, Ser 6.04 (H) 0.44 - 1.00 mg/dL   Calcium 8.5 (L) 8.9 - 10.3 mg/dL   Total Protein 7.2 6.5 - 8.1 g/dL   Albumin 3.0 (L) 3.5 - 5.0 g/dL   AST 18 15 - 41 U/L   ALT 15 14 - 54 U/L   Alkaline Phosphatase 49 38 - 126 U/L   Total Bilirubin 1.2 0.3 - 1.2 mg/dL   GFR calc non Af Amer 13 (L) >60 mL/min   GFR calc Af Amer 15 (L) >60 mL/min   Anion gap 10 5 - 15  CBC with Differential/Platelet     Status: Abnormal   Collection Time: 08/05/14  5:00 AM  Result Value Ref Range   WBC 9.6 4.0 - 10.5 K/uL   RBC 4.08 3.87 - 5.11 MIL/uL   Hemoglobin 11.4 (L) 12.0 - 15.0 g/dL   HCT 54.0 (L) 98.1 - 19.1 %   MCV 88.0 78.0 - 100.0 fL   MCH 27.9  26.0 - 34.0 pg   MCHC 31.8 30.0 - 36.0 g/dL   RDW 47.8 (H) 29.5 - 62.1 %   Platelets 236 150 - 400 K/uL   Neutrophils Relative % 77 43 - 77 %   Neutro Abs 7.4 1.7 - 7.7 K/uL   Lymphocytes Relative 13 12 - 46 %   Lymphs Abs 1.2 0.7 - 4.0 K/uL   Monocytes Relative 10 3 - 12 %   Monocytes Absolute 0.9 0.1 - 1.0 K/uL   Eosinophils Relative 0 0 - 5 %   Eosinophils Absolute 0.0 0.0 - 0.7 K/uL   Basophils Relative 0 0 - 1 %   Basophils Absolute 0.0 0.0 - 0.1 K/uL  Lactic acid, plasma     Status: None   Collection Time: 08/05/14  5:00 AM  Result Value Ref Range   Lactic Acid, Venous 1.1 0.5 - 2.0 mmol/L  Procalcitonin     Status: None   Collection Time: 08/05/14  5:00 AM  Result Value Ref Range   Procalcitonin 0.67 ng/mL  Protime-INR     Status: Abnormal   Collection Time: 08/05/14  5:00 AM  Result Value Ref Range  Prothrombin Time 15.3 (H) 11.6 - 15.2 seconds   INR 1.19 0.00 - 1.49  APTT     Status: None   Collection Time: 08/05/14  5:00 AM  Result Value Ref Range   aPTT 31 24 - 37 seconds  TSH     Status: None   Collection Time: 08/05/14  5:00 AM  Result Value Ref Range   TSH 1.980 0.350 - 4.500 uIU/mL  Type and screen     Status: None   Collection Time: 08/05/14  5:00 AM  Result Value Ref Range   ABO/RH(D) O POS    Antibody Screen NEG    Sample Expiration 08/08/2014    Recent Results (from the past 240 hour(s))  MRSA PCR Screening     Status: None   Collection Time: 08/05/14  4:18 AM  Result Value Ref Range Status   MRSA by PCR NEGATIVE NEGATIVE Final    Comment:        The GeneXpert MRSA Assay (FDA approved for NASAL specimens only), is one component of a comprehensive MRSA colonization surveillance program. It is not intended to diagnose MRSA infection nor to guide or monitor treatment for MRSA infections.     Renal Function:  Recent Labs  08/04/14 2327 08/05/14 0500  CREATININE 3.71* 3.98*   Estimated Creatinine Clearance: 22.2 mL/min (by C-G formula  based on Cr of 3.98).  Radiologic Imaging: Ct Abdomen Pelvis Wo Contrast  08/05/2014   CLINICAL DATA:  Sepsis.  Leukocytosis.  EXAM: CT ABDOMEN AND PELVIS WITHOUT CONTRAST  TECHNIQUE: Multidetector CT imaging of the abdomen and pelvis was performed following the standard protocol without IV contrast.  COMPARISON:  08/29/2013  FINDINGS: There is a 1.7 cm calculus in the left renal pelvis. There is another 1.7 cm calculus in the lower pole left collecting system. These are new from 08/29/2013. There is moderate left hydronephrosis. There is left perinephric stranding. The left ureter is normal in caliber, with no ureteral calculi.  In the right nephrectomy bed, there is a 2.4 x 4.3 by 3.7 cm complex -appearing collection which could represent residual abscess or residual inflammation. The nephrectomy bed is otherwise unremarkable.  There is cholelithiasis.  There are unremarkable unenhanced appearances of the liver, spleen, pancreas and adrenals.  Bowel is unremarkable.  Uterus and ovaries appear unremarkable.  Abdominal aorta is normal in caliber with mild atherosclerotic calcification.  There is mild right lung base opacity which could be atelectatic or infectious. There are no effusions.  No significant musculoskeletal lesion is evident. Moderately severe degenerative disc and facet changes are present in the lower lumbar spine. There is a bilateral L5 spondylolysis with minimal spondylolisthesis.  IMPRESSION: 1. New left nephrolithiasis and hydronephrosis. No ureteral calculi are evident. There is a 1.7 cm left renal pelvic calculus and another 1.7 cm left lower pole collecting system calculus. 2. 2.4 x 3.7 x 4.3 cm right nephrectomy bed collection which could represent residual abscess or inflammation. 3. Cholelithiasis without evidence of an acute process in the biliary system. 4. Mild right lung base opacity due to atelectasis or infection. 5. Incidentally noted spondylolysis and minimal spondylolisthesis  at L5. Lower lumbar degenerative disc and facet changes.   Electronically Signed   By: Ellery Plunk M.D.   On: 08/05/2014 03:12   Ct Head Wo Contrast  08/05/2014   CLINICAL DATA:  Altered mental status. History of multiple sclerosis. Mental status changes began at 19:00.  EXAM: CT HEAD WITHOUT CONTRAST  TECHNIQUE: Contiguous axial images were  obtained from the base of the skull through the vertex without intravenous contrast.  COMPARISON:  None.  FINDINGS: There is no intracranial hemorrhage, mass or evidence of acute infarction. There is no extra-axial fluid collection. There is moderate generalized atrophy, greatest in the frontal lobes. There is extensive cerebral white matter hypodensity, greatest in the periventricular regions, nonspecific but consistent with the described history of long-standing demyelinating disease. No acute intracranial findings are evident. No bony abnormality is evident.  IMPRESSION: Severe white matter hypodensities in both cerebral hemispheres, consistent with the described history of longstanding demyelinating disease. Moderate generalized atrophy, greatest in the frontal lobes. No acute findings are evident.   Electronically Signed   By: Ellery Plunk M.D.   On: 08/05/2014 00:41   US Renal  08/05/2014   CLINICAL DATA:  Renal insufficiency.  Right nephrectomy.  EXAM: RENAL / URINARY TRACT ULTRASOUND COMPLETE  COMPARISON:  CT 08/29/2013  FINDINGS: Right Kidney:  Absent.  Prior nephrectomy.  Left Kidney:  Length: 14.6 cm. There is a lower pole left renal calculus with branching morphology suggesting early staghorn formation. No hydronephrosis.  Bladder:  Grossly normal with limited visibility due to low volume.  IMPRESSION: Lower pole left nephrolithiasis with branching morphology suggesting early staghorn. No hydronephrosis.   Electronically Signed   By: Ellery Plunk M.D.   On: 08/05/2014 02:52   Dg Chest Port 1 View  08/05/2014   CLINICAL DATA:  Sepsis.  EXAM:  PORTABLE CHEST - 1 VIEW  COMPARISON:  02/03/2004  FINDINGS: There is elevation of right hemidiaphragm with adjacent linear opacity, likely atelectasis. Lung volumes are low. Cardiomediastinal contours are normal for technique. No large pleural effusion or pneumothorax. No pulmonary edema.  IMPRESSION: Hypoventilatory chest with elevation of right hemidiaphragm and adjacent linear opacities, likely atelectasis.   Electronically Signed   By: Rubye Oaks M.D.   On: 08/05/2014 02:45    I independently reviewed the above imaging and laboratory studies.  Impression/Assessment:  1. Left renal calculi, with dominant stone at the left UPJ with associated left hydronephrosis. She has associated urine or tract infection. She also has acute renal insufficiency.  2. Fluid collection in right renal bed-this may be an abscess, but may well be postoperative fluid collection without infection.  Plan:  1. The patient will need urgent drainage of her left kidney. As she has 2 large left renal calculi, these will eventually need to be treated. I think the best initial option to drain the kidney would be percutaneous nephrostomy tube, as she will need percutaneous access for her stone management eventually  2. I will discuss this with interventional radiology and hopefully we will be able to proceed urgently with this  3. Regarding her fluid collection in the right renal bed-I do not think that she needs urgent management of this. If her clinical situation improves rapidly with percutaneous nephrostomy tube placement, this can be followed eventually. If she does not improve significantly with this, I think that thought should be given to draining this.  I spoke with the patient's fiance, Chase Picket regarding this-we will proceed urgently this morning.

## 2014-08-05 NOTE — Procedures (Signed)
L 75f Perc neph placed No complication No blood loss. See complete dictation in [facility].

## 2014-08-05 NOTE — Progress Notes (Signed)
Date:  August 05, 2014 U.R. performed for needs and level of care. Will continue to follow for Case Management needs.  Rhonda Davis, RN, BSN, CCM   [phone] 

## 2014-08-05 NOTE — Progress Notes (Signed)
Patient seen and evaluated earlier the same by my associate. Urologist also consulted and on board. We'll continue with current assessment and plan as outlined by both.  Last vital signs reviewed and stable and last WBC levels within normal limits.  We'll plan on reassessing next a.m. unless there is an acute medical issue requiring reassessment.  , [employer]

## 2014-08-05 NOTE — Procedures (Addendum)
History: 44 yo F with altered mental status in the setting of sepsis and renal failure.   Sedation: None  Technique: This is a 19 channel routine scalp EEG performed at the bedside with bipolar and monopolar montages arranged in accordance to the international 10/20 system of electrode placement. One channel was dedicated to EKG recording.    Background: The background consists of generalized irregular delta and theta activity that is discontinuous at times. In addition, there are sharply contoured discharges with a shifting bifrontal predominance. These are at times quasi-periodic with a frequency of 1 Hz, but there is no evolution.   Photic stimulation: Physiologic driving is not performed.   EEG Abnormalities: 1) Triphasic waves 2) Generalized irregular slow activity 3) Discontinuous background.   Clinical Interpretation: This EEG is most consistent with a moderate - severe generalized non-specific cerebral dysfunction(encephalopathy). There was no seizure or clear seizure predisposition recorded on this study.   Ritta Slot, MD [facility] [phone]  If 7pm- 7am, please page neurology on call as listed in [facility].

## 2014-08-05 NOTE — Progress Notes (Signed)
ANTIBIOTIC CONSULT NOTE - INITIAL  Pharmacy Consult for Vancomycin and Zosyn  Indication: sepsis  No Known Allergies  Patient Measurements: Height: 5\' 10"  (177.8 cm) Weight: 200 lb (90.719 kg) IBW/kg (Calculated) : 68.5 Adjusted Body Weight:   Vital Signs: Temp: 100.8 F (38.2 C) (06/27 0330) Temp Source: Rectal (06/27 0330) BP: 141/90 mmHg (06/27 0400) Pulse Rate: 101 (06/27 0400) Intake/Output from previous day:   Intake/Output from this shift:    Labs:  Recent Labs  08/04/14 2327  WBC 11.1*  HGB 12.4  PLT 257  CREATININE 3.71*   Estimated Creatinine Clearance: 23.9 mL/min (by C-G formula based on Cr of 3.71). No results for input(s): VANCOTROUGH, VANCOPEAK, VANCORANDOM, GENTTROUGH, GENTPEAK, GENTRANDOM, TOBRATROUGH, TOBRAPEAK, TOBRARND, AMIKACINPEAK, AMIKACINTROU, AMIKACIN in the last 72 hours.   Microbiology: No results found for this or any previous visit (from the past 720 hour(s)).  Medical History: Past Medical History  Diagnosis Date  . MS (multiple sclerosis)     BED BOUND -UP IN W/C USING HOYER LIFT- UNABLE TO MOVE ARMS AND LEGS - NOT ABLE TO FEED SELF- NO TROUBLE SWALLOWING - STATES SHE EATS WELL; INCONTINENT - WEARS DEPENDS  . Xanthogranulomatous pyelonephritis     RIGHT   . Perinephric abscess     HOSPITALIZED 07/29/13 TO 08/05/13.WITH SEPSIS  . Hypothyroidism   . Anemia     Medications:  Anti-infectives    Start     Dose/Rate Route Frequency Ordered Stop   08/06/14 0600  vancomycin (VANCOCIN) IVPB 1000 mg/200 mL premix     1,000 mg 200 mL/hr over 60 Minutes Intravenous Every 24 hours 08/05/14 0420     08/05/14 0345  piperacillin-tazobactam (ZOSYN) IVPB 3.375 g     3.375 g 12.5 mL/hr over 240 Minutes Intravenous 3 times per day 08/05/14 0343     08/05/14 0345  vancomycin (VANCOCIN) IVPB 1000 mg/200 mL premix     1,000 mg 200 mL/hr over 60 Minutes Intravenous  Once 08/05/14 0343     08/05/14 0130  cefTRIAXone (ROCEPHIN) 1 g in dextrose 5 %  50 mL IVPB     1 g 100 mL/hr over 30 Minutes Intravenous  Once 08/05/14 0116 08/05/14 0313     Assessment: Patient with MS, chronic quadriplegia in ED with AMS.  Possible urosepsis noted in ED note.  Patient with reduced renal function.  Goal of Therapy:  Vancomycin trough level 15-20 mcg/ml  Zosyn based on renal function   Plan:  Measure antibiotic drug levels at steady state Follow up culture results Vancomycin 1gm iv q24hr  Zosyn 3.375g IV Q8H infused over 4hrs.   Darlina Guys, Jacquenette Shone Crowford 08/05/2014,4:22 AM

## 2014-08-05 NOTE — Care Management Note (Signed)
Case Management Note  Patient Details  Name: Jacqueline Norman MRN: [id number] Date of Birth: [birth year]  Subjective/Objective:             Sepsis with uti and ams, temp 101.5       Action/Plan: home   Expected Discharge Date:       [id number]           Expected Discharge Plan:  Home/Self Care  In-House Referral:  NA  Discharge planning Services  CM Consult  Post Acute Care Choice:  NA Choice offered to:  NA  DME Arranged:  N/A DME Agency:  NA  HH Arranged:  NA HH Agency:  NA  Status of Service:  In process, will continue to follow  Medicare Important Message Given:    Date Medicare IM Given:    Medicare IM give by:    Date Additional Medicare IM Given:    Additional Medicare Important Message give by:     If discussed at Long Length of Stay Meetings, dates discussed:    Additional Comments:  Golda Acre, RN 08/05/2014, 8:54 AM

## 2014-08-05 NOTE — Progress Notes (Signed)
Offsite EEG completed at [facility]. Results pending. 

## 2014-08-05 NOTE — Consult Note (Signed)
Chief Complaint: Chief Complaint  Patient presents with  . Altered Mental Status    Referring Physician(s): Dahlstedt,S  History of Present Illness: Jacqueline Norman is a 44 y.o. 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 She's done well since that time until yesterday, when she began having abdominal discomfort, and had mental status change/became obtunded. She has been having fever as well. Because the patient was difficult to arouse, she was brought to the emergency room. She had a CT of the abdomen and pelvis. This revealed a 17 mm left UPJ stone as well as a smaller left lower pole calculus and hydronephrosis as well as right nephrectomy bed collection,?abscess. There is perinephric stranding. The patient has acute renal insufficiency-creatinine is up to approximately 4. Request has now been received from urology for left percutaneous nephrostomy.  Past Medical History  Diagnosis Date  . MS (multiple sclerosis)     BED BOUND -UP IN W/C USING HOYER LIFT- UNABLE TO MOVE ARMS AND LEGS - NOT ABLE TO FEED SELF- NO TROUBLE SWALLOWING - STATES SHE EATS WELL; INCONTINENT - WEARS DEPENDS  . Xanthogranulomatous pyelonephritis     RIGHT   . Perinephric abscess     HOSPITALIZED 07/29/13 TO 08/05/13.WITH SEPSIS  . Hypothyroidism   . Anemia     Past Surgical History  Procedure Laterality Date  . No past surgeries    . Robot assisted laparoscopic nephrectomy Right 10/05/2013    Procedure: ROBOTIC ASSISTED LAPAROSCOPIC RADICAL NEPHRECTOMY;  Surgeon: Alexis Frock, MD;  Location: [facility];  Service: Urology;  Laterality: Right;    Allergies: Review of patient's allergies indicates no known allergies.  Medications: Prior to Admission medications   Medication Sig Start Date End Date Taking? Authorizing Provider  baclofen (LIORESAL) 10 MG tablet Take 10 mg by mouth 2 (two) times daily.   Yes Historical  Provider, MD  benztropine (COGENTIN) 1 MG tablet Take 1 mg by mouth 2 (two) times daily.   Yes Historical Provider, MD  cholecalciferol (VITAMIN D) 1000 UNITS tablet Take 1,000 Units by mouth every morning.   Yes Historical Provider, MD  docusate sodium (COLACE) 100 MG capsule Take 100 mg by mouth daily as needed for mild constipation.   Yes Historical Provider, MD  ferrous sulfate 325 (65 FE) MG tablet Take 1 tablet (325 mg total) by mouth 3 (three) times daily with meals. 08/05/13  Yes Belkys A Regalado, MD  Interferon Beta-1b (BETASERON/EXTAVIA) 0.3 MG KIT injection Inject 0.3 mg into the skin every other day.   Yes Historical Provider, MD  levothyroxine (SYNTHROID, LEVOTHROID) 50 MCG tablet Take 50 mcg by mouth daily before breakfast.   Yes Historical Provider, MD  omega-3 acid ethyl esters (LOVAZA) 1 G capsule Take 1 g by mouth 2 (two) times daily.    Yes Historical Provider, MD  potassium chloride SA (K-DUR,KLOR-CON) 20 MEQ tablet Take 20 mEq by mouth every morning.   Yes Historical Provider, MD  simvastatin (ZOCOR) 5 MG tablet Take 5 mg by mouth at bedtime.    Yes Historical Provider, MD  ciprofloxacin (CIPRO) 500 MG tablet Take 1 tablet (500 mg total) by mouth 2 (two) times daily. X 7 days to prevent post-op infection 10/08/13   Alexis Frock, MD  oxyCODONE-acetaminophen (PERCOCET/ROXICET) 5-325 MG per tablet Take 1-2 tablets by mouth every 6 (six) hours as needed for moderate pain. Post-operatively Patient not taking: Reported on 08/05/2014 10/08/13   Hubbard Robinson  Tresa Moore, MD     History reviewed. No pertinent family history.  History   Social History  . Marital Status: Legally Separated    Spouse Name: N/A  . Number of Children: N/A  . Years of Education: N/A   Social History Main Topics  . Smoking status: Former Smoker    Quit date: 08/08/1997  . Smokeless tobacco: Never Used  . Alcohol Use: No  . Drug Use: No  . Sexual Activity: Not on file   Other Topics Concern  . None    Social History Narrative      Review of Systems see above ; unable to obtain additional hx due to pt's MS change  Vital Signs: BP 107/65 mmHg  Pulse 94  Temp(Src) 99 F (37.2 C) (Oral)  Resp 12  Ht 5' 10"  (1.778 m)  Wt 197 lb 15.6 oz (89.8 kg)  BMI 28.41 kg/m2  SpO2 97%  LMP   Physical Exam ; pt obtunded/not responsive except to painful stimuli; chest- sl dim BS bases; heart- RRR; abd- soft,few BS, mild tenderness left CVA region; ext- no edema   Mallampati Score:     Imaging: Ct Abdomen Pelvis Wo Contrast  08/05/2014   CLINICAL DATA:  Sepsis.  Leukocytosis.  EXAM: CT ABDOMEN AND PELVIS WITHOUT CONTRAST  TECHNIQUE: Multidetector CT imaging of the abdomen and pelvis was performed following the standard protocol without IV contrast.  COMPARISON:  08/29/2013  FINDINGS: There is a 1.7 cm calculus in the left renal pelvis. There is another 1.7 cm calculus in the lower pole left collecting system. These are new from 08/29/2013. There is moderate left hydronephrosis. There is left perinephric stranding. The left ureter is normal in caliber, with no ureteral calculi.  In the right nephrectomy bed, there is a 2.4 x 4.3 by 3.7 cm complex -appearing collection which could represent residual abscess or residual inflammation. The nephrectomy bed is otherwise unremarkable.  There is cholelithiasis.  There are unremarkable unenhanced appearances of the liver, spleen, pancreas and adrenals.  Bowel is unremarkable.  Uterus and ovaries appear unremarkable.  Abdominal aorta is normal in caliber with mild atherosclerotic calcification.  There is mild right lung base opacity which could be atelectatic or infectious. There are no effusions.  No significant musculoskeletal lesion is evident. Moderately severe degenerative disc and facet changes are present in the lower lumbar spine. There is a bilateral L5 spondylolysis with minimal spondylolisthesis.  IMPRESSION: 1. New left nephrolithiasis and  hydronephrosis. No ureteral calculi are evident. There is a 1.7 cm left renal pelvic calculus and another 1.7 cm left lower pole collecting system calculus. 2. 2.4 x 3.7 x 4.3 cm right nephrectomy bed collection which could represent residual abscess or inflammation. 3. Cholelithiasis without evidence of an acute process in the biliary system. 4. Mild right lung base opacity due to atelectasis or infection. 5. Incidentally noted spondylolysis and minimal spondylolisthesis at L5. Lower lumbar degenerative disc and facet changes.   Electronically Signed   By: Andreas Newport M.D.   On: 08/05/2014 03:12   Ct Head Wo Contrast  08/05/2014   CLINICAL DATA:  Altered mental status. History of multiple sclerosis. Mental status changes began at 19:00.  EXAM: CT HEAD WITHOUT CONTRAST  TECHNIQUE: Contiguous axial images were obtained from the base of the skull through the vertex without intravenous contrast.  COMPARISON:  None.  FINDINGS: There is no intracranial hemorrhage, mass or evidence of acute infarction. There is no extra-axial fluid collection. There is moderate generalized atrophy,  greatest in the frontal lobes. There is extensive cerebral white matter hypodensity, greatest in the periventricular regions, nonspecific but consistent with the described history of long-standing demyelinating disease. No acute intracranial findings are evident. No bony abnormality is evident.  IMPRESSION: Severe white matter hypodensities in both cerebral hemispheres, consistent with the described history of longstanding demyelinating disease. Moderate generalized atrophy, greatest in the frontal lobes. No acute findings are evident.   Electronically Signed   By: Andreas Newport M.D.   On: 08/05/2014 00:41   US Renal  08/05/2014   CLINICAL DATA:  Renal insufficiency.  Right nephrectomy.  EXAM: RENAL / URINARY TRACT ULTRASOUND COMPLETE  COMPARISON:  CT 08/29/2013  FINDINGS: Right Kidney:  Absent.  Prior nephrectomy.  Left Kidney:   Length: 14.6 cm. There is a lower pole left renal calculus with branching morphology suggesting early staghorn formation. No hydronephrosis.  Bladder:  Grossly normal with limited visibility due to low volume.  IMPRESSION: Lower pole left nephrolithiasis with branching morphology suggesting early staghorn. No hydronephrosis.   Electronically Signed   By: Andreas Newport M.D.   On: 08/05/2014 02:52   Dg Chest Port 1 View  08/05/2014   CLINICAL DATA:  Sepsis.  EXAM: PORTABLE CHEST - 1 VIEW  COMPARISON:  02/03/2004  FINDINGS: There is elevation of right hemidiaphragm with adjacent linear opacity, likely atelectasis. Lung volumes are low. Cardiomediastinal contours are normal for technique. No large pleural effusion or pneumothorax. No pulmonary edema.  IMPRESSION: Hypoventilatory chest with elevation of right hemidiaphragm and adjacent linear opacities, likely atelectasis.   Electronically Signed   By: Jeb Levering M.D.   On: 08/05/2014 02:45    Labs:  CBC:  Recent Labs  10/05/13 0938  10/06/13 0418 10/07/13 0816 08/04/14 2327 08/05/14 0500  WBC 4.8  --   --  8.2 11.1* 9.6  HGB 10.9*  < > 10.2* 9.7* 12.4 11.4*  HCT 34.8*  < > 33.6* 31.4* 37.7 35.9*  PLT 281  --   --  220 257 236  < > = values in this interval not displayed.  COAGS:  Recent Labs  08/05/14 0500  INR 1.19  APTT 31    BMP:  Recent Labs  10/06/13 0418 10/07/13 0816 08/04/14 2327 08/05/14 0500  NA 134* 136* 139 138  K 3.8 3.3* 4.7 4.5  CL 98 102 108 109  CO2 23 24 23  19*  GLUCOSE 125* 110* 133* 174*  BUN 5* 3* 25* 27*  CALCIUM 8.6 8.6 9.5 8.5*  CREATININE 0.67 0.75 3.71* 3.98*  GFRNONAA >90 >90 14* 13*  GFRAA >90 >90 16* 15*    LIVER FUNCTION TESTS:  Recent Labs  08/04/14 2327 08/05/14 0500  BILITOT 0.9 1.2  AST 21 18  ALT 18 15  ALKPHOS 54 49  PROT 7.8 7.2  ALBUMIN 3.5 3.0*    TUMOR MARKERS: No results for input(s): AFPTM, CEA, CA199, CHROMGRNA in the last 8760 hours.  Assessment  and Plan: GRAZIELLA CONNERY is a 44 y.o. 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 She's done well since that time until yesterday, when she began having abdominal discomfort, and had mental status change/became obtunded. She has been having fever as well. Because the patient was difficult to arouse, she was brought to the emergency room. She had a CT of the abdomen and pelvis. This revealed a 17 mm left UPJ stone as well as a smaller left lower pole calculus  and hydronephrosis as well as right nephrectomy bed collection,?abscess. There is perinephric stranding. The patient has acute renal insufficiency-creatinine is up to approximately 4. Request has now been received from urology for left percutaneous nephrostomy.Imaging studies were reviewed by Dr. Vernard Gambles. Details/risks of procedure , including but not limited to , internal bleeding, infection/sepsis, injury to adjacent organs d/w pt's mother/POA, Jacqueline Norman, with her understanding and consent. Case tent planned for this am.  Pt also for EEG today.   Signed: D. Rowe Robert 08/05/2014, 8:55 AM   I spent a total of 20 minutes  in face to face in clinical consultation, greater than 50% of which was counseling/coordinating care for left percutaneous nephrostomy

## 2014-08-05 NOTE — ED Provider Notes (Signed)
CSN: [id number]     Arrival date & time 08/04/14  2303 History   First MD Initiated Contact with Patient 08/04/14 2309     Chief Complaint  Patient presents with  . Altered Mental Status     (Consider location/radiation/quality/duration/timing/severity/associated sxs/prior Treatment) HPI Comments: LEVEL 5 CAVEAT FOR ALTERED MENTAL STATUS  44 y.o. female with Past medical history of multiple sclerosis with chronic quadriplegia and bedbound status and s/p right sided nephrectomy for perinephric abscess comes in with AMS. Family at bedside, and they report that pt was last normal around 7 pm. At 9, they noticed that pt was very somnolent and hard to arouse. Pt had mentioned of abd pain today. No fevers at home, no cough, emesis, diarrhea. RN reports, that when getting urine, they noticed discharge and foul smelling urine.   Patient is a 44 y.o. female presenting with altered mental status. The history is provided by a relative, a parent and a friend. The history is limited by the condition of the patient.  Altered Mental Status   Past Medical History  Diagnosis Date  . MS (multiple sclerosis)     BED BOUND -UP IN W/C USING HOYER LIFT- UNABLE TO MOVE ARMS AND LEGS - NOT ABLE TO FEED SELF- NO TROUBLE SWALLOWING - STATES SHE EATS WELL; INCONTINENT - WEARS DEPENDS  . Xanthogranulomatous pyelonephritis     RIGHT   . Perinephric abscess     HOSPITALIZED 07/29/13 TO 08/05/13.WITH SEPSIS  . Hypothyroidism   . Anemia    Past Surgical History  Procedure Laterality Date  . No past surgeries    . Robot assisted laparoscopic nephrectomy Right 10/05/2013    Procedure: ROBOTIC ASSISTED LAPAROSCOPIC RADICAL NEPHRECTOMY;  Surgeon: Alexis Frock, MD;  Location: [facility];  Service: Urology;  Laterality: Right;   History reviewed. No pertinent family history. History  Substance Use Topics  . Smoking status: Former Smoker    Quit date: 08/08/1997  . Smokeless tobacco: Never Used  . Alcohol Use: No    OB History    No data available     Review of Systems  Unable to perform ROS: Mental status change      Allergies  Review of patient's allergies indicates no known allergies.  Home Medications   Prior to Admission medications   Medication Sig Start Date End Date Taking? Authorizing Provider  baclofen (LIORESAL) 10 MG tablet Take 10 mg by mouth 2 (two) times daily.   Yes Historical Provider, MD  benztropine (COGENTIN) 1 MG tablet Take 1 mg by mouth 2 (two) times daily.   Yes Historical Provider, MD  cholecalciferol (VITAMIN D) 1000 UNITS tablet Take 1,000 Units by mouth every morning.   Yes Historical Provider, MD  docusate sodium (COLACE) 100 MG capsule Take 100 mg by mouth daily as needed for mild constipation.   Yes Historical Provider, MD  ferrous sulfate 325 (65 FE) MG tablet Take 1 tablet (325 mg total) by mouth 3 (three) times daily with meals. 08/05/13  Yes Belkys A Regalado, MD  Interferon Beta-1b (BETASERON/EXTAVIA) 0.3 MG KIT injection Inject 0.3 mg into the skin every other day.   Yes Historical Provider, MD  levothyroxine (SYNTHROID, LEVOTHROID) 50 MCG tablet Take 50 mcg by mouth daily before breakfast.   Yes Historical Provider, MD  omega-3 acid ethyl esters (LOVAZA) 1 G capsule Take 1 g by mouth 2 (two) times daily.    Yes Historical Provider, MD  potassium chloride SA (K-DUR,KLOR-CON) 20 MEQ tablet Take 20 mEq by  mouth every morning.   Yes Historical Provider, MD  simvastatin (ZOCOR) 5 MG tablet Take 5 mg by mouth at bedtime.    Yes Historical Provider, MD  ciprofloxacin (CIPRO) 500 MG tablet Take 1 tablet (500 mg total) by mouth 2 (two) times daily. X 7 days to prevent post-op infection 10/08/13   Alexis Frock, MD  oxyCODONE-acetaminophen (PERCOCET/ROXICET) 5-325 MG per tablet Take 1-2 tablets by mouth every 6 (six) hours as needed for moderate pain. Post-operatively Patient not taking: Reported on 08/05/2014 10/08/13   Alexis Frock, MD   BP 129/70 mmHg  Pulse 93   Temp(Src) 98.1 F (36.7 C) (Oral)  Resp 18  Ht 5' 5"  (1.651 m)  Wt 196 lb 6.9 oz (89.1 kg)  BMI 32.69 kg/m2  SpO2 100%  LMP  Physical Exam  Constitutional: She appears well-developed and well-nourished.  HENT:  Head: Atraumatic.  Eyes: EOM are normal.  Neck: Neck supple.  Cardiovascular: Regular rhythm and normal heart sounds.   Pulmonary/Chest: Effort normal. No respiratory distress.  Abdominal: Soft. She exhibits no distension. There is tenderness. There is no rebound and no guarding.  Lower quadrant and right sided tenderness  Musculoskeletal: She exhibits no edema.  Neurological:  arousable  Skin: Skin is warm and dry.  Nursing note and vitals reviewed.   ED Course  Procedures (including critical care time) Labs Review Labs Reviewed  CBC - Abnormal; Notable for the following:    WBC 11.1 (*)    RDW 16.7 (*)    All other components within normal limits  COMPREHENSIVE METABOLIC PANEL - Abnormal; Notable for the following:    Glucose, Bld 133 (*)    BUN 25 (*)    Creatinine, Ser 3.71 (*)    GFR calc non Af Amer 14 (*)    GFR calc Af Amer 16 (*)    All other components within normal limits  URINALYSIS, ROUTINE W REFLEX MICROSCOPIC (NOT AT [facility]) - Abnormal; Notable for the following:    APPearance TURBID (*)    Hgb urine dipstick MODERATE (*)    Protein, ur 100 (*)    Leukocytes, UA LARGE (*)    All other components within normal limits  URINE MICROSCOPIC-ADD ON - Abnormal; Notable for the following:    Bacteria, UA MANY (*)    All other components within normal limits  BLOOD GAS, ARTERIAL - Abnormal; Notable for the following:    pCO2 arterial 31.5 (*)    Bicarbonate 17.9 (*)    Acid-base deficit 5.5 (*)    All other components within normal limits  COMPREHENSIVE METABOLIC PANEL - Abnormal; Notable for the following:    CO2 19 (*)    Glucose, Bld 174 (*)    BUN 27 (*)    Creatinine, Ser 3.98 (*)    Calcium 8.5 (*)    Albumin 3.0 (*)    GFR calc non Af Amer  13 (*)    GFR calc Af Amer 15 (*)    All other components within normal limits  CBC WITH DIFFERENTIAL/PLATELET - Abnormal; Notable for the following:    Hemoglobin 11.4 (*)    HCT 35.9 (*)    RDW 17.1 (*)    All other components within normal limits  PROTIME-INR - Abnormal; Notable for the following:    Prothrombin Time 15.3 (*)    All other components within normal limits  GLUCOSE, CAPILLARY - Abnormal; Notable for the following:    Glucose-Capillary 125 (*)    All other components  within normal limits  GLUCOSE, CAPILLARY - Abnormal; Notable for the following:    Glucose-Capillary 116 (*)    All other components within normal limits  GLUCOSE, CAPILLARY - Abnormal; Notable for the following:    Glucose-Capillary 112 (*)    All other components within normal limits  GLUCOSE, CAPILLARY - Abnormal; Notable for the following:    Glucose-Capillary 109 (*)    All other components within normal limits  GLUCOSE, CAPILLARY - Abnormal; Notable for the following:    Glucose-Capillary 106 (*)    All other components within normal limits  COMPREHENSIVE METABOLIC PANEL - Abnormal; Notable for the following:    Chloride 113 (*)    CO2 20 (*)    Glucose, Bld 161 (*)    Creatinine, Ser 2.14 (*)    Calcium 8.5 (*)    Albumin 2.8 (*)    GFR calc non Af Amer 27 (*)    GFR calc Af Amer 31 (*)    All other components within normal limits  CBC - Abnormal; Notable for the following:    RBC 3.66 (*)    Hemoglobin 10.4 (*)    HCT 32.2 (*)    RDW 17.3 (*)    All other components within normal limits  GLUCOSE, CAPILLARY - Abnormal; Notable for the following:    Glucose-Capillary 131 (*)    All other components within normal limits  GLUCOSE, CAPILLARY - Abnormal; Notable for the following:    Glucose-Capillary 131 (*)    All other components within normal limits  GLUCOSE, CAPILLARY - Abnormal; Notable for the following:    Glucose-Capillary 135 (*)    All other components within normal limits   CBG MONITORING, ED - Abnormal; Notable for the following:    Glucose-Capillary 124 (*)    All other components within normal limits  CULTURE, BLOOD (ROUTINE X 2)  CULTURE, BLOOD (ROUTINE X 2)  URINE CULTURE  MRSA PCR SCREENING  URINE CULTURE  TISSUE CULTURE  LACTIC ACID, PLASMA  PROCALCITONIN  APTT  TSH  GLUCOSE, CAPILLARY  BASIC METABOLIC PANEL  I-STAT CG4 LACTIC ACID, ED  I-STAT CG4 LACTIC ACID, ED  CBG MONITORING, ED  TYPE AND SCREEN    Imaging Review Ct Abdomen Pelvis Wo Contrast  08/05/2014   CLINICAL DATA:  Sepsis.  Leukocytosis.  EXAM: CT ABDOMEN AND PELVIS WITHOUT CONTRAST  TECHNIQUE: Multidetector CT imaging of the abdomen and pelvis was performed following the standard protocol without IV contrast.  COMPARISON:  08/29/2013  FINDINGS: There is a 1.7 cm calculus in the left renal pelvis. There is another 1.7 cm calculus in the lower pole left collecting system. These are new from 08/29/2013. There is moderate left hydronephrosis. There is left perinephric stranding. The left ureter is normal in caliber, with no ureteral calculi.  In the right nephrectomy bed, there is a 2.4 x 4.3 by 3.7 cm complex -appearing collection which could represent residual abscess or residual inflammation. The nephrectomy bed is otherwise unremarkable.  There is cholelithiasis.  There are unremarkable unenhanced appearances of the liver, spleen, pancreas and adrenals.  Bowel is unremarkable.  Uterus and ovaries appear unremarkable.  Abdominal aorta is normal in caliber with mild atherosclerotic calcification.  There is mild right lung base opacity which could be atelectatic or infectious. There are no effusions.  No significant musculoskeletal lesion is evident. Moderately severe degenerative disc and facet changes are present in the lower lumbar spine. There is a bilateral L5 spondylolysis with minimal spondylolisthesis.  IMPRESSION: 1.  New left nephrolithiasis and hydronephrosis. No ureteral calculi are  evident. There is a 1.7 cm left renal pelvic calculus and another 1.7 cm left lower pole collecting system calculus. 2. 2.4 x 3.7 x 4.3 cm right nephrectomy bed collection which could represent residual abscess or inflammation. 3. Cholelithiasis without evidence of an acute process in the biliary system. 4. Mild right lung base opacity due to atelectasis or infection. 5. Incidentally noted spondylolysis and minimal spondylolisthesis at L5. Lower lumbar degenerative disc and facet changes.   Electronically Signed   By: Andreas Newport M.D.   On: 08/05/2014 03:12   Dg Abd 1 View  08/06/2014   CLINICAL DATA:  Decreased output from the left-sided nephrostomy tube ; assess placement  EXAM: ABDOMEN - 1 VIEW  COMPARISON:  None  FINDINGS: A left nephrostomy tube is partially imaged. The tip of the tube overlies the left renal fossa. Its more lateral portion is not included in the field of view.  The bowel gas pattern is unremarkable. There are numerous phleboliths within the pelvis. There are degenerative changes of both hips.  IMPRESSION: The visualized portions of the nephrostomy tube project over the midpole region of the left kidney. Further evaluation with a nephrostogram, CT scanning, or ultrasound could be considered.   Electronically Signed   By: David  Martinique M.D.   On: 08/06/2014 08:53   Ct Head Wo Contrast  08/05/2014   CLINICAL DATA:  Altered mental status. History of multiple sclerosis. Mental status changes began at 19:00.  EXAM: CT HEAD WITHOUT CONTRAST  TECHNIQUE: Contiguous axial images were obtained from the base of the skull through the vertex without intravenous contrast.  COMPARISON:  None.  FINDINGS: There is no intracranial hemorrhage, mass or evidence of acute infarction. There is no extra-axial fluid collection. There is moderate generalized atrophy, greatest in the frontal lobes. There is extensive cerebral white matter hypodensity, greatest in the periventricular regions, nonspecific but  consistent with the described history of long-standing demyelinating disease. No acute intracranial findings are evident. No bony abnormality is evident.  IMPRESSION: Severe white matter hypodensities in both cerebral hemispheres, consistent with the described history of longstanding demyelinating disease. Moderate generalized atrophy, greatest in the frontal lobes. No acute findings are evident.   Electronically Signed   By: Andreas Newport M.D.   On: 08/05/2014 00:41   US Renal  08/05/2014   CLINICAL DATA:  Renal insufficiency.  Right nephrectomy.  EXAM: RENAL / URINARY TRACT ULTRASOUND COMPLETE  COMPARISON:  CT 08/29/2013  FINDINGS: Right Kidney:  Absent.  Prior nephrectomy.  Left Kidney:  Length: 14.6 cm. There is a lower pole left renal calculus with branching morphology suggesting early staghorn formation. No hydronephrosis.  Bladder:  Grossly normal with limited visibility due to low volume.  IMPRESSION: Lower pole left nephrolithiasis with branching morphology suggesting early staghorn. No hydronephrosis.   Electronically Signed   By: Andreas Newport M.D.   On: 08/05/2014 02:52   Dg Chest Port 1 View  08/05/2014   CLINICAL DATA:  Sepsis.  EXAM: PORTABLE CHEST - 1 VIEW  COMPARISON:  02/03/2004  FINDINGS: There is elevation of right hemidiaphragm with adjacent linear opacity, likely atelectasis. Lung volumes are low. Cardiomediastinal contours are normal for technique. No large pleural effusion or pneumothorax. No pulmonary edema.  IMPRESSION: Hypoventilatory chest with elevation of right hemidiaphragm and adjacent linear opacities, likely atelectasis.   Electronically Signed   By: Jeb Levering M.D.   On: 08/05/2014 02:45   Ir Nephrostomy Placement  Left  08/06/2014   CLINICAL DATA:  Left hydronephrosis secondary to obstructing calculus. Previous right nephrectomy for XGPN. Fever.  EXAM: LEFT PERCUTANEOUS NEPHROSTOMY CATHETER PLACEMENT UNDER ULTRASOUND AND FLUOROSCOPIC GUIDANCE  FLUOROSCOPY  TIME:  5 minutes, 66 mGy  TECHNIQUE: The procedure, risks (including but not limited to bleeding, infection, organ damage ), benefits, and alternatives were explained to the patient. Questions regarding the procedure were encouraged and answered. The patient understands and consents to the procedure.  After laterality was marked, the leftFlank region prepped with Betadine, draped in usual sterile fashion, infiltrated locally with 1% lidocaine.Patient was receiving adequate prophylactic antibiotic coverage.  Intravenous Fentanyl and Versed were administered as conscious sedation during continuous cardiorespiratory monitoring by the radiology RN, with a total moderate sedation time of 16 minutes.  Under real-time ultrasound guidance, a 21-gauge trocar needle was advanced into a posterior lower pole calyx. Ultrasound image documentation was saved. Urine spontaneously returned through the needle. Needle was exchanged over a guidewire for transitional dilator. Contrast injection confirmed appropriate positioning. Catheter was exchanged over a guidewire for a 10 French pigtail catheter, formed centrally within the left renal collecting system. Contrast injection confirms appropriate positioning and patency. Catheter secured externally with 0 Prolene suture and placed to external drain bag.  COMPLICATIONS: COMPLICATIONS none  IMPRESSION: 1. Technically successful left percutaneous nephrostomy catheter placement.   Electronically Signed   By: Lucrezia Europe M.D.   On: 08/06/2014 12:40     EKG Interpretation   Date/Time:  Sunday August 04 2014 23:12:50 EDT Ventricular Rate:  103 PR Interval:  146 QRS Duration: 88 QT Interval:  349 QTC Calculation: 457 R Axis:   -22 Text Interpretation:  Sinus tachycardia Abnormal R-wave progression, early  transition Left ventricular hypertrophy ECG not diagnostic for Acute  Coronary Syndrome; consider clinical findings No old tracing to compare  Confirmed by Kathrynn Humble, MD, Thelma Comp  [phone]) on 08/04/2014 11:30:12 PM      MDM   Final diagnoses:  Pyelonephritis  Somnolence  AKI (acute kidney injury)  Severe Sepsis  DDx includes: ICH Stroke ACS Sepsis syndrome Infection - UTI/Pneumonia Electrolyte abnormality Drug overdose DKA Metabolic disorders including thyroid disorders, adrenal insufficiency  Pt comes in with cc of AMS. Hx of MS, on immuno supressive Therapy, and hx of renal infection s/p nephrectomy. She had acute decline in her mentation today. No falls. Urine appears to be infection - so initial impression is that pt has urosepsis/pyelonephritis. She has 2 SIRs at arrival - so will get sepsis workup initiated. Will get CT head and Ct abd if the urine is clear.   8:19 PM [place] ordered to ensure there was no hydronpehrosis or perinephric abscess - admitting team would prefer CT scan based on the better clarity of the source of infection or complication of the uti - antibiotics started. PT admitted.  Varney Biles, MD 08/06/14 2020

## 2014-08-05 NOTE — Progress Notes (Signed)
Initial Nutrition Assessment  DOCUMENTATION CODES:  Not applicable  INTERVENTION: - RD will monitor for needs with diet advancement  NUTRITION DIAGNOSIS:  Inadequate oral intake related to inability to eat as evidenced by NPO status.  GOAL:  Patient will meet greater than or equal to 90% of their needs  MONITOR:  Diet advancement, Weight trends, Labs, I & O's  REASON FOR ASSESSMENT:  Low Braden  ASSESSMENT: 44 y.o. female with history of multiple sclerosis was brought to the ER after patient was found to be increasingly somnolent and difficult to arouse. As per the family patient was doing fine until 7 PM last night when patient became somnolent and became increasingly difficult to arouse. Patient was brought to the ER and was found to be febrile and on the responding to deep sternal rub. As per the family patient was not started on any new medications or did not miss any of her medications. Patient did not have any nausea vomiting abdominal pain diarrhea chest pain shortness of breath productive cough prior to this episode.  Pt seen for low Braden. BMI indicates overweight status. Pt has been NPO since admission and unable to meet needs. Physical assessment does not show muscle or fat wasting.   Pt unable to provide information; all information provided by family member in the room. PTA, pt had a very good appetite eating meals and snacks daily. Her weight has remained stable, per his report.  Medications reviewed. Labs reviewed; BUN/creatinine elevated, Ca: 8.5 mg/dL, GFR: 15.  Height:  Ht Readings from Last 1 Encounters:  08/05/14 5\' 10"  (1.778 m)    Weight:  Wt Readings from Last 1 Encounters:  08/05/14 197 lb 15.6 oz (89.8 kg)    Ideal Body Weight:  78.2 kg (kg)  Wt Readings from Last 10 Encounters:  08/05/14 197 lb 15.6 oz (89.8 kg)  10/05/13 200 lb (90.719 kg)  08/05/13 187 lb 4.8 oz (84.959 kg)    BMI:  Body mass index is 28.41 kg/(m^2).  Estimated  Nutritional Needs:  Kcal:  1660-6004  Protein:  85-95 grams  Fluid:  2.2 L/day  Skin:  Reviewed, no issues  Diet Order:  Diet NPO time specified  EDUCATION NEEDS:  No education needs identified at this time   Intake/Output Summary (Last 24 hours) at 08/05/14 0939 Last data filed at 08/05/14 0800  Gross per 24 hour  Intake 2892.5 ml  Output      0 ml  Net 2892.5 ml    Last BM:  PTA    Trenton Gammon, RD, LDN Inpatient Clinical Dietitian Pager # [phone] After hours/weekend pager # [phone]

## 2014-08-06 ENCOUNTER — Inpatient Hospital Stay (HOSPITAL_COMMUNITY): Payer: Medicare Other

## 2014-08-06 DIAGNOSIS — R7881 Bacteremia: Secondary | ICD-10-CM

## 2014-08-06 LAB — CBC
HEMATOCRIT: 32.2 % — AB (ref 36.0–46.0)
Hemoglobin: 10.4 g/dL — ABNORMAL LOW (ref 12.0–15.0)
MCH: 28.4 pg (ref 26.0–34.0)
MCHC: 32.3 g/dL (ref 30.0–36.0)
MCV: 88 fL (ref 78.0–100.0)
PLATELETS: 232 10*3/uL (ref 150–400)
RBC: 3.66 MIL/uL — ABNORMAL LOW (ref 3.87–5.11)
RDW: 17.3 % — ABNORMAL HIGH (ref 11.5–15.5)
WBC: 7 10*3/uL (ref 4.0–10.5)

## 2014-08-06 LAB — COMPREHENSIVE METABOLIC PANEL
ALBUMIN: 2.8 g/dL — AB (ref 3.5–5.0)
ALT: 16 U/L (ref 14–54)
ANION GAP: 9 (ref 5–15)
AST: 17 U/L (ref 15–41)
Alkaline Phosphatase: 52 U/L (ref 38–126)
BUN: 19 mg/dL (ref 6–20)
CO2: 20 mmol/L — ABNORMAL LOW (ref 22–32)
Calcium: 8.5 mg/dL — ABNORMAL LOW (ref 8.9–10.3)
Chloride: 113 mmol/L — ABNORMAL HIGH (ref 101–111)
Creatinine, Ser: 2.14 mg/dL — ABNORMAL HIGH (ref 0.44–1.00)
GFR calc Af Amer: 31 mL/min — ABNORMAL LOW (ref >60)
GFR calc non Af Amer: 27 mL/min — ABNORMAL LOW (ref >60)
Glucose, Bld: 161 mg/dL — ABNORMAL HIGH (ref 65–99)
POTASSIUM: 3.8 mmol/L (ref 3.5–5.1)
SODIUM: 142 mmol/L (ref 135–145)
TOTAL PROTEIN: 7.3 g/dL (ref 6.5–8.1)
Total Bilirubin: 1.1 mg/dL (ref 0.3–1.2)

## 2014-08-06 LAB — GLUCOSE, CAPILLARY
GLUCOSE-CAPILLARY: 131 mg/dL — AB (ref 65–99)
Glucose-Capillary: 109 mg/dL — ABNORMAL HIGH (ref 65–99)
Glucose-Capillary: 106 mg/dL — ABNORMAL HIGH (ref 65–99)
Glucose-Capillary: 131 mg/dL — ABNORMAL HIGH (ref 65–99)
Glucose-Capillary: 135 mg/dL — ABNORMAL HIGH (ref 65–99)
Glucose-Capillary: 99 mg/dL (ref 65–99)

## 2014-08-06 LAB — URINE CULTURE: Culture: NO GROWTH

## 2014-08-06 NOTE — Psychosocial Assessment (Signed)
Received report on patient from ICU. Pt with two day history of AMS, febrile and uti. Pt had ct of abdomen which showed 17 mm stone to left UPJ and under went a left nephrostomy tube placement. Pt improved over the next 24 hrs and was transferred to floor today. On arrival to floor, pt is awake and alert. Mom and fiancee at bedside. Pt has no complaints of pain. Left nephrostomy site intact, dressing in place, no bleeding noted, leg bag with approximately 15 ml of urine-cloudy with specks of blood. Pt denies pain at site. Pt also has foley in draining well secured to right thigh and standard drainage bag. No obvious skin break down noted. Pt lives at home with mom who takes care of her. Pt placed on cardiac monitor.

## 2014-08-06 NOTE — Progress Notes (Signed)
CRITICAL VALUE ALERT  Critical value received:  BC + aerobic gram + clusters  Date of notification:  08/06/2014  Time of notification:  0135  Critical value read back:Yes.    Nurse who received alert:  mr  MD notified (1st page):  schore  Time of first page:  0140  MD notified (2nd page):  Time of second page:  Responding MD:  na  Time MD responded:  Na Already on vanc and zosyn

## 2014-08-06 NOTE — Progress Notes (Signed)
Critical Lab value of positive blood cultures paged to Dr. Cena Benton. Gram positive Cocci in Clusters only in Aerobic bottle. Page not returned.

## 2014-08-06 NOTE — Clinical Documentation Improvement (Addendum)
Patient has multiple sclerosis per current medical record.  Patient is noted to be bed bound, assisted to wheelchair by hoyer lift, unable to move her arms and legs, unable to feed herself and is incontinent and wears Depends.  Please document if a condition below provides greater specificity regarding the patient's functional status:   - Functional Quadriplegia 2/2 ....................................  - Other condition  - Unable to clinically determine     Thank You, Jerral Ralph ,RN Clinical Documentation Specialist [phone]  [facility]- Health Information Management

## 2014-08-06 NOTE — Progress Notes (Signed)
Patient ID: Jacqueline Norman, female   DOB: [birth year], 44 y.o.   MRN: [id number]    Referring Physician(s): Dahlstedt,S  Subjective:  Pt much more alert today, talkative; denies sig flank pain, N/V  Allergies: Review of patient's allergies indicates no known allergies.  Medications: Prior to Admission medications   Medication Sig Start Date End Date Taking? Authorizing Provider  baclofen (LIORESAL) 10 MG tablet Take 10 mg by mouth 2 (two) times daily.   Yes Historical Provider, MD  benztropine (COGENTIN) 1 MG tablet Take 1 mg by mouth 2 (two) times daily.   Yes Historical Provider, MD  cholecalciferol (VITAMIN D) 1000 UNITS tablet Take 1,000 Units by mouth every morning.   Yes Historical Provider, MD  docusate sodium (COLACE) 100 MG capsule Take 100 mg by mouth daily as needed for mild constipation.   Yes Historical Provider, MD  ferrous sulfate 325 (65 FE) MG tablet Take 1 tablet (325 mg total) by mouth 3 (three) times daily with meals. 08/05/13  Yes Belkys A Regalado, MD  Interferon Beta-1b (BETASERON/EXTAVIA) 0.3 MG KIT injection Inject 0.3 mg into the skin every other day.   Yes Historical Provider, MD  levothyroxine (SYNTHROID, LEVOTHROID) 50 MCG tablet Take 50 mcg by mouth daily before breakfast.   Yes Historical Provider, MD  omega-3 acid ethyl esters (LOVAZA) 1 G capsule Take 1 g by mouth 2 (two) times daily.    Yes Historical Provider, MD  potassium chloride SA (K-DUR,KLOR-CON) 20 MEQ tablet Take 20 mEq by mouth every morning.   Yes Historical Provider, MD  simvastatin (ZOCOR) 5 MG tablet Take 5 mg by mouth at bedtime.    Yes Historical Provider, MD  ciprofloxacin (CIPRO) 500 MG tablet Take 1 tablet (500 mg total) by mouth 2 (two) times daily. X 7 days to prevent post-op infection 10/08/13   Alexis Frock, MD  oxyCODONE-acetaminophen (PERCOCET/ROXICET) 5-325 MG per tablet Take 1-2 tablets by mouth every 6 (six) hours as needed for moderate pain. Post-operatively Patient not taking:  Reported on 08/05/2014 10/08/13   Alexis Frock, MD     Vital Signs: BP 123/84 mmHg  Pulse 87  Temp(Src) 98.5 F (36.9 C) (Oral)  Resp 13  Ht 5' 10"  (1.778 m)  Wt 197 lb 5 oz (89.5 kg)  BMI 28.31 kg/m2  SpO2 98%  LMP   Physical Exam left PCN intact, insertion site ok, NT, output 420 cc's light yellow urine  Imaging: Ct Abdomen Pelvis Wo Contrast  08/05/2014   CLINICAL DATA:  Sepsis.  Leukocytosis.  EXAM: CT ABDOMEN AND PELVIS WITHOUT CONTRAST  TECHNIQUE: Multidetector CT imaging of the abdomen and pelvis was performed following the standard protocol without IV contrast.  COMPARISON:  08/29/2013  FINDINGS: There is a 1.7 cm calculus in the left renal pelvis. There is another 1.7 cm calculus in the lower pole left collecting system. These are new from 08/29/2013. There is moderate left hydronephrosis. There is left perinephric stranding. The left ureter is normal in caliber, with no ureteral calculi.  In the right nephrectomy bed, there is a 2.4 x 4.3 by 3.7 cm complex -appearing collection which could represent residual abscess or residual inflammation. The nephrectomy bed is otherwise unremarkable.  There is cholelithiasis.  There are unremarkable unenhanced appearances of the liver, spleen, pancreas and adrenals.  Bowel is unremarkable.  Uterus and ovaries appear unremarkable.  Abdominal aorta is normal in caliber with mild atherosclerotic calcification.  There is mild right lung base opacity which could be atelectatic or infectious.  There are no effusions.  No significant musculoskeletal lesion is evident. Moderately severe degenerative disc and facet changes are present in the lower lumbar spine. There is a bilateral L5 spondylolysis with minimal spondylolisthesis.  IMPRESSION: 1. New left nephrolithiasis and hydronephrosis. No ureteral calculi are evident. There is a 1.7 cm left renal pelvic calculus and another 1.7 cm left lower pole collecting system calculus. 2. 2.4 x 3.7 x 4.3 cm right  nephrectomy bed collection which could represent residual abscess or inflammation. 3. Cholelithiasis without evidence of Jacqueline acute process in the biliary system. 4. Mild right lung base opacity due to atelectasis or infection. 5. Incidentally noted spondylolysis and minimal spondylolisthesis at L5. Lower lumbar degenerative disc and facet changes.   Electronically Signed   By: Andreas Newport M.D.   On: 08/05/2014 03:12   Dg Abd 1 View  08/06/2014   CLINICAL DATA:  Decreased output from the left-sided nephrostomy tube ; assess placement  EXAM: ABDOMEN - 1 VIEW  COMPARISON:  None  FINDINGS: A left nephrostomy tube is partially imaged. The tip of the tube overlies the left renal fossa. Its more lateral portion is not included in the field of view.  The bowel gas pattern is unremarkable. There are numerous phleboliths within the pelvis. There are degenerative changes of both hips.  IMPRESSION: The visualized portions of the nephrostomy tube project over the midpole region of the left kidney. Further evaluation with a nephrostogram, CT scanning, or ultrasound could be considered.   Electronically Signed   By: David  Martinique M.D.   On: 08/06/2014 08:53   Ct Head Wo Contrast  08/05/2014   CLINICAL DATA:  Altered mental status. History of multiple sclerosis. Mental status changes began at 19:00.  EXAM: CT HEAD WITHOUT CONTRAST  TECHNIQUE: Contiguous axial images were obtained from the base of the skull through the vertex without intravenous contrast.  COMPARISON:  None.  FINDINGS: There is no intracranial hemorrhage, mass or evidence of acute infarction. There is no extra-axial fluid collection. There is moderate generalized atrophy, greatest in the frontal lobes. There is extensive cerebral white matter hypodensity, greatest in the periventricular regions, nonspecific but consistent with the described history of long-standing demyelinating disease. No acute intracranial findings are evident. No bony abnormality is  evident.  IMPRESSION: Severe white matter hypodensities in both cerebral hemispheres, consistent with the described history of longstanding demyelinating disease. Moderate generalized atrophy, greatest in the frontal lobes. No acute findings are evident.   Electronically Signed   By: Andreas Newport M.D.   On: 08/05/2014 00:41   US Renal  08/05/2014   CLINICAL DATA:  Renal insufficiency.  Right nephrectomy.  EXAM: RENAL / URINARY TRACT ULTRASOUND COMPLETE  COMPARISON:  CT 08/29/2013  FINDINGS: Right Kidney:  Absent.  Prior nephrectomy.  Left Kidney:  Length: 14.6 cm. There is a lower pole left renal calculus with branching morphology suggesting early staghorn formation. No hydronephrosis.  Bladder:  Grossly normal with limited visibility due to low volume.  IMPRESSION: Lower pole left nephrolithiasis with branching morphology suggesting early staghorn. No hydronephrosis.   Electronically Signed   By: Andreas Newport M.D.   On: 08/05/2014 02:52   Dg Chest Port 1 View  08/05/2014   CLINICAL DATA:  Sepsis.  EXAM: PORTABLE CHEST - 1 VIEW  COMPARISON:  02/03/2004  FINDINGS: There is elevation of right hemidiaphragm with adjacent linear opacity, likely atelectasis. Lung volumes are low. Cardiomediastinal contours are normal for technique. No large pleural effusion or pneumothorax. No pulmonary  edema.  IMPRESSION: Hypoventilatory chest with elevation of right hemidiaphragm and adjacent linear opacities, likely atelectasis.   Electronically Signed   By: Jeb Levering M.D.   On: 08/05/2014 02:45    Labs:  CBC:  Recent Labs  10/07/13 0816 08/04/14 2327 08/05/14 0500 08/06/14 0810  WBC 8.2 11.1* 9.6 7.0  HGB 9.7* 12.4 11.4* 10.4*  HCT 31.4* 37.7 35.9* 32.2*  PLT 220 257 236 232    COAGS:  Recent Labs  08/05/14 0500  INR 1.19  APTT 31    BMP:  Recent Labs  10/07/13 0816 08/04/14 2327 08/05/14 0500 08/06/14 0810  NA 136* 139 138 142  K 3.3* 4.7 4.5 3.8  CL 102 108 109 113*  CO2  24 23 19* 20*  GLUCOSE 110* 133* 174* 161*  BUN 3* 25* 27* 19  CALCIUM 8.6 9.5 8.5* 8.5*  CREATININE 0.75 3.71* 3.98* 2.14*  GFRNONAA >90 14* 13* 27*  GFRAA >90 16* 15* 31*    LIVER FUNCTION TESTS:  Recent Labs  08/04/14 2327 08/05/14 0500 08/06/14 0810  BILITOT 0.9 1.2 1.1  AST 21 18 17   ALT 18 15 16   ALKPHOS 54 49 52  PROT 7.8 7.2 7.3  ALBUMIN 3.5 3.0* 2.8*    Assessment and Plan: S/p left PCN 6/28 secondary to obstructive hydronephrosis/stone/sepsis/ARF; afebrile; WBC nl; hgb 10.4; creat 2.14(3.98);  gm pos cocci on blood gram stain - check final results; cont current tx/antbx for now; monitor labs; abd film shows left PCN over mid pole region ; if output of PCN diminishes can do nephrostogram if needed; hydrate. Other plans as per urology/IM.    Signed: D. Rowe Robert 08/06/2014, 10:10 AM   I spent a total of 15 minutes in face to face in clinical consultation/evaluation, greater than 50% of which was counseling/coordinating care for left perc nephrostomy

## 2014-08-06 NOTE — Progress Notes (Signed)
Echocardiogram 2D Echocardiogram has been performed.  Jacqueline Norman 08/06/2014, 1:39 PM

## 2014-08-06 NOTE — Progress Notes (Signed)
[facility] PROGRESS NOTE  SHARIE AMORIN ZOX:[id number] DOB: May 16, 1970 DOA: 08/04/2014 PCP: Florentina Jenny, MD  Assessment/Plan: Principal Problem:   Acute encephalopathy -Multifactorial most likely related to recent infectious etiology in patient with history of MS and acute renal failure. 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 Who presented to the hospital with 17 mm left UPJ stone. IR consulted for left percutaneous nephrostomy - Serum creatinine trending down    Hydronephrosis with renal and ureteral calculus obstruction - Urology on board. Please see above  Code Status: full Family Communication: discussed with fiancee  Disposition Plan: Pending improvement in condition.   Consultants:  Urology  IR  Procedures: left percutaneous nephrostomy  Antibiotics:  Vanc and Zosyn  HPI/Subjective: Pt is more responsive than yesterday. Still confused  Objective: Filed Vitals:   08/06/14 1221  BP: 129/70  Pulse: 93  Temp: 98.1 F (36.7 C)  Resp: 18    Intake/Output Summary (Last 24 hours) at 08/06/14 1226 Last data filed at 08/06/14 1100  Gross per 24 hour  Intake   2760 ml  Output    970 ml  Net   1790 ml   Filed Weights   08/05/14 0330 08/06/14 0600 08/06/14 1221  Weight: 89.8 kg (197 lb 15.6 oz) 89.5 kg (197 lb 5 oz) 89.1 kg (196 lb 6.9 oz)    Exam:   General:  Pt in nad, alert and awake not oriented  Cardiovascular: rrr, no mrg  Respiratory: cta bl, no wheezes  Abdomen: soft,  NT, ND  Musculoskeletal: no cyanosis or clubbing   Data Reviewed: Basic Metabolic Panel:  Recent Labs Lab 08/04/14 2327 08/05/14 0500 08/06/14 0810  NA 139 138 142  K 4.7 4.5 3.8  CL 108 109 113*  CO2 23 19* 20*  GLUCOSE 133* 174* 161*  BUN 25* 27* 19  CREATININE 3.71* 3.98* 2.14*  CALCIUM 9.5 8.5* 8.5*   Liver Function Tests:  Recent Labs Lab 08/04/14 2327 08/05/14 0500 08/06/14 0810  AST 21 18 17   ALT 18 15 16   ALKPHOS 54 49 52  BILITOT 0.9 1.2 1.1  PROT 7.8 7.2 7.3  ALBUMIN 3.5 3.0* 2.8*   No results for input(s): LIPASE, AMYLASE in the last 168 hours. No results for input(s): AMMONIA in the last 168 hours. CBC:  Recent Labs Lab 08/04/14 2327 08/05/14 0500 08/06/14 0810  WBC 11.1* 9.6 7.0  NEUTROABS  --  7.4  --   HGB 12.4 11.4* 10.4*  HCT 37.7 35.9* 32.2*  MCV 86.5 88.0 88.0  PLT 257 236 232   Cardiac Enzymes: No results for input(s): CKTOTAL, CKMB, CKMBINDEX, TROPONINI in the last 168 hours. BNP (last 3 results) No results for input(s): BNP in the last 8760 hours.  ProBNP (last 3 results) No results for input(s): PROBNP in the last 8760 hours.  CBG:  Recent Labs Lab 08/05/14 1937 08/05/14 2329 08/06/14 0432 08/06/14 0716 08/06/14 1137  GLUCAP 112* 109* 106* 131* 131*    Recent Results (from the past 240 hour(s))  Urine culture     Status: None (Preliminary result)  Collection Time: 08/04/14 11:28 PM  Result Value Ref Range Status   Specimen Description URINE, CLEAN CATCH  Final   Special Requests NONE  Final   Culture   Final    >=100,000 COLONIES/mL GRAM NEGATIVE RODS Performed at [facility]    Report Status PENDING  Incomplete  Blood Culture (routine x 2)     Status: None (Preliminary result)   Collection Time: 08/05/14 12:33 AM  Result Value Ref Range Status   Specimen Description LEFT ANTECUBITAL  Final   Special Requests BOTTLES DRAWN AEROBIC AND ANAEROBIC  Final   Culture  Setup Time   Final    GRAM POSITIVE  COCCI IN CLUSTERS IN BOTH AEROBIC AND ANAEROBIC BOTTLES CRITICAL RESULT CALLED TO, READ BACK BY AND VERIFIED WITH: M. REEVES RN [phone] GREEN R CONFIRMED BY R. HOLMES Performed at [facility]    Culture PENDING  Incomplete   Report Status PENDING  Incomplete  Blood Culture (routine x 2)     Status: None (Preliminary result)   Collection Time: 08/05/14 12:33 AM  Result Value Ref Range Status   Specimen Description BLOOD RIGHT HAND  Final   Special Requests BOTTLES DRAWN AEROBIC AND ANAEROBIC  Final   Culture PENDING  Incomplete   Report Status PENDING  Incomplete  MRSA PCR Screening     Status: None   Collection Time: 08/05/14  4:18 AM  Result Value Ref Range Status   MRSA by PCR NEGATIVE NEGATIVE Final    Comment:        The GeneXpert MRSA Assay (FDA approved for NASAL specimens only), is one component of a comprehensive MRSA colonization surveillance program. It is not intended to diagnose MRSA infection nor to guide or monitor treatment for MRSA infections.      Studies: Ct Abdomen Pelvis Wo Contrast  08/05/2014   CLINICAL DATA:  Sepsis.  Leukocytosis.  EXAM: CT ABDOMEN AND PELVIS WITHOUT CONTRAST  TECHNIQUE: Multidetector CT imaging of the abdomen and pelvis was performed following the standard protocol without IV contrast.  COMPARISON:  08/29/2013  FINDINGS: There is a 1.7 cm calculus in the left renal pelvis. There is another 1.7 cm calculus in the lower pole left collecting system. These are new from 08/29/2013. There is moderate left hydronephrosis. There is left perinephric stranding. The left ureter is normal in caliber, with no ureteral calculi.  In the right nephrectomy bed, there is a 2.4 x 4.3 by 3.7 cm complex -appearing collection which could represent residual abscess or residual inflammation. The nephrectomy bed is otherwise unremarkable.  There is cholelithiasis.  There are unremarkable unenhanced appearances of the liver, spleen, pancreas and adrenals.   Bowel is unremarkable.  Uterus and ovaries appear unremarkable.  Abdominal aorta is normal in caliber with mild atherosclerotic calcification.  There is mild right lung base opacity which could be atelectatic or infectious. There are no effusions.  No significant musculoskeletal lesion is evident. Moderately severe degenerative disc and facet changes are present in the lower lumbar spine. There is a bilateral L5 spondylolysis with minimal spondylolisthesis.  IMPRESSION: 1. New left nephrolithiasis and hydronephrosis. No ureteral calculi are evident. There is a 1.7 cm left renal pelvic calculus and another 1.7 cm left lower pole collecting system calculus. 2. 2.4 x 3.7 x 4.3 cm right nephrectomy bed collection which could represent residual abscess or inflammation. 3. Cholelithiasis without evidence of an acute process in the biliary system. 4. Mild right lung base opacity due to atelectasis or infection.  5. Incidentally noted spondylolysis and minimal spondylolisthesis at L5. Lower lumbar degenerative disc and facet changes.   Electronically Signed   By: Ellery Plunk M.D.   On: 08/05/2014 03:12   Dg Abd 1 View  08/06/2014   CLINICAL DATA:  Decreased output from the left-sided nephrostomy tube ; assess placement  EXAM: ABDOMEN - 1 VIEW  COMPARISON:  None  FINDINGS: A left nephrostomy tube is partially imaged. The tip of the tube overlies the left renal fossa. Its more lateral portion is not included in the field of view.  The bowel gas pattern is unremarkable. There are numerous phleboliths within the pelvis. There are degenerative changes of both hips.  IMPRESSION: The visualized portions of the nephrostomy tube project over the midpole region of the left kidney. Further evaluation with a nephrostogram, CT scanning, or ultrasound could be considered.   Electronically Signed   By: David  [place] M.D.   On: 08/06/2014 08:53   Ct Head Wo Contrast  08/05/2014   CLINICAL DATA:  Altered mental status. History  of multiple sclerosis. Mental status changes began at 19:00.  EXAM: CT HEAD WITHOUT CONTRAST  TECHNIQUE: Contiguous axial images were obtained from the base of the skull through the vertex without intravenous contrast.  COMPARISON:  None.  FINDINGS: There is no intracranial hemorrhage, mass or evidence of acute infarction. There is no extra-axial fluid collection. There is moderate generalized atrophy, greatest in the frontal lobes. There is extensive cerebral white matter hypodensity, greatest in the periventricular regions, nonspecific but consistent with the described history of long-standing demyelinating disease. No acute intracranial findings are evident. No bony abnormality is evident.  IMPRESSION: Severe white matter hypodensities in both cerebral hemispheres, consistent with the described history of longstanding demyelinating disease. Moderate generalized atrophy, greatest in the frontal lobes. No acute findings are evident.   Electronically Signed   By: Ellery Plunk M.D.   On: 08/05/2014 00:41   US Renal  08/05/2014   CLINICAL DATA:  Renal insufficiency.  Right nephrectomy.  EXAM: RENAL / URINARY TRACT ULTRASOUND COMPLETE  COMPARISON:  CT 08/29/2013  FINDINGS: Right Kidney:  Absent.  Prior nephrectomy.  Left Kidney:  Length: 14.6 cm. There is a lower pole left renal calculus with branching morphology suggesting early staghorn formation. No hydronephrosis.  Bladder:  Grossly normal with limited visibility due to low volume.  IMPRESSION: Lower pole left nephrolithiasis with branching morphology suggesting early staghorn. No hydronephrosis.   Electronically Signed   By: Ellery Plunk M.D.   On: 08/05/2014 02:52   Dg Chest Port 1 View  08/05/2014   CLINICAL DATA:  Sepsis.  EXAM: PORTABLE CHEST - 1 VIEW  COMPARISON:  02/03/2004  FINDINGS: There is elevation of right hemidiaphragm with adjacent linear opacity, likely atelectasis. Lung volumes are low. Cardiomediastinal contours are normal for  technique. No large pleural effusion or pneumothorax. No pulmonary edema.  IMPRESSION: Hypoventilatory chest with elevation of right hemidiaphragm and adjacent linear opacities, likely atelectasis.   Electronically Signed   By: Rubye Oaks M.D.   On: 08/05/2014 02:45    Scheduled Meds: . antiseptic oral rinse  7 mL Mouth Rinse BID  . levothyroxine  25 mcg Intravenous Daily  . piperacillin-tazobactam (ZOSYN)  IV  3.375 g Intravenous 3 times per day  . vancomycin  1,000 mg Intravenous Q24H   Continuous Infusions:    Time spent: > 35 minutes    Penny Pia  [facility] Pager [phone] If 7PM-7AM, please contact night-coverage at [email or web address], password  [email or web address] 08/06/2014, 12:26 PM  LOS: 1 day

## 2014-08-06 NOTE — Progress Notes (Signed)
Subjective:  1 - Acute Renal Failure / Obstruction of Solitary Left Kidney - CR 3.98 on admit 6/26 up from baseline <1 with hdro of solitary kidney from large volume stone. Now s/p neph tube 6/27. 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   2 - Urosepsis - fever, tachycardia, bacteruria on presentationthis admission c/w likely urosepsis. BCX, UCX 6/26 pending. On empiric Vanc + Zosyn.  3 - Large Left Renal Stone - multifocal left renal stone, total volume about 2.5 cm by CT 07/2014. This is new since last imaging 2015 at time of right nephrectomy.   Today Jacqueline Norman is improved somewhat. Speaking some / less obtunded. Copious UOP per urethra but not much form neph tube which does flush quantitiatively.   Objective: Vital signs in last 24 hours: Temp:  [98.3 F (36.8 C)-100.1 F (37.8 C)] 99 F (37.2 C) (06/28 0400) Pulse Rate:  [79-116] 79 (06/28 0400) Resp:  [11-17] 12 (06/28 0400) BP: (107-149)/(65-95) 139/73 mmHg (06/28 0400) SpO2:  [97 %-100 %] 100 % (06/28 0400) Last BM Date:  (PTA)  Intake/Output from previous day: 06/27 0701 - 06/28 0700 In: 3255 [I.V.:3150; IV Piggyback:100] Out: 400 [Urine:400] Intake/Output this shift: Total I/O In: 1555 [I.V.:1500; Other:5; IV Piggyback:50] Out: 0   General appearance: cooperative, morbidly obese and UE contractures. AOx0. says few words. Family at bedside.  Head: Normocephalic, without obvious abnormality, atraumatic Back: symmetric, no curvature. ROM normal. No CVA tenderness. Resp: non-labored on [place] Two O2 Cardio: Nl rate by bedside monitor Pelvic: external genitalia normal Extremities: extremities normal, atraumatic, no cyanosis or edema Pulses: 2+ and symmetric Neurologic: Grossly normal Incision/Wound: remote abd inicision sites w/o hernias. Left neph tube with scant yellow urine, flushes quantitiatively.   Lab Results:   Recent Labs  08/04/14 2327  08/05/14 0500  WBC 11.1* 9.6  HGB 12.4 11.4*  HCT 37.7 35.9*  PLT 257 236   BMET  Recent Labs  08/04/14 2327 08/05/14 0500  NA 139 138  K 4.7 4.5  CL 108 109  CO2 23 19*  GLUCOSE 133* 174*  BUN 25* 27*  CREATININE 3.71* 3.98*  CALCIUM 9.5 8.5*   PT/INR  Recent Labs  08/05/14 0500  LABPROT 15.3*  INR 1.19   ABG  Recent Labs  08/05/14 0212  PHART 7.380  HCO3 17.9*    Studies/Results: Ct Abdomen Pelvis Wo Contrast  08/05/2014   CLINICAL DATA:  Sepsis.  Leukocytosis.  EXAM: CT ABDOMEN AND PELVIS WITHOUT CONTRAST  TECHNIQUE: Multidetector CT imaging of the abdomen and pelvis was performed following the standard protocol without IV contrast.  COMPARISON:  08/29/2013  FINDINGS: There is a 1.7 cm calculus in the left renal pelvis. There is another 1.7 cm calculus in the lower pole left collecting system. These are new from 08/29/2013. There is moderate left hydronephrosis. There is left perinephric stranding. The left ureter is normal in caliber, with no ureteral calculi.  In the right nephrectomy bed, there is a 2.4 x 4.3 by 3.7 cm complex -appearing collection which could represent residual abscess or residual inflammation. The nephrectomy bed is otherwise unremarkable.  There is cholelithiasis.  There are unremarkable unenhanced appearances of the liver, spleen, pancreas and adrenals.  Bowel is unremarkable.  Uterus and ovaries appear unremarkable.  Abdominal aorta is normal in caliber with mild atherosclerotic calcification.  There is mild right lung base opacity which could be atelectatic or infectious. There are no effusions.  No significant musculoskeletal lesion is  evident. Moderately severe degenerative disc and facet changes are present in the lower lumbar spine. There is a bilateral L5 spondylolysis with minimal spondylolisthesis.  IMPRESSION: 1. New left nephrolithiasis and hydronephrosis. No ureteral calculi are evident. There is a 1.7 cm left renal pelvic calculus and  another 1.7 cm left lower pole collecting system calculus. 2. 2.4 x 3.7 x 4.3 cm right nephrectomy bed collection which could represent residual abscess or inflammation. 3. Cholelithiasis without evidence of an acute process in the biliary system. 4. Mild right lung base opacity due to atelectasis or infection. 5. Incidentally noted spondylolysis and minimal spondylolisthesis at L5. Lower lumbar degenerative disc and facet changes.   Electronically Signed   By: Ellery Plunk M.D.   On: 08/05/2014 03:12   Ct Head Wo Contrast  08/05/2014   CLINICAL DATA:  Altered mental status. History of multiple sclerosis. Mental status changes began at 19:00.  EXAM: CT HEAD WITHOUT CONTRAST  TECHNIQUE: Contiguous axial images were obtained from the base of the skull through the vertex without intravenous contrast.  COMPARISON:  None.  FINDINGS: There is no intracranial hemorrhage, mass or evidence of acute infarction. There is no extra-axial fluid collection. There is moderate generalized atrophy, greatest in the frontal lobes. There is extensive cerebral white matter hypodensity, greatest in the periventricular regions, nonspecific but consistent with the described history of long-standing demyelinating disease. No acute intracranial findings are evident. No bony abnormality is evident.  IMPRESSION: Severe white matter hypodensities in both cerebral hemispheres, consistent with the described history of longstanding demyelinating disease. Moderate generalized atrophy, greatest in the frontal lobes. No acute findings are evident.   Electronically Signed   By: Ellery Plunk M.D.   On: 08/05/2014 00:41   US Renal  08/05/2014   CLINICAL DATA:  Renal insufficiency.  Right nephrectomy.  EXAM: RENAL / URINARY TRACT ULTRASOUND COMPLETE  COMPARISON:  CT 08/29/2013  FINDINGS: Right Kidney:  Absent.  Prior nephrectomy.  Left Kidney:  Length: 14.6 cm. There is a lower pole left renal calculus with branching morphology suggesting  early staghorn formation. No hydronephrosis.  Bladder:  Grossly normal with limited visibility due to low volume.  IMPRESSION: Lower pole left nephrolithiasis with branching morphology suggesting early staghorn. No hydronephrosis.   Electronically Signed   By: Ellery Plunk M.D.   On: 08/05/2014 02:52   Dg Chest Port 1 View  08/05/2014   CLINICAL DATA:  Sepsis.  EXAM: PORTABLE CHEST - 1 VIEW  COMPARISON:  02/03/2004  FINDINGS: There is elevation of right hemidiaphragm with adjacent linear opacity, likely atelectasis. Lung volumes are low. Cardiomediastinal contours are normal for technique. No large pleural effusion or pneumothorax. No pulmonary edema.  IMPRESSION: Hypoventilatory chest with elevation of right hemidiaphragm and adjacent linear opacities, likely atelectasis.   Electronically Signed   By: Rubye Oaks M.D.   On: 08/05/2014 02:45    Anti-infectives: Anti-infectives    Start     Dose/Rate Route Frequency Ordered Stop   08/06/14 0600  vancomycin (VANCOCIN) IVPB 1000 mg/200 mL premix     1,000 mg 200 mL/hr over 60 Minutes Intravenous Every 24 hours 08/05/14 0420     08/05/14 0345  piperacillin-tazobactam (ZOSYN) IVPB 3.375 g     3.375 g 12.5 mL/hr over 240 Minutes Intravenous 3 times per day 08/05/14 0343     08/05/14 0345  vancomycin (VANCOCIN) IVPB 1000 mg/200 mL premix     1,000 mg 200 mL/hr over 60 Minutes Intravenous  Once 08/05/14 0343 08/05/14 0537  08/05/14 0130  cefTRIAXone (ROCEPHIN) 1 g in dextrose 5 % 50 mL IVPB     1 g 100 mL/hr over 30 Minutes Intravenous  Once 08/05/14 0116 08/05/14 0313      Assessment/Plan:  1 - Acute Renal Failure / Obstruction of Solitary Left Kidney - now s/p neph tube. Reinforced ton NSG importance af accurate UOP monitoring. Will place foley and check CMP CBC this AM.  2 - Urosepsis - agree with current ABX pending CX data.   3 - Large Left Renal Stone - will need left percutantous surgery in elective setting after clears  infectiuos parameters and substantial GFR recovery, not this admission. KUB today to verify neph tube position. More UOP per urethra suggests mobile intra-renal stone.   4 - Will follow, call with questions anytime    [facility],  08/06/2014

## 2014-08-07 LAB — BASIC METABOLIC PANEL
Anion gap: 8 (ref 5–15)
BUN: 12 mg/dL (ref 6–20)
CALCIUM: 8.8 mg/dL — AB (ref 8.9–10.3)
CO2: 23 mmol/L (ref 22–32)
Chloride: 107 mmol/L (ref 101–111)
Creatinine, Ser: 1.41 mg/dL — ABNORMAL HIGH (ref 0.44–1.00)
GFR calc non Af Amer: 45 mL/min — ABNORMAL LOW (ref >60)
GFR, EST AFRICAN AMERICAN: 52 mL/min — AB (ref >60)
GLUCOSE: 92 mg/dL (ref 65–99)
Potassium: 3.3 mmol/L — ABNORMAL LOW (ref 3.5–5.1)
Sodium: 138 mmol/L (ref 135–145)

## 2014-08-07 LAB — GLUCOSE, CAPILLARY
GLUCOSE-CAPILLARY: 112 mg/dL — AB (ref 65–99)
GLUCOSE-CAPILLARY: 79 mg/dL (ref 65–99)
GLUCOSE-CAPILLARY: 85 mg/dL (ref 65–99)
Glucose-Capillary: 100 mg/dL — ABNORMAL HIGH (ref 65–99)
Glucose-Capillary: 93 mg/dL (ref 65–99)
Glucose-Capillary: 105 mg/dL — ABNORMAL HIGH (ref 65–99)

## 2014-08-07 MED ORDER — VANCOMYCIN HCL IN DEXTROSE 750-5 MG/150ML-% IV SOLN
750.0000 mg | Freq: Two times a day (BID) | INTRAVENOUS | Status: DC
  Administered 2014-08-07 – 2014-08-09 (×5): 750 mg via INTRAVENOUS
  Filled 2014-08-07 (×5): qty 150

## 2014-08-07 NOTE — Progress Notes (Signed)
[facility] PROGRESS NOTE  Jacqueline Norman:[id number] DOB: [birth year] DOA: 08/04/2014 PCP: Florentina Jenny, MD  Assessment/Plan: Principal Problem:   Acute encephalopathy -Multifactorial most likely related to recent infectious etiology in patient with history of MS and acute renal failure. - EEG obtained and reporting moderate to severe generalized nonspecific cerebral dysfunction (encephalopathy) - improving and will advance diet. Continue current medical management.    Sepsis - Improving on current antibiotics regimen - Transfer to floor given stable vital signs  Bacteremia - Patient is currently on vancomycin will await final culture sensitivity reports - Echocardiogram reports no vegetations.  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 Who presented to the hospital with 17 mm left UPJ stone. IR consulted for left percutaneous nephrostomy - Serum creatinine trending down    Hydronephrosis with renal and ureteral calculus obstruction - Urology on board. Please see above  Code Status: full Family Communication: discussed with fiancee  Disposition Plan: Pending improvement in condition.   Consultants:  Urology  IR  Procedures: left percutaneous nephrostomy  Antibiotics:  Vanc and Zosyn  HPI/Subjective: Pt feels better and is more responsive  Objective: Filed Vitals:   08/07/14 1416  BP: 124/81  Pulse: 90  Temp: 98 F (36.7 C)  Resp:     Intake/Output Summary (Last 24 hours) at 08/07/14 1612 Last data filed at 08/07/14 1500  Gross per 24 hour  Intake   1085 ml  Output   3500 ml  Net  -2415 ml   Filed Weights   08/05/14 0330 08/06/14 0600 08/06/14 1221  Weight: 89.8 kg (197 lb 15.6 oz) 89.5 kg (197 lb 5 oz) 89.1 kg (196 lb 6.9 oz)    Exam:   General:  Pt in nad, alert and awake not oriented  Cardiovascular:  rrr, no mrg  Respiratory: cta bl, no wheezes  Abdomen: soft, NT, ND  Musculoskeletal: no cyanosis or clubbing   Data Reviewed: Basic Metabolic Panel:  Recent Labs Lab 08/04/14 2327 08/05/14 0500 08/06/14 0810 08/07/14 0023  NA 139 138 142 138  K 4.7 4.5 3.8 3.3*  CL 108 109 113* 107  CO2 23 19* 20* 23  GLUCOSE 133* 174* 161* 92  BUN 25* 27* 19 12  CREATININE 3.71* 3.98* 2.14* 1.41*  CALCIUM 9.5 8.5* 8.5* 8.8*   Liver Function Tests:  Recent Labs Lab 08/04/14 2327 08/05/14 0500 08/06/14 0810  AST 21 18 17   ALT 18 15 16   ALKPHOS 54 49 52  BILITOT 0.9 1.2 1.1  PROT 7.8 7.2 7.3  ALBUMIN 3.5 3.0* 2.8*   No results for input(s): LIPASE, AMYLASE in the last 168 hours. No results for input(s): AMMONIA in the last 168 hours. CBC:  Recent Labs Lab 08/04/14 2327 08/05/14 0500 08/06/14 0810  WBC 11.1* 9.6 7.0  NEUTROABS  --  7.4  --   HGB 12.4 11.4* 10.4*  HCT 37.7 35.9* 32.2*  MCV 86.5 88.0 88.0  PLT 257 236 232   Cardiac Enzymes: No results for input(s): CKTOTAL, CKMB, CKMBINDEX, TROPONINI in the last 168 hours. BNP (last 3 results) No results for input(s): BNP in the last 8760 hours.  ProBNP (last 3 results) No results for input(s): PROBNP in the last 8760 hours.  CBG:  Recent Labs Lab 08/06/14 2214 08/07/14 0045 08/07/14 0414 08/07/14 0748 08/07/14 1152  GLUCAP 99 79 100* 112* 93    Recent Results (  from the past 240 hour(s))  Urine culture     Status: None (Preliminary result)   Collection Time: 08/04/14 11:28 PM  Result Value Ref Range Status   Specimen Description URINE, CLEAN CATCH  Final   Special Requests NONE  Final   Culture   Final    >=100,000 COLONIES/mL GRAM NEGATIVE RODS CULTURE REINCUBATED FOR BETTER GROWTH Performed at [facility]    Report Status PENDING  Incomplete  Blood Culture (routine x 2)     Status: None (Preliminary result)   Collection Time: 08/05/14 12:33 AM  Result Value Ref Range Status   Specimen  Description LEFT ANTECUBITAL  Final   Special Requests BOTTLES DRAWN AEROBIC AND ANAEROBIC  Final   Culture  Setup Time   Final    GRAM POSITIVE COCCI IN CLUSTERS IN BOTH AEROBIC AND ANAEROBIC BOTTLES CRITICAL RESULT CALLED TO, READ BACK BY AND VERIFIED WITH: M. REEVES RN [phone] GREEN R CONFIRMED BY R. HOLMES    Culture   Final    STAPHYLOCOCCUS SPECIES (COAGULASE NEGATIVE) Performed at [facility]    Report Status PENDING  Incomplete  Blood Culture (routine x 2)     Status: None (Preliminary result)   Collection Time: 08/05/14 12:33 AM  Result Value Ref Range Status   Specimen Description BLOOD RIGHT HAND  Final   Special Requests BOTTLES DRAWN AEROBIC AND ANAEROBIC  Final   Culture  Setup Time   Final    GRAM POSITIVE COCCI IN CLUSTERS IN BOTH AEROBIC AND ANAEROBIC BOTTLES GRAM STAIN REVIEWED-AGREE WITH RESULT MVESTAL  CRITICAL RESULT CALLED TO, READ BACK BY AND VERIFIED WITH: L FREI,RN AT 1006 08/06/14 BY KBARR    Culture   Final    STAPHYLOCOCCUS SPECIES (COAGULASE NEGATIVE) Performed at [facility]    Report Status PENDING  Incomplete  MRSA PCR Screening     Status: None   Collection Time: 08/05/14  4:18 AM  Result Value Ref Range Status   MRSA by PCR NEGATIVE NEGATIVE Final    Comment:        The GeneXpert MRSA Assay (FDA approved for NASAL specimens only), is one component of a comprehensive MRSA colonization surveillance program. It is not intended to diagnose MRSA infection nor to guide or monitor treatment for MRSA infections.   Urine culture     Status: None   Collection Time: 08/05/14 11:23 AM  Result Value Ref Range Status   Specimen Description URINE, RANDOM from kidney  Final   Special Requests NONE  Final   Culture   Final    NO GROWTH 1 DAY Performed at [facility]    Report Status 08/06/2014 FINAL  Final     Studies: Dg Abd 1 View  08/06/2014   CLINICAL DATA:  Decreased output from the left-sided nephrostomy tube  ; assess placement  EXAM: ABDOMEN - 1 VIEW  COMPARISON:  None  FINDINGS: A left nephrostomy tube is partially imaged. The tip of the tube overlies the left renal fossa. Its more lateral portion is not included in the field of view.  The bowel gas pattern is unremarkable. There are numerous phleboliths within the pelvis. There are degenerative changes of both hips.  IMPRESSION: The visualized portions of the nephrostomy tube project over the midpole region of the left kidney. Further evaluation with a nephrostogram, CT scanning, or ultrasound could be considered.   Electronically Signed   By: David  [place] M.D.   On: 08/06/2014 08:53  Scheduled Meds: . antiseptic oral rinse  7 mL Mouth Rinse BID  . levothyroxine  25 mcg Intravenous Daily  . piperacillin-tazobactam (ZOSYN)  IV  3.375 g Intravenous 3 times per day  . vancomycin  750 mg Intravenous Q12H   Continuous Infusions:    Time spent: > 35 minutes    Penny Pia  [facility] Pager [phone] If 7PM-7AM, please contact night-coverage at [email or web address], password [facility] 08/07/2014, 4:12 PM  LOS: 2 days

## 2014-08-07 NOTE — Progress Notes (Signed)
Patient ID: Jacqueline Norman, female   DOB: [birth year], 44 y.o.   MRN: [id number]    Referring Physician(s): CCS  Subjective:  She is feeling better; denies flank pain, nausea, vomiting.  Allergies: Review of patient's allergies indicates no known allergies.  Medications: Prior to Admission medications   Medication Sig Start Date End Date Taking? Authorizing Provider  baclofen (LIORESAL) 10 MG tablet Take 10 mg by mouth 2 (two) times daily.   Yes Historical Provider, MD  benztropine (COGENTIN) 1 MG tablet Take 1 mg by mouth 2 (two) times daily.   Yes Historical Provider, MD  cholecalciferol (VITAMIN D) 1000 UNITS tablet Take 1,000 Units by mouth every morning.   Yes Historical Provider, MD  docusate sodium (COLACE) 100 MG capsule Take 100 mg by mouth daily as needed for mild constipation.   Yes Historical Provider, MD  ferrous sulfate 325 (65 FE) MG tablet Take 1 tablet (325 mg total) by mouth 3 (three) times daily with meals. 08/05/13  Yes Belkys A Regalado, MD  Interferon Beta-1b (BETASERON/EXTAVIA) 0.3 MG KIT injection Inject 0.3 mg into the skin every other day.   Yes Historical Provider, MD  levothyroxine (SYNTHROID, LEVOTHROID) 50 MCG tablet Take 50 mcg by mouth daily before breakfast.   Yes Historical Provider, MD  omega-3 acid ethyl esters (LOVAZA) 1 G capsule Take 1 g by mouth 2 (two) times daily.    Yes Historical Provider, MD  potassium chloride SA (K-DUR,KLOR-CON) 20 MEQ tablet Take 20 mEq by mouth every morning.   Yes Historical Provider, MD  simvastatin (ZOCOR) 5 MG tablet Take 5 mg by mouth at bedtime.    Yes Historical Provider, MD  ciprofloxacin (CIPRO) 500 MG tablet Take 1 tablet (500 mg total) by mouth 2 (two) times daily. X 7 days to prevent post-op infection 10/08/13   Alexis Frock, MD  oxyCODONE-acetaminophen (PERCOCET/ROXICET) 5-325 MG per tablet Take 1-2 tablets by mouth every 6 (six) hours as needed for moderate pain. Post-operatively Patient not taking: Reported on  08/05/2014 10/08/13   Alexis Frock, MD     Vital Signs: BP 122/84 mmHg  Pulse 87  Temp(Src) 99.1 F (37.3 C) (Oral)  Resp 18  Ht _0  (1.651 m)  Wt 196 lb 6.9 oz (89.1 kg)  BMI 32.69 kg/m2  SpO2 99%  LMP   Physical Exam awake, alert. Left perc nephrostomy tube intact, dressing intact, output today 900 mL light yellow urine; PCN urine cultures negative  Imaging: Ct Abdomen Pelvis Wo Contrast  08/05/2014   CLINICAL DATA:  Sepsis.  Leukocytosis.  EXAM: CT ABDOMEN AND PELVIS WITHOUT CONTRAST  TECHNIQUE: Multidetector CT imaging of the abdomen and pelvis was performed following the standard protocol without IV contrast.  COMPARISON:  08/29/2013  FINDINGS: There is a 1.7 cm calculus in the left renal pelvis. There is another 1.7 cm calculus in the lower pole left collecting system. These are new from 08/29/2013. There is moderate left hydronephrosis. There is left perinephric stranding. The left ureter is normal in caliber, with no ureteral calculi.  In the right nephrectomy bed, there is a 2.4 x 4.3 by 3.7 cm complex -appearing collection which could represent residual abscess or residual inflammation. The nephrectomy bed is otherwise unremarkable.  There is cholelithiasis.  There are unremarkable unenhanced appearances of the liver, spleen, pancreas and adrenals.  Bowel is unremarkable.  Uterus and ovaries appear unremarkable.  Abdominal aorta is normal in caliber with mild atherosclerotic calcification.  There is mild right lung base opacity which  could be atelectatic or infectious. There are no effusions.  No significant musculoskeletal lesion is evident. Moderately severe degenerative disc and facet changes are present in the lower lumbar spine. There is a bilateral L5 spondylolysis with minimal spondylolisthesis.  IMPRESSION: 1. New left nephrolithiasis and hydronephrosis. No ureteral calculi are evident. There is a 1.7 cm left renal pelvic calculus and another 1.7 cm left lower pole collecting  system calculus. 2. 2.4 x 3.7 x 4.3 cm right nephrectomy bed collection which could represent residual abscess or inflammation. 3. Cholelithiasis without evidence of an acute process in the biliary system. 4. Mild right lung base opacity due to atelectasis or infection. 5. Incidentally noted spondylolysis and minimal spondylolisthesis at L5. Lower lumbar degenerative disc and facet changes.   Electronically Signed   By: Andreas Newport M.D.   On: 08/05/2014 03:12   Dg Abd 1 View  08/06/2014   CLINICAL DATA:  Decreased output from the left-sided nephrostomy tube ; assess placement  EXAM: ABDOMEN - 1 VIEW  COMPARISON:  None  FINDINGS: A left nephrostomy tube is partially imaged. The tip of the tube overlies the left renal fossa. Its more lateral portion is not included in the field of view.  The bowel gas pattern is unremarkable. There are numerous phleboliths within the pelvis. There are degenerative changes of both hips.  IMPRESSION: The visualized portions of the nephrostomy tube project over the midpole region of the left kidney. Further evaluation with a nephrostogram, CT scanning, or ultrasound could be considered.   Electronically Signed   By: David  Martinique M.D.   On: 08/06/2014 08:53   Ct Head Wo Contrast  08/05/2014   CLINICAL DATA:  Altered mental status. History of multiple sclerosis. Mental status changes began at 19:00.  EXAM: CT HEAD WITHOUT CONTRAST  TECHNIQUE: Contiguous axial images were obtained from the base of the skull through the vertex without intravenous contrast.  COMPARISON:  None.  FINDINGS: There is no intracranial hemorrhage, mass or evidence of acute infarction. There is no extra-axial fluid collection. There is moderate generalized atrophy, greatest in the frontal lobes. There is extensive cerebral white matter hypodensity, greatest in the periventricular regions, nonspecific but consistent with the described history of long-standing demyelinating disease. No acute intracranial  findings are evident. No bony abnormality is evident.  IMPRESSION: Severe white matter hypodensities in both cerebral hemispheres, consistent with the described history of longstanding demyelinating disease. Moderate generalized atrophy, greatest in the frontal lobes. No acute findings are evident.   Electronically Signed   By: Andreas Newport M.D.   On: 08/05/2014 00:41   US Renal  08/05/2014   CLINICAL DATA:  Renal insufficiency.  Right nephrectomy.  EXAM: RENAL / URINARY TRACT ULTRASOUND COMPLETE  COMPARISON:  CT 08/29/2013  FINDINGS: Right Kidney:  Absent.  Prior nephrectomy.  Left Kidney:  Length: 14.6 cm. There is a lower pole left renal calculus with branching morphology suggesting early staghorn formation. No hydronephrosis.  Bladder:  Grossly normal with limited visibility due to low volume.  IMPRESSION: Lower pole left nephrolithiasis with branching morphology suggesting early staghorn. No hydronephrosis.   Electronically Signed   By: Andreas Newport M.D.   On: 08/05/2014 02:52   Dg Chest Port 1 View  08/05/2014   CLINICAL DATA:  Sepsis.  EXAM: PORTABLE CHEST - 1 VIEW  COMPARISON:  02/03/2004  FINDINGS: There is elevation of right hemidiaphragm with adjacent linear opacity, likely atelectasis. Lung volumes are low. Cardiomediastinal contours are normal for technique. No large pleural  effusion or pneumothorax. No pulmonary edema.  IMPRESSION: Hypoventilatory chest with elevation of right hemidiaphragm and adjacent linear opacities, likely atelectasis.   Electronically Signed   By: Jeb Levering M.D.   On: 08/05/2014 02:45   Ir Nephrostomy Placement Left  08/06/2014   CLINICAL DATA:  Left hydronephrosis secondary to obstructing calculus. Previous right nephrectomy for XGPN. Fever.  EXAM: LEFT PERCUTANEOUS NEPHROSTOMY CATHETER PLACEMENT UNDER ULTRASOUND AND FLUOROSCOPIC GUIDANCE  FLUOROSCOPY TIME:  5 minutes, 66 mGy  TECHNIQUE: The procedure, risks (including but not limited to bleeding,  infection, organ damage ), benefits, and alternatives were explained to the patient. Questions regarding the procedure were encouraged and answered. The patient understands and consents to the procedure.  After laterality was marked, the leftFlank region prepped with Betadine, draped in usual sterile fashion, infiltrated locally with 1% lidocaine.Patient was receiving adequate prophylactic antibiotic coverage.  Intravenous Fentanyl and Versed were administered as conscious sedation during continuous cardiorespiratory monitoring by the radiology RN, with a total moderate sedation time of 16 minutes.  Under real-time ultrasound guidance, a 21-gauge trocar needle was advanced into a posterior lower pole calyx. Ultrasound image documentation was saved. Urine spontaneously returned through the needle. Needle was exchanged over a guidewire for transitional dilator. Contrast injection confirmed appropriate positioning. Catheter was exchanged over a guidewire for a 10 French pigtail catheter, formed centrally within the left renal collecting system. Contrast injection confirms appropriate positioning and patency. Catheter secured externally with 0 Prolene suture and placed to external drain bag.  COMPLICATIONS: COMPLICATIONS none  IMPRESSION: 1. Technically successful left percutaneous nephrostomy catheter placement.   Electronically Signed   By: Lucrezia Europe M.D.   On: 08/06/2014 12:40    Labs:  CBC:  Recent Labs  10/07/13 0816 08/04/14 2327 08/05/14 0500 08/06/14 0810  WBC 8.2 11.1* 9.6 7.0  HGB 9.7* 12.4 11.4* 10.4*  HCT 31.4* 37.7 35.9* 32.2*  PLT 220 257 236 232    COAGS:  Recent Labs  08/05/14 0500  INR 1.19  APTT 31    BMP:  Recent Labs  08/04/14 2327 08/05/14 0500 08/06/14 0810 08/07/14 0023  NA 139 138 142 138  K 4.7 4.5 3.8 3.3*  CL 108 109 113* 107  CO2 23 19* 20* 23  GLUCOSE 133* 174* 161* 92  BUN 25* 27* 19 12  CALCIUM 9.5 8.5* 8.5* 8.8*  CREATININE 3.71* 3.98* 2.14*  1.41*  GFRNONAA 14* 13* 27* 45*  GFRAA 16* 15* 31* 52*    LIVER FUNCTION TESTS:  Recent Labs  08/04/14 2327 08/05/14 0500 08/06/14 0810  BILITOT 0.9 1.2 1.1  AST _0 ALT _1 ALKPHOS 54 49 52  PROT 7.8 7.2 7.3  ALBUMIN 3.5 3.0* 2.8*    Assessment and Plan: S/p left PCN 6/28 secondary to obstructive hydronephrosis/stone/sepsis/ARF; afebrile; creatinine down to 1.41, previously 2.14; K 3.3; PCN urine cultures negative, blood cultures pending; replace K; continue PCN for now as outlined by urology.   Signed: D. Rowe Robert 08/07/2014, 10:45 AM   I spent a total of 15 minutes in face to face in clinical consultation/evaluation, greater than 50% of which was counseling/coordinating care for left percutaneous nephrostomy

## 2014-08-07 NOTE — Progress Notes (Signed)
Subjective:  1 - Acute Renal Failure / Obstruction of Solitary Left Kidney - CR 3.98 on admit 6/26 up from baseline <1 with hdro of solitary kidney from large volume stone. Now s/p neph tube 6/27. 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   2 - Urosepsis - fever, tachycardia, bacteruria on presentationthis admission c/w likely urosepsis. BCX 6/26 with staph x2 / pending. UCX 6/26 GNR"s / pending. On empiric Vanc + Zosyn.  3 - Large Left Renal Stone - multifocal left renal stone, total volume about 2.5 cm by CT 07/2014. This is new since last imaging 2015 at time of right nephrectomy.   Today Maurissa is improved substantially. Back to baseline mental status. GFR improving quickly, Cr <1.5 today.   Objective: Vital signs in last 24 hours: Temp:  [98 F (36.7 C)-99.1 F (37.3 C)] 98 F (36.7 C) (06/29 1416) Pulse Rate:  [86-90] 90 (06/29 1416) Resp:  [18] 18 (06/29 0523) BP: (122-125)/(81-84) 124/81 mmHg (06/29 1416) SpO2:  [98 %-100 %] 100 % (06/29 1416) Last BM Date: 08/04/14  Intake/Output from previous day: 06/28 0701 - 06/29 0700 In: 370 [IV Piggyback:350] Out: 3010 [Urine:3010] Intake/Output this shift: Total I/O In: -  Out: 1150 [Urine:1150]  General appearance: alert, cooperative and now at basline mental status with sinificnat compromise but very interactive.  Head: Normocephalic, without obvious abnormality, atraumatic Throat: lips, mucosa, and tongue normal; teeth and gums normal Neck: supple, symmetrical, trachea midline Back: symmetric, no curvature. ROM normal. No CVA tenderness., neph tube c/d/i with clear urine.  Resp: non-labored, no coarse breath sounds Cardio: Nl rate Pelvic: external genitalia normal and foley c/d/i. with clear urine.  Extremities: extremities normal, atraumatic, no cyanosis or edema Lymph nodes: Cervical, supraclavicular, and axillary nodes normal. Neurologic:  Grossly normal  Lab Results:   Recent Labs  08/05/14 0500 08/06/14 0810  WBC 9.6 7.0  HGB 11.4* 10.4*  HCT 35.9* 32.2*  PLT 236 232   BMET  Recent Labs  08/06/14 0810 08/07/14 0023  NA 142 138  K 3.8 3.3*  CL 113* 107  CO2 20* 23  GLUCOSE 161* 92  BUN 19 12  CREATININE 2.14* 1.41*  CALCIUM 8.5* 8.8*   PT/INR  Recent Labs  08/05/14 0500  LABPROT 15.3*  INR 1.19   ABG  Recent Labs  08/05/14 0212  PHART 7.380  HCO3 17.9*    Studies/Results: Dg Abd 1 View  08/06/2014   CLINICAL DATA:  Decreased output from the left-sided nephrostomy tube ; assess placement  EXAM: ABDOMEN - 1 VIEW  COMPARISON:  None  FINDINGS: A left nephrostomy tube is partially imaged. The tip of the tube overlies the left renal fossa. Its more lateral portion is not included in the field of view.  The bowel gas pattern is unremarkable. There are numerous phleboliths within the pelvis. There are degenerative changes of both hips.  IMPRESSION: The visualized portions of the nephrostomy tube project over the midpole region of the left kidney. Further evaluation with a nephrostogram, CT scanning, or ultrasound could be considered.   Electronically Signed   By: David  [place]az[place] M.D.   On: 08/06/2014 08:53    Anti-infectives: Anti-infectives    Start     Dose/Rate Route Frequency Ordered Stop   08/07/14 1800  vancomycin (VANCOCIN) IVPB 750 mg/150 ml premix     750 mg 150 mL/hr over 60 Minutes Intravenous Every 12 hours 08/07/14 1048     08/06/14 0600  vancomycin (VANCOCIN) IVPB 1000 mg/200 mL premix  Status:  Discontinued     1,000 mg 200 mL/hr over 60 Minutes Intravenous Every 24 hours 08/05/14 0420 08/07/14 1048   08/05/14 0345  piperacillin-tazobactam (ZOSYN) IVPB 3.375 g     3.375 g 12.5 mL/hr over 240 Minutes Intravenous 3 times per day 08/05/14 0343     08/05/14 0345  vancomycin (VANCOCIN) IVPB 1000 mg/200 mL premix     1,000 mg 200 mL/hr over 60 Minutes Intravenous  Once 08/05/14 0343  08/05/14 0537   08/05/14 0130  cefTRIAXone (ROCEPHIN) 1 g in dextrose 5 % 50 mL IVPB     1 g 100 mL/hr over 30 Minutes Intravenous  Once 08/05/14 0116 08/05/14 0313      Assessment/Plan:   1 - Acute Renal Failure / Obstruction of Solitary Left Kidney - now s/p neph tube and GFR nearly to baseline.   2 - Urosepsis - agree with current ABX pending final CX data.   3 - Large Left Renal Stone - will need left percutantous surgery in elective setting after clears infectiuos parameters and substantial GFR recovery, not this admission.   4 - Will follow, call with questions anytime  [facility],  08/07/2014

## 2014-08-07 NOTE — Progress Notes (Signed)
ANTIBIOTIC CONSULT NOTE   Pharmacy Consult for Vancomycin and Zosyn  Indication: sepsis  No Known Allergies  Patient Measurements: Height:  (165.1 cm) Weight: 196 lb 6.9 oz (89.1 kg) IBW/kg (Calculated) : 57 Adjusted Body Weight:   Vital Signs: Temp: 99.1 F (37.3 C) (06/29 0523) Temp Source: Oral (06/29 0523) BP: 122/84 mmHg (06/29 0523) Pulse Rate: 87 (06/29 0523) Intake/Output from previous day: 06/28 0701 - 06/29 0700 In: 370 [IV Piggyback:350] Out: 3010 [Urine:3010] Intake/Output from this shift: Total I/O In: -  Out: 900 [Urine:900]  Labs:  Recent Labs  08/04/14 2327 08/05/14 0500 08/06/14 0810 08/07/14 0023  WBC 11.1* 9.6 7.0  --   HGB 12.4 11.4* 10.4*  --   PLT 257 236 232  --   CREATININE 3.71* 3.98* 2.14* 1.41*   Estimated Creatinine Clearance: 56.7 mL/min (by C-G formula based on Cr of 1.41). No results for input(s): VANCOTROUGH, VANCOPEAK, VANCORANDOM, GENTTROUGH, GENTPEAK, GENTRANDOM, TOBRATROUGH, TOBRAPEAK, TOBRARND, AMIKACINPEAK, AMIKACINTROU, AMIKACIN in the last 72 hours.   Microbiology: Recent Results (from the past 720 hour(s))  Urine culture     Status: None (Preliminary result)   Collection Time: 08/04/14 11:28 PM  Result Value Ref Range Status   Specimen Description URINE, CLEAN CATCH  Final   Special Requests NONE  Final   Culture   Final    >=100,000 COLONIES/mL GRAM NEGATIVE RODS Performed at [facility]    Report Status PENDING  Incomplete  Blood Culture (routine x 2)     Status: None (Preliminary result)   Collection Time: 08/05/14 12:33 AM  Result Value Ref Range Status   Specimen Description LEFT ANTECUBITAL  Final   Special Requests BOTTLES DRAWN AEROBIC AND ANAEROBIC  Final   Culture  Setup Time   Final    GRAM POSITIVE COCCI IN CLUSTERS IN BOTH AEROBIC AND ANAEROBIC BOTTLES CRITICAL RESULT CALLED TO, READ BACK BY AND VERIFIED WITH: M. REEVES RN [phone] GREEN R CONFIRMED BY R. HOLMES    Culture   Final     NO GROWTH 1 DAY Performed at [facility]    Report Status PENDING  Incomplete  Blood Culture (routine x 2)     Status: None (Preliminary result)   Collection Time: 08/05/14 12:33 AM  Result Value Ref Range Status   Specimen Description BLOOD RIGHT HAND  Final   Special Requests BOTTLES DRAWN AEROBIC AND ANAEROBIC  Final   Culture  Setup Time   Final    GRAM POSITIVE COCCI IN CLUSTERS IN BOTH AEROBIC AND ANAEROBIC BOTTLES GRAM STAIN REVIEWED-AGREE WITH RESULT MVESTAL  CRITICAL RESULT CALLED TO, READ BACK BY AND VERIFIED WITH: L FREI,RN AT 1006 08/06/14 BY KBARR    Culture   Final    NO GROWTH 1 DAY Performed at [facility]    Report Status PENDING  Incomplete  MRSA PCR Screening     Status: None   Collection Time: 08/05/14  4:18 AM  Result Value Ref Range Status   MRSA by PCR NEGATIVE NEGATIVE Final    Comment:        The GeneXpert MRSA Assay (FDA approved for NASAL specimens only), is one component of a comprehensive MRSA colonization surveillance program. It is not intended to diagnose MRSA infection nor to guide or monitor treatment for MRSA infections.   Urine culture     Status: None   Collection Time: 08/05/14 11:23 AM  Result Value Ref Range Status   Specimen Description URINE, RANDOM from  kidney  Final   Special Requests NONE  Final   Culture   Final    NO GROWTH 1 DAY Performed at [facility]    Report Status 08/06/2014 FINAL  Final    Medical History: Past Medical History  Diagnosis Date  . MS (multiple sclerosis)     BED BOUND -UP IN W/C USING HOYER LIFT- UNABLE TO MOVE ARMS AND LEGS - NOT ABLE TO FEED SELF- NO TROUBLE SWALLOWING - STATES SHE EATS WELL; INCONTINENT - WEARS DEPENDS  . Xanthogranulomatous pyelonephritis     RIGHT   . Perinephric abscess     HOSPITALIZED 07/29/13 TO 08/05/13.WITH SEPSIS  . Hypothyroidism   . Anemia     Medications:  Anti-infectives    Start     Dose/Rate Route Frequency Ordered Stop    08/06/14 0600  vancomycin (VANCOCIN) IVPB 1000 mg/200 mL premix     1,000 mg 200 mL/hr over 60 Minutes Intravenous Every 24 hours 08/05/14 0420     08/05/14 0345  piperacillin-tazobactam (ZOSYN) IVPB 3.375 g     3.375 g 12.5 mL/hr over 240 Minutes Intravenous 3 times per day 08/05/14 0343     08/05/14 0345  vancomycin (VANCOCIN) IVPB 1000 mg/200 mL premix     1,000 mg 200 mL/hr over 60 Minutes Intravenous  Once 08/05/14 0343 08/05/14 0537   08/05/14 0130  cefTRIAXone (ROCEPHIN) 1 g in dextrose 5 % 50 mL IVPB     1 g 100 mL/hr over 30 Minutes Intravenous  Once 08/05/14 0116 08/05/14 0313     Assessment: 44 YOF with MS, chronic quadriplegia in ED with AMS. She has solitary kidney.  Pharmacy asked to dose vancomycin and zosyn.  Blood cultures gram stains reveal GPC clusters, urine culture with GNR.    08/05/2014 >> vanc >> 08/05/2014 >> zosyn >>  6/27 bloodx2: 2/2 GPC in clusters 6/26 urine: NG 6/27 urine: >100k GNR 6/27 tissue cx from left perc nephrostomy: ?? Done  Renal: Scr has improved, resolved obstruction s/p R nephrostomy Afeb and WBC WNL  Goal of Therapy:  Vancomycin trough level 15-20 mcg/ml  Zosyn based on renal function   Plan:  Day #2 vanco/zosyn  Renal function improving, change to vanco 750mg  IV q12h  Dose on more conservative side for solitary kidney, bedbound state  Check trough if remains on vanco  Continue zosyn 3.375gm IV q8h over 4h infusion  Follow renal function  Follow final culture results  Juliette Alcide, PharmD, BCPS.   Pager: [phone] 08/07/2014,10:40 AM

## 2014-08-07 NOTE — Care Management (Signed)
Important Message  Patient Details  Name: Jacqueline Norman MRN: [id number] Date of Birth: [birth year]   Medicare Important Message Given:  Yes-second notification given    Haskell Flirt, LCSW 08/07/2014, 11:08 AM

## 2014-08-08 DIAGNOSIS — N390 Urinary tract infection, site not specified: Secondary | ICD-10-CM

## 2014-08-08 DIAGNOSIS — B957 Other staphylococcus as the cause of diseases classified elsewhere: Secondary | ICD-10-CM

## 2014-08-08 DIAGNOSIS — L0291 Cutaneous abscess, unspecified: Secondary | ICD-10-CM

## 2014-08-08 DIAGNOSIS — Z7401 Bed confinement status: Secondary | ICD-10-CM

## 2014-08-08 DIAGNOSIS — Z905 Acquired absence of kidney: Secondary | ICD-10-CM

## 2014-08-08 DIAGNOSIS — Z936 Other artificial openings of urinary tract status: Secondary | ICD-10-CM

## 2014-08-08 DIAGNOSIS — N1330 Unspecified hydronephrosis: Secondary | ICD-10-CM

## 2014-08-08 LAB — GLUCOSE, CAPILLARY
GLUCOSE-CAPILLARY: 135 mg/dL — AB (ref 65–99)
GLUCOSE-CAPILLARY: 142 mg/dL — AB (ref 65–99)
GLUCOSE-CAPILLARY: 159 mg/dL — AB (ref 65–99)
Glucose-Capillary: 103 mg/dL — ABNORMAL HIGH (ref 65–99)
Glucose-Capillary: 129 mg/dL — ABNORMAL HIGH (ref 65–99)
Glucose-Capillary: 102 mg/dL — ABNORMAL HIGH (ref 65–99)
Glucose-Capillary: 160 mg/dL — ABNORMAL HIGH (ref 65–99)

## 2014-08-08 MED ORDER — BACLOFEN 10 MG PO TABS
10.0000 mg | ORAL_TABLET | Freq: Two times a day (BID) | ORAL | Status: DC
  Administered 2014-08-08 – 2014-08-11 (×6): 10 mg via ORAL
  Filled 2014-08-08 (×7): qty 1

## 2014-08-08 MED ORDER — FERROUS SULFATE 325 (65 FE) MG PO TABS
325.0000 mg | ORAL_TABLET | Freq: Three times a day (TID) | ORAL | Status: DC
Start: 2014-08-09 — End: 2014-08-11
  Administered 2014-08-09 – 2014-08-11 (×8): 325 mg via ORAL
  Filled 2014-08-08 (×9): qty 1

## 2014-08-08 MED ORDER — DOCUSATE SODIUM 100 MG PO CAPS: 100.0000 mg | ORAL_CAPSULE | Freq: Every day | ORAL | Status: DC | PRN

## 2014-08-08 MED ORDER — BENZTROPINE MESYLATE 1 MG PO TABS
1.0000 mg | ORAL_TABLET | Freq: Two times a day (BID) | ORAL | Status: DC
  Administered 2014-08-08 – 2014-08-11 (×6): 1 mg via ORAL
  Filled 2014-08-08 (×7): qty 1

## 2014-08-08 MED ORDER — LEVOTHYROXINE SODIUM 50 MCG PO TABS
50.0000 ug | ORAL_TABLET | Freq: Every day | ORAL | Status: DC
  Administered 2014-08-09 – 2014-08-11 (×3): 50 ug via ORAL
  Filled 2014-08-08 (×3): qty 1

## 2014-08-08 MED ORDER — SIMVASTATIN 5 MG PO TABS
5.0000 mg | ORAL_TABLET | Freq: Every day | ORAL | Status: DC
  Administered 2014-08-08 – 2014-08-10 (×3): 5 mg via ORAL
  Filled 2014-08-08 (×4): qty 1

## 2014-08-08 MED ORDER — OMEGA-3-ACID ETHYL ESTERS 1 G PO CAPS
1.0000 g | ORAL_CAPSULE | Freq: Two times a day (BID) | ORAL | Status: DC
  Administered 2014-08-08 – 2014-08-11 (×6): 1 g via ORAL
  Filled 2014-08-08 (×7): qty 1

## 2014-08-08 NOTE — Consult Note (Signed)
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  Await sensi  Comment-  Her TTE is negative, she has a staph epi and she has no indwelling prosthesis per pt/family. Would defer on IE w/u at this time.   Thank you so much for this interesting consult,   Bobby Rumpf (pager) [phone] [email or web address]  Jacqueline Norman is an 44 y.o. female.  HPI: 44 yo F with hx of MS, bed bound, prev perinephric abscess (07-2013) comes to ED on 6-27 with mental status change and fever (101.5). She was felt to have uti/sepsis, was found on CT abd to have L hydro with 2/5 cm multifocal stone, and R abscess in site of previous nephrectomy. She was started on vanco/zosyn.  She had L percutaneous nephrostomy on 6-28 and was doing well enough to be transferred out to regular floor on 6-28.   Her UCx is GNR and her BCx are 2/2 staph epi.   Currently, back to baseline.   Past Medical History  Diagnosis Date  . MS (multiple sclerosis)     BED BOUND -UP IN W/C USING HOYER LIFT- UNABLE TO MOVE ARMS AND LEGS - NOT ABLE TO FEED SELF- NO TROUBLE SWALLOWING - STATES SHE EATS WELL; INCONTINENT - WEARS DEPENDS  . Xanthogranulomatous pyelonephritis     RIGHT   . Perinephric abscess     HOSPITALIZED 07/29/13 TO 08/05/13.WITH SEPSIS  . Hypothyroidism   . Anemia     Past Surgical History  Procedure Laterality Date  . No past surgeries    . Robot assisted laparoscopic nephrectomy Right 10/05/2013    Procedure: ROBOTIC ASSISTED LAPAROSCOPIC RADICAL NEPHRECTOMY;  Surgeon: Alexis Frock, MD;  Location: [facility]  [facility];  Service: Urology;  Laterality: Right;     No Known Allergies  Medications:  Scheduled: . antiseptic oral rinse  7 mL Mouth Rinse BID  . levothyroxine  25 mcg Intravenous Daily  . piperacillin-tazobactam (ZOSYN)  IV  3.375 g Intravenous 3 times per day  . vancomycin  750 mg Intravenous Q12H    Abtx:  Anti-infectives    Start     Dose/Rate Route Frequency Ordered Stop   08/07/14 1800  vancomycin (VANCOCIN) IVPB 750 mg/150 ml premix     750 mg 150 mL/hr over 60 Minutes Intravenous Every 12 hours 08/07/14 1048     08/06/14 0600  vancomycin (VANCOCIN) IVPB 1000 mg/200 mL premix  Status:  Discontinued     1,000 mg 200 mL/hr over 60 Minutes Intravenous Every 24 hours 08/05/14 0420 08/07/14 1048   08/05/14 0345  piperacillin-tazobactam (ZOSYN) IVPB 3.375 g     3.375 g 12.5 mL/hr over 240 Minutes Intravenous 3 times per day 08/05/14 0343     08/05/14 0345  vancomycin (VANCOCIN) IVPB 1000 mg/200 mL premix     1,000 mg 200 mL/hr over 60 Minutes Intravenous  Once 08/05/14 0343 08/05/14 0537   08/05/14 0130  cefTRIAXone (ROCEPHIN) 1 g in dextrose 5 % 50 mL IVPB     1 g 100 mL/hr over 30 Minutes Intravenous  Once 08/05/14 0116 08/05/14 0313      Total  days of antibiotics: 4 (vanco/zosyn)          Social History:  reports that she quit smoking about 17 years ago. She has never used smokeless tobacco. She reports that she does not drink alcohol or use illicit drugs.  History reviewed. No pertinent family history.  General ROS: eating well, has nephrostomy, normal urine, no fevers, see HPI.   Blood pressure 115/94, pulse 90, temperature 98 F (36.7 C), temperature source Oral, resp. rate 18, height 5' 5"  (1.651 m), weight 89.1 kg (196 lb 6.9 oz), SpO2 100 %. General appearance: alert, no distress and slowed mentation Eyes: negative findings: conjunctivae and sclerae normal and pupils equal, round, reactive to light and accomodation Throat: normal findings: oropharynx pink & moist  without lesions or evidence of thrush Neck: no adenopathy and supple, symmetrical, trachea midline Lungs: clear to auscultation bilaterally Heart: regular rate and rhythm Abdomen: normal findings: bowel sounds normal and soft, non-tender Extremities: edema anasarca Neurologic: Mental status: alert, thought content appears appropriate. slowed mentation.    Results for orders placed or performed during the hospital encounter of 08/04/14 (from the past 48 hour(s))  Glucose, capillary     Status: Abnormal   Collection Time: 08/06/14  5:26 PM  Result Value Ref Range   Glucose-Capillary 135 (H) 65 - 99 mg/dL  Glucose, capillary     Status: None   Collection Time: 08/06/14 10:14 PM  Result Value Ref Range   Glucose-Capillary 99 65 - 99 mg/dL   Comment 1 Notify RN    Comment 2 Document in Chart   Basic metabolic panel     Status: Abnormal   Collection Time: 08/07/14 12:23 AM  Result Value Ref Range   Sodium 138 135 - 145 mmol/L   Potassium 3.3 (L) 3.5 - 5.1 mmol/L   Chloride 107 101 - 111 mmol/L   CO2 23 22 - 32 mmol/L   Glucose, Bld 92 65 - 99 mg/dL   BUN 12 6 - 20 mg/dL   Creatinine, Ser 1.41 (H) 0.44 - 1.00 mg/dL   Calcium 8.8 (L) 8.9 - 10.3 mg/dL   GFR calc non Af Amer 45 (L) >60 mL/min   GFR calc Af Amer 52 (L) >60 mL/min    Comment: (NOTE) The eGFR has been calculated using the CKD EPI equation. This calculation has not been validated in all clinical situations. eGFR's persistently <60 mL/min signify possible Chronic Kidney Disease.    Anion gap 8 5 - 15  Glucose, capillary     Status: None   Collection Time: 08/07/14 12:45 AM  Result Value Ref Range   Glucose-Capillary 79 65 - 99 mg/dL  Glucose, capillary     Status: Abnormal   Collection Time: 08/07/14  4:14 AM  Result Value Ref Range   Glucose-Capillary 100 (H) 65 - 99 mg/dL  Glucose, capillary     Status: Abnormal   Collection Time: 08/07/14  7:48 AM  Result Value Ref Range   Glucose-Capillary 112 (H) 65 - 99  mg/dL  Glucose, capillary     Status: None   Collection Time: 08/07/14 11:52 AM  Result Value Ref Range   Glucose-Capillary 93 65 - 99 mg/dL  Glucose, capillary     Status: None   Collection Time: 08/07/14  4:50 PM  Result Value Ref Range   Glucose-Capillary 85 65 - 99 mg/dL  Glucose, capillary     Status: Abnormal   Collection Time: 08/07/14  8:08 PM  Result Value Ref Range  Glucose-Capillary 105 (H) 65 - 99 mg/dL  Glucose, capillary     Status: Abnormal   Collection Time: 08/08/14 12:01 AM  Result Value Ref Range   Glucose-Capillary 135 (H) 65 - 99 mg/dL  Glucose, capillary     Status: Abnormal   Collection Time: 08/08/14  4:18 AM  Result Value Ref Range   Glucose-Capillary 159 (H) 65 - 99 mg/dL  Glucose, capillary     Status: Abnormal   Collection Time: 08/08/14  7:46 AM  Result Value Ref Range   Glucose-Capillary 103 (H) 65 - 99 mg/dL  Glucose, capillary     Status: Abnormal   Collection Time: 08/08/14 11:56 AM  Result Value Ref Range   Glucose-Capillary 129 (H) 65 - 99 mg/dL      Component Value Date/Time   SDES URINE, RANDOM from kidney 08/05/2014 1123   SPECREQUEST NONE 08/05/2014 1123   CULT  08/05/2014 1123    NO GROWTH 1 DAY Performed at [facility] 08/06/2014 FINAL 08/05/2014 1123   No results found. Recent Results (from the past 240 hour(s))  Urine culture     Status: None (Preliminary result)   Collection Time: 08/04/14 11:28 PM  Result Value Ref Range Status   Specimen Description URINE, CLEAN CATCH  Final   Special Requests NONE  Final   Culture   Final    >=100,000 COLONIES/mL GRAM NEGATIVE RODS Performed at [facility]    Report Status PENDING  Incomplete  Blood Culture (routine x 2)     Status: None (Preliminary result)   Collection Time: 08/05/14 12:33 AM  Result Value Ref Range Status   Specimen Description LEFT ANTECUBITAL  Final   Special Requests BOTTLES DRAWN AEROBIC AND ANAEROBIC  Final   Culture  Setup  Time   Final    GRAM POSITIVE COCCI IN CLUSTERS IN BOTH AEROBIC AND ANAEROBIC BOTTLES CRITICAL RESULT CALLED TO, READ BACK BY AND VERIFIED WITH: M. REEVES RN [phone] [place]    Culture   Final    STAPHYLOCOCCUS SPECIES (COAGULASE NEGATIVE) Performed at [facility]    Report Status PENDING  Incomplete  Blood Culture (routine x 2)     Status: None (Preliminary result)   Collection Time: 08/05/14 12:33 AM  Result Value Ref Range Status   Specimen Description BLOOD RIGHT HAND  Final   Special Requests BOTTLES DRAWN AEROBIC AND ANAEROBIC  Final   Culture  Setup Time   Final    GRAM POSITIVE COCCI IN CLUSTERS IN BOTH AEROBIC AND ANAEROBIC BOTTLES GRAM STAIN REVIEWED-AGREE WITH RESULT MVESTAL  CRITICAL RESULT CALLED TO, READ BACK BY AND VERIFIED WITH: L FREI,RN AT 1006 08/06/14 BY KBARR    Culture   Final    STAPHYLOCOCCUS SPECIES (COAGULASE NEGATIVE) Performed at [facility]    Report Status PENDING  Incomplete  MRSA PCR Screening     Status: None   Collection Time: 08/05/14  4:18 AM  Result Value Ref Range Status   MRSA by PCR NEGATIVE NEGATIVE Final    Comment:        The GeneXpert MRSA Assay (FDA approved for NASAL specimens only), is one component of a comprehensive MRSA colonization surveillance program. It is not intended to diagnose MRSA infection nor to guide or monitor treatment for MRSA infections.   Urine culture     Status: None   Collection Time: 08/05/14 11:23 AM  Result Value Ref Range Status  Specimen Description URINE, RANDOM from kidney  Final   Special Requests NONE  Final   Culture   Final    NO GROWTH 1 DAY Performed at [facility]    Report Status 08/06/2014 FINAL  Final      08/08/2014, 3:36 PM     LOS: 3 days

## 2014-08-08 NOTE — Progress Notes (Signed)
Nutrition Follow-up  DOCUMENTATION CODES:  Not applicable  INTERVENTION: - Continue Heart Healthy diet - RD will continue to monitor for needs  NUTRITION DIAGNOSIS:  Inadequate oral intake related to inability to eat as evidenced by NPO status. -resolving with diet advancement  GOAL:  Patient will meet greater than or equal to 90% of their needs -variably met  MONITOR:  Diet advancement, Weight trends, Labs, I & O's  ASSESSMENT: 44 y.o. female with history of multiple sclerosis was brought to the ER after patient was found to be increasingly somnolent and difficult to arouse. As per the family patient was doing fine until 7 PM last night when patient became somnolent and became increasingly difficult to arouse. Patient was brought to the ER and was found to be febrile and on the responding to deep sternal rub. As per the family patient was not started on any new medications or did not miss any of her medications. Patient did not have any nausea vomiting abdominal pain diarrhea chest pain shortness of breath productive cough prior to this episode.  Pt with hx of multiple sclerosis and MD note indicates pt unable to move arms, legs. Visitor in the room feeding pt at time of visit. She had consumed 1 of her 3 pancakes. She states appetite has been fair related to some nausea. Chart review indicates 75% consumption of dinner 6/28 with no other intakes recorded.   Pt transferred from ICU/SDU 6/28.  Pt likely variably meeting needs at this time. Declines need for supplements or other dietary needs at this time. Medications reviewed. Labs reviewed; CBGs: 79-159 mg/dL, K: 3.3 mmol/L, creatinine elevated, Ca: 8.6 mg/dL, GFR: 52.  Height:  Ht Readings from Last 1 Encounters:  08/06/14 5' 5"  (1.651 m)    Weight:  Wt Readings from Last 1 Encounters:  08/06/14 196 lb 6.9 oz (89.1 kg)    Ideal Body Weight:  78.2 kg (kg)  Wt Readings from Last 10 Encounters:  08/06/14 196 lb 6.9 oz  (89.1 kg)  10/05/13 200 lb (90.719 kg)  08/05/13 187 lb 4.8 oz (84.959 kg)    BMI:  Body mass index is 32.69 kg/(m^2).  Estimated Nutritional Needs:  Kcal:  5053-9767  Protein:  85-95 grams  Fluid:  2.2 L/day  Skin:  Reviewed, no issues  Diet Order:  Diet Heart Room service appropriate?: Yes; Fluid consistency:: Thin  EDUCATION NEEDS:  No education needs identified at this time   Intake/Output Summary (Last 24 hours) at 08/08/14 0932 Last data filed at 08/08/14 0900  Gross per 24 hour  Intake    535 ml  Output   2725 ml  Net  -2190 ml    Last BM:  PTA     Jarome Matin, RD, LDN Inpatient Clinical Dietitian Pager # [phone] After hours/weekend pager # [phone]

## 2014-08-08 NOTE — Progress Notes (Signed)
Patient ID: Jacqueline Norman, female   DOB: [birth year], 44 y.o.   MRN: [id number]    Referring Physician(s): Dahlstedt  Subjective:  Pt doing ok; no new c/o; some mild left flank soreness  Allergies: Review of patient's allergies indicates no known allergies.  Medications: Prior to Admission medications   Medication Sig Start Date End Date Taking? Authorizing Provider  baclofen (LIORESAL) 10 MG tablet Take 10 mg by mouth 2 (two) times daily.   Yes Historical Provider, MD  benztropine (COGENTIN) 1 MG tablet Take 1 mg by mouth 2 (two) times daily.   Yes Historical Provider, MD  cholecalciferol (VITAMIN D) 1000 UNITS tablet Take 1,000 Units by mouth every morning.   Yes Historical Provider, MD  docusate sodium (COLACE) 100 MG capsule Take 100 mg by mouth daily as needed for mild constipation.   Yes Historical Provider, MD  ferrous sulfate 325 (65 FE) MG tablet Take 1 tablet (325 mg total) by mouth 3 (three) times daily with meals. 08/05/13  Yes Belkys A Regalado, MD  Interferon Beta-1b (BETASERON/EXTAVIA) 0.3 MG KIT injection Inject 0.3 mg into the skin every other day.   Yes Historical Provider, MD  levothyroxine (SYNTHROID, LEVOTHROID) 50 MCG tablet Take 50 mcg by mouth daily before breakfast.   Yes Historical Provider, MD  omega-3 acid ethyl esters (LOVAZA) 1 G capsule Take 1 g by mouth 2 (two) times daily.    Yes Historical Provider, MD  potassium chloride SA (K-DUR,KLOR-CON) 20 MEQ tablet Take 20 mEq by mouth every morning.   Yes Historical Provider, MD  simvastatin (ZOCOR) 5 MG tablet Take 5 mg by mouth at bedtime.    Yes Historical Provider, MD  ciprofloxacin (CIPRO) 500 MG tablet Take 1 tablet (500 mg total) by mouth 2 (two) times daily. X 7 days to prevent post-op infection 10/08/13   Alexis Frock, MD  oxyCODONE-acetaminophen (PERCOCET/ROXICET) 5-325 MG per tablet Take 1-2 tablets by mouth every 6 (six) hours as needed for moderate pain. Post-operatively Patient not taking: Reported on  08/05/2014 10/08/13   Alexis Frock, MD     Vital Signs: BP 128/91 mmHg  Pulse 92  Temp(Src) 98.2 F (36.8 C) (Oral)  Resp 18  Ht _0  (1.651 m)  Wt 196 lb 6.9 oz (89.1 kg)  BMI 32.69 kg/m2  SpO2 100%  LMP   Physical Exam left PCN intact, dressing dry, site mildly tender, output 550 cc's blood- tinged urine; PCN flushed with 5 cc's sterile NS without difficulty  Imaging: Ct Abdomen Pelvis Wo Contrast  08/05/2014   CLINICAL DATA:  Sepsis.  Leukocytosis.  EXAM: CT ABDOMEN AND PELVIS WITHOUT CONTRAST  TECHNIQUE: Multidetector CT imaging of the abdomen and pelvis was performed following the standard protocol without IV contrast.  COMPARISON:  08/29/2013  FINDINGS: There is a 1.7 cm calculus in the left renal pelvis. There is another 1.7 cm calculus in the lower pole left collecting system. These are new from 08/29/2013. There is moderate left hydronephrosis. There is left perinephric stranding. The left ureter is normal in caliber, with no ureteral calculi.  In the right nephrectomy bed, there is a 2.4 x 4.3 by 3.7 cm complex -appearing collection which could represent residual abscess or residual inflammation. The nephrectomy bed is otherwise unremarkable.  There is cholelithiasis.  There are unremarkable unenhanced appearances of the liver, spleen, pancreas and adrenals.  Bowel is unremarkable.  Uterus and ovaries appear unremarkable.  Abdominal aorta is normal in caliber with mild atherosclerotic calcification.  There is mild  right lung base opacity which could be atelectatic or infectious. There are no effusions.  No significant musculoskeletal lesion is evident. Moderately severe degenerative disc and facet changes are present in the lower lumbar spine. There is a bilateral L5 spondylolysis with minimal spondylolisthesis.  IMPRESSION: 1. New left nephrolithiasis and hydronephrosis. No ureteral calculi are evident. There is a 1.7 cm left renal pelvic calculus and another 1.7 cm left lower pole  collecting system calculus. 2. 2.4 x 3.7 x 4.3 cm right nephrectomy bed collection which could represent residual abscess or inflammation. 3. Cholelithiasis without evidence of an acute process in the biliary system. 4. Mild right lung base opacity due to atelectasis or infection. 5. Incidentally noted spondylolysis and minimal spondylolisthesis at L5. Lower lumbar degenerative disc and facet changes.   Electronically Signed   By: Andreas Newport M.D.   On: 08/05/2014 03:12   Dg Abd 1 View  08/06/2014   CLINICAL DATA:  Decreased output from the left-sided nephrostomy tube ; assess placement  EXAM: ABDOMEN - 1 VIEW  COMPARISON:  None  FINDINGS: A left nephrostomy tube is partially imaged. The tip of the tube overlies the left renal fossa. Its more lateral portion is not included in the field of view.  The bowel gas pattern is unremarkable. There are numerous phleboliths within the pelvis. There are degenerative changes of both hips.  IMPRESSION: The visualized portions of the nephrostomy tube project over the midpole region of the left kidney. Further evaluation with a nephrostogram, CT scanning, or ultrasound could be considered.   Electronically Signed   By: David  Martinique M.D.   On: 08/06/2014 08:53   Ct Head Wo Contrast  08/05/2014   CLINICAL DATA:  Altered mental status. History of multiple sclerosis. Mental status changes began at 19:00.  EXAM: CT HEAD WITHOUT CONTRAST  TECHNIQUE: Contiguous axial images were obtained from the base of the skull through the vertex without intravenous contrast.  COMPARISON:  None.  FINDINGS: There is no intracranial hemorrhage, mass or evidence of acute infarction. There is no extra-axial fluid collection. There is moderate generalized atrophy, greatest in the frontal lobes. There is extensive cerebral white matter hypodensity, greatest in the periventricular regions, nonspecific but consistent with the described history of long-standing demyelinating disease. No acute  intracranial findings are evident. No bony abnormality is evident.  IMPRESSION: Severe white matter hypodensities in both cerebral hemispheres, consistent with the described history of longstanding demyelinating disease. Moderate generalized atrophy, greatest in the frontal lobes. No acute findings are evident.   Electronically Signed   By: Andreas Newport M.D.   On: 08/05/2014 00:41   US Renal  08/05/2014   CLINICAL DATA:  Renal insufficiency.  Right nephrectomy.  EXAM: RENAL / URINARY TRACT ULTRASOUND COMPLETE  COMPARISON:  CT 08/29/2013  FINDINGS: Right Kidney:  Absent.  Prior nephrectomy.  Left Kidney:  Length: 14.6 cm. There is a lower pole left renal calculus with branching morphology suggesting early staghorn formation. No hydronephrosis.  Bladder:  Grossly normal with limited visibility due to low volume.  IMPRESSION: Lower pole left nephrolithiasis with branching morphology suggesting early staghorn. No hydronephrosis.   Electronically Signed   By: Andreas Newport M.D.   On: 08/05/2014 02:52   Dg Chest Port 1 View  08/05/2014   CLINICAL DATA:  Sepsis.  EXAM: PORTABLE CHEST - 1 VIEW  COMPARISON:  02/03/2004  FINDINGS: There is elevation of right hemidiaphragm with adjacent linear opacity, likely atelectasis. Lung volumes are low. Cardiomediastinal contours are normal  for technique. No large pleural effusion or pneumothorax. No pulmonary edema.  IMPRESSION: Hypoventilatory chest with elevation of right hemidiaphragm and adjacent linear opacities, likely atelectasis.   Electronically Signed   By: Jeb Levering M.D.   On: 08/05/2014 02:45   Ir Nephrostomy Placement Left  08/06/2014   CLINICAL DATA:  Left hydronephrosis secondary to obstructing calculus. Previous right nephrectomy for XGPN. Fever.  EXAM: LEFT PERCUTANEOUS NEPHROSTOMY CATHETER PLACEMENT UNDER ULTRASOUND AND FLUOROSCOPIC GUIDANCE  FLUOROSCOPY TIME:  5 minutes, 66 mGy  TECHNIQUE: The procedure, risks (including but not limited to  bleeding, infection, organ damage ), benefits, and alternatives were explained to the patient. Questions regarding the procedure were encouraged and answered. The patient understands and consents to the procedure.  After laterality was marked, the leftFlank region prepped with Betadine, draped in usual sterile fashion, infiltrated locally with 1% lidocaine.Patient was receiving adequate prophylactic antibiotic coverage.  Intravenous Fentanyl and Versed were administered as conscious sedation during continuous cardiorespiratory monitoring by the radiology RN, with a total moderate sedation time of 16 minutes.  Under real-time ultrasound guidance, a 21-gauge trocar needle was advanced into a posterior lower pole calyx. Ultrasound image documentation was saved. Urine spontaneously returned through the needle. Needle was exchanged over a guidewire for transitional dilator. Contrast injection confirmed appropriate positioning. Catheter was exchanged over a guidewire for a 10 French pigtail catheter, formed centrally within the left renal collecting system. Contrast injection confirms appropriate positioning and patency. Catheter secured externally with 0 Prolene suture and placed to external drain bag.  COMPLICATIONS: COMPLICATIONS none  IMPRESSION: 1. Technically successful left percutaneous nephrostomy catheter placement.   Electronically Signed   By: Lucrezia Europe M.D.   On: 08/06/2014 12:40    Labs:  CBC:  Recent Labs  10/07/13 0816 08/04/14 2327 08/05/14 0500 08/06/14 0810  WBC 8.2 11.1* 9.6 7.0  HGB 9.7* 12.4 11.4* 10.4*  HCT 31.4* 37.7 35.9* 32.2*  PLT 220 257 236 232    COAGS:  Recent Labs  08/05/14 0500  INR 1.19  APTT 31    BMP:  Recent Labs  08/04/14 2327 08/05/14 0500 08/06/14 0810 08/07/14 0023  NA 139 138 142 138  K 4.7 4.5 3.8 3.3*  CL 108 109 113* 107  CO2 23 19* 20* 23  GLUCOSE 133* 174* 161* 92  BUN 25* 27* 19 12  CALCIUM 9.5 8.5* 8.5* 8.8*  CREATININE 3.71* 3.98*  2.14* 1.41*  GFRNONAA 14* 13* 27* 45*  GFRAA 16* 15* 31* 52*    LIVER FUNCTION TESTS:  Recent Labs  08/04/14 2327 08/05/14 0500 08/06/14 0810  BILITOT 0.9 1.2 1.1  AST _0 ALT _1 ALKPHOS 54 49 52  PROT 7.8 7.2 7.3  ALBUMIN 3.5 3.0* 2.8*    Assessment and Plan: S/p left PCN 6/28 secondary to obstructive hydronephrosis/stone/sepsis/ARF; afebrile; blood cx's- staph; monitor labs; cont PCN as directed by urology   Signed: D. Rowe Robert 08/08/2014, 10:24 AM   I spent a total of 15 minutes in face to face in clinical consultation/evaluation, greater than 50% of which was counseling/coordinating care for left perc nephrostomy

## 2014-08-08 NOTE — Care Management Note (Signed)
Case Management Note  Patient Details  Name: Jacqueline Norman MRN: [id number] Date of Birth: 05-Oct-1970  Subjective/Objective:                    Action/Plan:d/c plan home w/family. No anticipated d/c needs or orders.   Expected Discharge Date:                  Expected Discharge Plan:  Home/Self Care  In-House Referral:  NA  Discharge planning Services  CM Consult  Post Acute Care Choice:  NA Choice offered to:  NA  DME Arranged:  N/A DME Agency:  NA  HH Arranged:  NA HH Agency:  NA  Status of Service:  In process, will continue to follow  Medicare Important Message Given:  Yes-second notification given Date Medicare IM Given:    Medicare IM give by:    Date Additional Medicare IM Given:    Additional Medicare Important Message give by:     If discussed at Long Length of Stay Meetings, dates discussed:    Additional Comments:  Lanier Clam, RN 08/08/2014, 2:17 PM

## 2014-08-08 NOTE — Progress Notes (Signed)
Subjective:  1 - Acute Renal Failure / Obstruction of Solitary Left Kidney - CR 3.98 on admit 6/26 up from baseline <1 with hdro of solitary kidney from large volume stone. Now s/p neph tube 6/27. 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   2 - Urosepsis - fever, tachycardia, bacteruria on presentationthis admission c/w likely urosepsis. BCX 6/26 with cag neg staph x2 / pending. UCX 6/26 GNR"s / pending. UCX 6/22 proteus. On empiric Vanc + Zosyn.  3 - Large Left Renal Stone - multifocal left renal stone, total volume about 2.5 cm by CT 07/2014. This is new since last imaging 2015 at time of right nephrectomy.   Today Treniece is continuing dramatic clinical improvement. Remain afebrile and mental status at baseline.   Objective: Vital signs in last 24 hours: Temp:  [97.9 F (36.6 C)-98.2 F (36.8 C)] 98.2 F (36.8 C) (06/30 0510) Pulse Rate:  [90-92] 92 (06/30 0510) Resp:  [18] 18 (06/30 0510) BP: (117-128)/(78-91) 128/91 mmHg (06/30 0510) SpO2:  [100 %] 100 % (06/30 0510) Last BM Date: 08/04/14  Intake/Output from previous day: 06/29 0701 - 06/30 0700 In: 775 [P.O.:720; IV Piggyback:50] Out: 2525 [Urine:2525] Intake/Output this shift:    General appearance: alert, cooperative and appears stated age Head: Normocephalic, without obvious abnormality, atraumatic Throat: lips, mucosa, and tongue normal; teeth and gums normal Neck: supple, symmetrical, trachea midline Back: symmetric, no curvature. ROM normal. No CVA tenderness., left neph tube with clear urine.  Resp: non-labored GI: soft, non-tender; bowel sounds normal; no masses,  no organomegaly Pelvic: external genitalia normal and foley c/d/i with clear urine.  Extremities: UE mod contractures as per basleine.  Lymph nodes: Cervical, supraclavicular, and axillary nodes normal. Neurologic: Mental status: Alert, oriented, thought content appropriate,  AOx3, stable global stigmata of demylenating disease.   Lab Results:   Recent Labs  08/06/14 0810  WBC 7.0  HGB 10.4*  HCT 32.2*  PLT 232   BMET  Recent Labs  08/06/14 0810 08/07/14 0023  NA 142 138  K 3.8 3.3*  CL 113* 107  CO2 20* 23  GLUCOSE 161* 92  BUN 19 12  CREATININE 2.14* 1.41*  CALCIUM 8.5* 8.8*   PT/INR No results for input(s): LABPROT, INR in the last 72 hours. ABG No results for input(s): PHART, HCO3 in the last 72 hours.  Invalid input(s): PCO2, PO2  Studies/Results: Dg Abd 1 View  08/06/2014   CLINICAL DATA:  Decreased output from the left-sided nephrostomy tube ; assess placement  EXAM: ABDOMEN - 1 VIEW  COMPARISON:  None  FINDINGS: A left nephrostomy tube is partially imaged. The tip of the tube overlies the left renal fossa. Its more lateral portion is not included in the field of view.  The bowel gas pattern is unremarkable. There are numerous phleboliths within the pelvis. There are degenerative changes of both hips.  IMPRESSION: The visualized portions of the nephrostomy tube project over the midpole region of the left kidney. Further evaluation with a nephrostogram, CT scanning, or ultrasound could be considered.   Electronically Signed   By: David  [place] M.D.   On: 08/06/2014 08:53    Anti-infectives: Anti-infectives    Start     Dose/Rate Route Frequency Ordered Stop   08/07/14 1800  vancomycin (VANCOCIN) IVPB 750 mg/150 ml premix     750 mg 150 mL/hr over 60 Minutes Intravenous Every 12 hours 08/07/14 1048     08/06/14 0600  vancomycin (  VANCOCIN) IVPB 1000 mg/200 mL premix  Status:  Discontinued     1,000 mg 200 mL/hr over 60 Minutes Intravenous Every 24 hours 08/05/14 0420 08/07/14 1048   08/05/14 0345  piperacillin-tazobactam (ZOSYN) IVPB 3.375 g     3.375 g 12.5 mL/hr over 240 Minutes Intravenous 3 times per day 08/05/14 0343     08/05/14 0345  vancomycin (VANCOCIN) IVPB 1000 mg/200 mL premix     1,000 mg 200 mL/hr over 60 Minutes  Intravenous  Once 08/05/14 0343 08/05/14 0537   08/05/14 0130  cefTRIAXone (ROCEPHIN) 1 g in dextrose 5 % 50 mL IVPB     1 g 100 mL/hr over 30 Minutes Intravenous  Once 08/05/14 0116 08/05/14 0313      Assessment/Plan:  1 - Acute Renal Failure / Obstruction of Solitary Left Kidney - now s/p neph tube and GFR back to baseline.  Ok for DC foley at anytime from GU perspective.   2 - Urosepsis - agree with current ABX pending final CX data. Suspect CONS in blood contaminant.   3 - Large Left Renal Stone - will need left percutantous surgery in elective setting after clears infectiuos parameters and substantial GFR recovery, not this admission.   4 - Will now follow prn. We will arrange for GU follow up in few weeks at our office to discuss details of stone surgery.   [facility],  08/08/2014

## 2014-08-08 NOTE — Progress Notes (Signed)
Notified Dr. Cena Benton regarding patient's home meds.  Mother is asking when to restart them.

## 2014-08-08 NOTE — Progress Notes (Signed)
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 Patient presented after being difficult to arouse in somnolent. Found to have sepsis and is currently being treated for bacteremia and gram-negative UTI. ID consulted and urology also on board.    Assessment/Plan: Principal Problem:   Acute encephalopathy -Multifactorial most likely related to recent infectious etiology in patient with history of MS and acute renal failure. - EEG obtained and reporting moderate to severe generalized nonspecific cerebral dysfunction (encephalopathy) - improving daily    Sepsis - Improving on current antibiotics regimen  Bacteremia - Patient is currently on vancomycin will await final culture sensitivity reports - Echocardiogram reports no vegetations. - ID consulted today  Hypokalemia - will reassess next am.  Hypothyroidism - continue home synthroid regimen.  Active Problems:   Multiple sclerosis - continue home regimen given improvement in mentation.    Acute renal failure - In context of patient with right nephrectomy approximately a year ago for xanthogranulomatous pyelonephritis. Who presented to the hospital with 17 mm left UPJ stone. IR consulted for left percutaneous nephrostomy - Serum creatinine trending down    Hydronephrosis with renal and ureteral calculus obstruction - Urology on board. Please see above  Code Status: full Family Communication: discussed with fiancee  Disposition Plan: Pending improvement in condition.   Consultants:  Urology  IR  Procedures: left percutaneous nephrostomy  Antibiotics:  Vanc and Zosyn  HPI/Subjective: Pt feels better and is more responsive. Has no new complaints.  Objective: Filed  Vitals:   08/08/14 1300  BP: 115/94  Pulse: 90  Temp: 98 F (36.7 C)  Resp:     Intake/Output Summary (Last 24 hours) at 08/08/14 1759 Last data filed at 08/08/14 1700  Gross per 24 hour  Intake      0 ml  Output   2400 ml  Net  -2400 ml   Filed Weights   08/05/14 0330 08/06/14 0600 08/06/14 1221  Weight: 89.8 kg (197 lb 15.6 oz) 89.5 kg (197 lb 5 oz) 89.1 kg (196 lb 6.9 oz)    Exam:   General:  Pt in nad, alert and awake   Cardiovascular: rrr, no mrg  Respiratory: cta bl, no wheezes  Abdomen: soft, NT, ND  Musculoskeletal: no cyanosis or clubbing   Data Reviewed: Basic Metabolic Panel:  Recent Labs Lab 08/04/14 2327 08/05/14 0500 08/06/14 0810 08/07/14 0023  NA 139 138 142 138  K 4.7 4.5 3.8 3.3*  CL 108 109 113* 107  CO2 23 19* 20* 23  GLUCOSE 133* 174* 161* 92  BUN 25* 27* 19 12  CREATININE 3.71* 3.98* 2.14* 1.41*  CALCIUM 9.5 8.5* 8.5* 8.8*   Liver Function Tests:  Recent Labs Lab 08/04/14 2327 08/05/14 0500 08/06/14 0810  AST 21 18 17   ALT 18 15 16   ALKPHOS 54 49 52  BILITOT 0.9 1.2 1.1  PROT 7.8 7.2 7.3  ALBUMIN 3.5 3.0* 2.8*   No results for input(s): LIPASE, AMYLASE in the last 168 hours. No results for input(s): AMMONIA in the last 168 hours. CBC:  Recent Labs Lab 08/04/14 2327 08/05/14 0500 08/06/14 0810  WBC 11.1* 9.6 7.0  NEUTROABS  --  7.4  --   HGB 12.4 11.4* 10.4*  HCT 37.7 35.9* 32.2*  MCV 86.5 88.0 88.0  PLT 257 236 232  Cardiac Enzymes: No results for input(s): CKTOTAL, CKMB, CKMBINDEX, TROPONINI in the last 168 hours. BNP (last 3 results) No results for input(s): BNP in the last 8760 hours.  ProBNP (last 3 results) No results for input(s): PROBNP in the last 8760 hours.  CBG:  Recent Labs Lab 08/08/14 0001 08/08/14 0418 08/08/14 0746 08/08/14 1156 08/08/14 1620  GLUCAP 135* 159* 103* 129* 102*    Recent Results (from the past 240 hour(s))  Urine culture     Status: None (Preliminary result)    Collection Time: 08/04/14 11:28 PM  Result Value Ref Range Status   Specimen Description URINE, CLEAN CATCH  Final   Special Requests NONE  Final   Culture   Final    >=100,000 COLONIES/mL GRAM NEGATIVE RODS Performed at [facility]    Report Status PENDING  Incomplete  Blood Culture (routine x 2)     Status: None (Preliminary result)   Collection Time: 08/05/14 12:33 AM  Result Value Ref Range Status   Specimen Description LEFT ANTECUBITAL  Final   Special Requests BOTTLES DRAWN AEROBIC AND ANAEROBIC  Final   Culture  Setup Time   Final    GRAM POSITIVE COCCI IN CLUSTERS IN BOTH AEROBIC AND ANAEROBIC BOTTLES CRITICAL RESULT CALLED TO, READ BACK BY AND VERIFIED WITH: M. REEVES RN [phone] [id number] GREEN R CONFIRMED BY R. HOLMES    Culture   Final    STAPHYLOCOCCUS SPECIES (COAGULASE NEGATIVE) Performed at [facility]    Report Status PENDING  Incomplete  Blood Culture (routine x 2)     Status: None (Preliminary result)   Collection Time: 08/05/14 12:33 AM  Result Value Ref Range Status   Specimen Description BLOOD RIGHT HAND  Final   Special Requests BOTTLES DRAWN AEROBIC AND ANAEROBIC  Final   Culture  Setup Time   Final    GRAM POSITIVE COCCI IN CLUSTERS IN BOTH AEROBIC AND ANAEROBIC BOTTLES GRAM STAIN REVIEWED-AGREE WITH RESULT MVESTAL  CRITICAL RESULT CALLED TO, READ BACK BY AND VERIFIED WITH: L FREI,RN AT 1006 08/06/14 BY KBARR    Culture   Final    STAPHYLOCOCCUS SPECIES (COAGULASE NEGATIVE) Performed at [facility]    Report Status PENDING  Incomplete  MRSA PCR Screening     Status: None   Collection Time: 08/05/14  4:18 AM  Result Value Ref Range Status   MRSA by PCR NEGATIVE NEGATIVE Final    Comment:        The GeneXpert MRSA Assay (FDA approved for NASAL specimens only), is one component of a comprehensive MRSA colonization surveillance program. It is not intended to diagnose MRSA infection nor to guide or monitor treatment for MRSA  infections.   Urine culture     Status: None   Collection Time: 08/05/14 11:23 AM  Result Value Ref Range Status   Specimen Description URINE, RANDOM from kidney  Final   Special Requests NONE  Final   Culture   Final    NO GROWTH 1 DAY Performed at [facility]    Report Status 08/06/2014 FINAL  Final     Studies: No results found.  Scheduled Meds: . antiseptic oral rinse  7 mL Mouth Rinse BID  . levothyroxine  25 mcg Intravenous Daily  . piperacillin-tazobactam (ZOSYN)  IV  3.375 g Intravenous 3 times per day  . vancomycin  750 mg Intravenous Q12H   Continuous Infusions:    Time spent: > 35 minutes    , [employer]  [facility] Pager [phone] If 7PM-7AM, please contact night-coverage at [email or web address], password [facility] 08/08/2014, 5:59 PM  LOS: 3 days

## 2014-08-09 DIAGNOSIS — E876 Hypokalemia: Secondary | ICD-10-CM

## 2014-08-09 DIAGNOSIS — G9340 Encephalopathy, unspecified: Secondary | ICD-10-CM

## 2014-08-09 DIAGNOSIS — N200 Calculus of kidney: Secondary | ICD-10-CM | POA: Diagnosis present

## 2014-08-09 DIAGNOSIS — N179 Acute kidney failure, unspecified: Secondary | ICD-10-CM

## 2014-08-09 DIAGNOSIS — B957 Other staphylococcus as the cause of diseases classified elsewhere: Secondary | ICD-10-CM | POA: Diagnosis present

## 2014-08-09 DIAGNOSIS — N151 Renal and perinephric abscess: Secondary | ICD-10-CM

## 2014-08-09 DIAGNOSIS — A419 Sepsis, unspecified organism: Secondary | ICD-10-CM

## 2014-08-09 DIAGNOSIS — N132 Hydronephrosis with renal and ureteral calculous obstruction: Secondary | ICD-10-CM

## 2014-08-09 DIAGNOSIS — G35 Multiple sclerosis: Secondary | ICD-10-CM

## 2014-08-09 DIAGNOSIS — R7881 Bacteremia: Secondary | ICD-10-CM

## 2014-08-09 DIAGNOSIS — N12 Tubulo-interstitial nephritis, not specified as acute or chronic: Secondary | ICD-10-CM

## 2014-08-09 LAB — GLUCOSE, CAPILLARY
GLUCOSE-CAPILLARY: 100 mg/dL — AB (ref 65–99)
GLUCOSE-CAPILLARY: 148 mg/dL — AB (ref 65–99)
GLUCOSE-CAPILLARY: 101 mg/dL — AB (ref 65–99)
Glucose-Capillary: 87 mg/dL (ref 65–99)
Glucose-Capillary: 96 mg/dL (ref 65–99)
Glucose-Capillary: 98 mg/dL (ref 65–99)

## 2014-08-09 LAB — BASIC METABOLIC PANEL
ANION GAP: 10 (ref 5–15)
BUN: 8 mg/dL (ref 6–20)
CO2: 26 mmol/L (ref 22–32)
Calcium: 9.2 mg/dL (ref 8.9–10.3)
Chloride: 102 mmol/L (ref 101–111)
Creatinine, Ser: 1.21 mg/dL — ABNORMAL HIGH (ref 0.44–1.00)
GFR calc non Af Amer: 54 mL/min — ABNORMAL LOW (ref >60)
Glucose, Bld: 116 mg/dL — ABNORMAL HIGH (ref 65–99)
POTASSIUM: 2.9 mmol/L — AB (ref 3.5–5.1)
Sodium: 138 mmol/L (ref 135–145)

## 2014-08-09 LAB — CBC
HCT: 33.3 % — ABNORMAL LOW (ref 36.0–46.0)
HEMOGLOBIN: 11.2 g/dL — AB (ref 12.0–15.0)
MCH: 28.9 pg (ref 26.0–34.0)
MCHC: 33.6 g/dL (ref 30.0–36.0)
MCV: 86 fL (ref 78.0–100.0)
Platelets: 254 10*3/uL (ref 150–400)
RBC: 3.87 MIL/uL (ref 3.87–5.11)
RDW: 16.2 % — ABNORMAL HIGH (ref 11.5–15.5)
WBC: 6.5 10*3/uL (ref 4.0–10.5)

## 2014-08-09 LAB — URINE CULTURE: Culture: >=100000

## 2014-08-09 LAB — MAGNESIUM: Magnesium: 2.1 mg/dL (ref 1.7–2.4)

## 2014-08-09 LAB — VANCOMYCIN, TROUGH: Vancomycin Tr: 21 ug/mL — ABNORMAL HIGH (ref 10.0–20.0)

## 2014-08-09 MED ORDER — CEFAZOLIN SODIUM 1-5 GM-% IV SOLN
1.0000 g | Freq: Three times a day (TID) | INTRAVENOUS | Status: DC
  Administered 2014-08-09 – 2014-08-11 (×7): 1 g via INTRAVENOUS
  Filled 2014-08-09 (×9): qty 50

## 2014-08-09 MED ORDER — INTERFERON BETA-1B 0.3 MG ~~LOC~~ KIT
0.3000 mg | PACK | SUBCUTANEOUS | Status: DC
  Administered 2014-08-09: 0.3 mg via SUBCUTANEOUS

## 2014-08-09 MED ORDER — POTASSIUM CHLORIDE 10 MEQ/100ML IV SOLN
10.0000 meq | INTRAVENOUS | Status: AC
  Administered 2014-08-09 (×5): 10 meq via INTRAVENOUS
  Filled 2014-08-09 (×5): qty 100

## 2014-08-09 MED ORDER — INTERFERON BETA-1B 0.3 MG ~~LOC~~ KIT
0.3000 mg | PACK | SUBCUTANEOUS | Status: DC
  Filled 2014-08-09: qty 0.3

## 2014-08-09 MED ORDER — VANCOMYCIN HCL 10 G IV SOLR
1500.0000 mg | INTRAVENOUS | Status: DC
  Administered 2014-08-10 – 2014-08-11 (×2): 1500 mg via INTRAVENOUS
  Filled 2014-08-09 (×2): qty 1500

## 2014-08-09 MED ORDER — POTASSIUM CHLORIDE CRYS ER 20 MEQ PO TBCR
20.0000 meq | EXTENDED_RELEASE_TABLET | Freq: Every morning | ORAL | Status: DC
  Administered 2014-08-10 – 2014-08-11 (×2): 20 meq via ORAL
  Filled 2014-08-09 (×2): qty 1

## 2014-08-09 NOTE — Progress Notes (Signed)
INFECTIOUS DISEASE PROGRESS NOTE  ID: Jacqueline Norman is a 44 y.o. female with  Principal Problem:   Acute encephalopathy Active Problems:   Multiple sclerosis   Sepsis   Acute renal failure   Hydronephrosis with renal and ureteral calculus obstruction  Subjective: Having multiple formed BM. No diarrhea  Abtx:  Anti-infectives    Start     Dose/Rate Route Frequency Ordered Stop   08/07/14 1800  vancomycin (VANCOCIN) IVPB 750 mg/150 ml premix     750 mg 150 mL/hr over 60 Minutes Intravenous Every 12 hours 08/07/14 1048     08/06/14 0600  vancomycin (VANCOCIN) IVPB 1000 mg/200 mL premix  Status:  Discontinued     1,000 mg 200 mL/hr over 60 Minutes Intravenous Every 24 hours 08/05/14 0420 08/07/14 1048   08/05/14 0345  piperacillin-tazobactam (ZOSYN) IVPB 3.375 g     3.375 g 12.5 mL/hr over 240 Minutes Intravenous 3 times per day 08/05/14 0343     08/05/14 0345  vancomycin (VANCOCIN) IVPB 1000 mg/200 mL premix     1,000 mg 200 mL/hr over 60 Minutes Intravenous  Once 08/05/14 0343 08/05/14 0537   08/05/14 0130  cefTRIAXone (ROCEPHIN) 1 g in dextrose 5 % 50 mL IVPB     1 g 100 mL/hr over 30 Minutes Intravenous  Once 08/05/14 0116 08/05/14 0313      Medications:  Scheduled: . antiseptic oral rinse  7 mL Mouth Rinse BID  . baclofen  10 mg Oral BID  . benztropine  1 mg Oral BID  . ferrous sulfate  325 mg Oral TID WC  . levothyroxine  50 mcg Oral QAC breakfast  . omega-3 acid ethyl esters  1 g Oral BID  . piperacillin-tazobactam (ZOSYN)  IV  3.375 g Intravenous 3 times per day  . simvastatin  5 mg Oral QHS  . vancomycin  750 mg Intravenous Q12H    Objective: Vital signs in last 24 hours: Temp:  [97.6 F (36.4 C)-98.1 F (36.7 C)] 97.6 F (36.4 C) (07/01 1448) Pulse Rate:  [83-105] 83 (07/01 1448) Resp:  [18-20] 20 (07/01 1448) BP: (121-136)/(81-94) 121/81 mmHg (07/01 1448) SpO2:  [98 %-100 %] 100 % (07/01 1448)   General appearance: alert, cooperative and no  distress Resp: clear to auscultation bilaterally Cardio: regular rate and rhythm GI: normal findings: bowel sounds normal and soft, non-tender  Lab Results  Recent Labs  08/07/14 0023 08/09/14 0355  WBC  --  6.5  HGB  --  11.2*  HCT  --  33.3*  NA 138 138  K 3.3* 2.9*  CL 107 102  CO2 23 26  BUN 12 8  CREATININE 1.41* 1.21*   Liver Panel No results for input(s): PROT, ALBUMIN, AST, ALT, ALKPHOS, BILITOT, BILIDIR, IBILI in the last 72 hours. Sedimentation Rate No results for input(s): ESRSEDRATE in the last 72 hours. C-Reactive Protein No results for input(s): CRP in the last 72 hours.  Microbiology: Recent Results (from the past 240 hour(s))  Urine culture     Status: None   Collection Time: 08/04/14 11:28 PM  Result Value Ref Range Status   Specimen Description URINE, CLEAN CATCH  Final   Special Requests NONE  Final   Culture   Final    >=100,000 COLONIES/mL ESCHERICHIA COLI Performed at [facility]    Report Status 08/09/2014 FINAL  Final   Organism ID, Bacteria ESCHERICHIA COLI  Final      Susceptibility   Escherichia coli - MIC*  AMPICILLIN <=2 SENSITIVE Sensitive     CEFAZOLIN <=4 SENSITIVE Sensitive     CEFTRIAXONE <=1 SENSITIVE Sensitive     CIPROFLOXACIN <=0.25 SENSITIVE Sensitive     GENTAMICIN <=1 SENSITIVE Sensitive     IMIPENEM <=0.25 SENSITIVE Sensitive     NITROFURANTOIN <=16 SENSITIVE Sensitive     TRIMETH/SULFA <=20 SENSITIVE Sensitive     AMPICILLIN/SULBACTAM <=2 SENSITIVE Sensitive     PIP/TAZO <=4 SENSITIVE Sensitive     * >=100,000 COLONIES/mL ESCHERICHIA COLI  Blood Culture (routine x 2)     Status: None (Preliminary result)   Collection Time: 08/05/14 12:33 AM  Result Value Ref Range Status   Specimen Description LEFT ANTECUBITAL  Final   Special Requests BOTTLES DRAWN AEROBIC AND ANAEROBIC  Final   Culture  Setup Time   Final    GRAM POSITIVE COCCI IN CLUSTERS IN BOTH AEROBIC AND ANAEROBIC BOTTLES CRITICAL RESULT CALLED  TO, READ BACK BY AND VERIFIED WITH: M. REEVES RN [phone] 0134 GREEN R CONFIRMED BY R. HOLMES    Culture   Final    STAPHYLOCOCCUS SPECIES (COAGULASE NEGATIVE) Performed at [facility]    Report Status PENDING  Incomplete  Blood Culture (routine x 2)     Status: None (Preliminary result)   Collection Time: 08/05/14 12:33 AM  Result Value Ref Range Status   Specimen Description BLOOD RIGHT HAND  Final   Special Requests BOTTLES DRAWN AEROBIC AND ANAEROBIC  Final   Culture  Setup Time   Final    GRAM POSITIVE COCCI IN CLUSTERS IN BOTH AEROBIC AND ANAEROBIC BOTTLES GRAM STAIN REVIEWED-AGREE WITH RESULT MVESTAL  CRITICAL RESULT CALLED TO, READ BACK BY AND VERIFIED WITH: L FREI,RN AT 1006 08/06/14 BY KBARR    Culture   Final    STAPHYLOCOCCUS SPECIES (COAGULASE NEGATIVE) Performed at [facility]    Report Status PENDING  Incomplete  MRSA PCR Screening     Status: None   Collection Time: 08/05/14  4:18 AM  Result Value Ref Range Status   MRSA by PCR NEGATIVE NEGATIVE Final    Comment:        The GeneXpert MRSA Assay (FDA approved for NASAL specimens only), is one component of a comprehensive MRSA colonization surveillance program. It is not intended to diagnose MRSA infection nor to guide or monitor treatment for MRSA infections.   Urine culture     Status: None   Collection Time: 08/05/14 11:23 AM  Result Value Ref Range Status   Specimen Description URINE, RANDOM from kidney  Final   Special Requests NONE  Final   Culture   Final    NO GROWTH 1 DAY Performed at [facility]    Report Status 08/06/2014 FINAL  Final    Studies/Results: No results found.   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  Possible she could be on 1 anbx if this is a sensitive organism.  Again, plan for her to be on po anbx til she has stone removed.  Primary to address K+ She could go home on cipro/vanco pending sensi however she wishes to stay til we have sensi      Johny Sax Infectious Diseases (pager) [phone] [email or web address] 08/09/2014, 2:55 PM  LOS: 4 days

## 2014-08-09 NOTE — Progress Notes (Signed)
Progress Note   Jacqueline Norman KNL:[id number] DOB: 1970/06/28 DOA: 08/04/2014 PCP: Florentina Jenny, MD   Brief Narrative:   Jacqueline Norman is an 44 y.o. 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  Assessment/Plan:   Principal Problem:   Sepsis secondary to coagulase-negative Staphylococcus bacteremia and Escherichia coli UTI/ pyelonephritis/ perinephric abscess - CT scan done on admission showed perinephric stranding. Urine culture positive for Escherichia coli. - Blood cultures positive for coagulase-negative Staphylococcus, sensitivities pending. - Continue vancomycin and Ancef. - She will need to remain on oral antibiotics until her kidney stone has been removed. - She will need to be followed closely to ensure that the fluid collection seen on CT resolves without the need for drainage.  Active Problems:   Acute renal failure with obstruction of solitary left kidney secondary to a large left renal stone /left hydronephrosis - CT of the abdomen done on admission showed a 17 mm left UPJ stone as well as left lower pole calculus.  - Baseline creatinine less than 1, creatinine on admission 3.98. - Underwent placement of a nephrectomy tube 08/05/14, creatinine has steadily improved.    Multiple sclerosis - Continue interferon beta and baclofen.    Hypothyroidism - Continue Synthroid.    Acute encephalopathy - EEG unremarkable. Likely secondary to sepsis syndrome. Resolved.    Hypokalemia - Give 5 runs of potassium and check magnesium.    DVT Prophylaxis - Continue SCDs.  Family Communication: Mother updated at the bedside. Disposition Plan: Home when culture and sensitivities finalized, likely in the next 24 hours. Code Status:     Code Status Orders        Start     Ordered   08/05/14 1905  Full  code   Continuous     08/05/14 1904        IV Access:    Peripheral IV   Procedures and diagnostic studies:   Ct Abdomen Pelvis Wo Contrast  08/05/2014   CLINICAL DATA:  Sepsis.  Leukocytosis.  EXAM: CT ABDOMEN AND PELVIS WITHOUT CONTRAST  TECHNIQUE: Multidetector CT imaging of the abdomen and pelvis was performed following the standard protocol without IV contrast.  COMPARISON:  08/29/2013  FINDINGS: There is a 1.7 cm calculus in the left renal pelvis. There is another 1.7 cm calculus in the lower pole left collecting system. These are new from 08/29/2013. There is moderate left hydronephrosis. There is left perinephric stranding. The left ureter is normal in caliber, with no ureteral calculi.  In the right nephrectomy bed, there is a 2.4 x 4.3 by 3.7 cm complex -appearing collection which could represent residual abscess or residual inflammation. The nephrectomy bed is otherwise unremarkable.  There is cholelithiasis.  There are unremarkable unenhanced appearances of the liver, spleen, pancreas and adrenals.  Bowel is unremarkable.  Uterus and ovaries appear unremarkable.  Abdominal aorta is normal in caliber with mild atherosclerotic calcification.  There is mild right lung base opacity which could be atelectatic or infectious. There are no effusions.  No significant musculoskeletal lesion is evident. Moderately severe degenerative disc and facet changes are present in the lower lumbar spine. There is a bilateral L5 spondylolysis with minimal spondylolisthesis.  IMPRESSION: 1. New left nephrolithiasis and hydronephrosis. No ureteral calculi are evident. There is a 1.7 cm left renal pelvic calculus and another 1.7 cm left lower pole collecting  system calculus. 2. 2.4 x 3.7 x 4.3 cm right nephrectomy bed collection which could represent residual abscess or inflammation. 3. Cholelithiasis without evidence of an acute process in the biliary system. 4. Mild right lung base opacity due to atelectasis or  infection. 5. Incidentally noted spondylolysis and minimal spondylolisthesis at L5. Lower lumbar degenerative disc and facet changes.   Electronically Signed   By: Ellery Plunk M.D.   On: 08/05/2014 03:12   Dg Abd 1 View  08/06/2014   CLINICAL DATA:  Decreased output from the left-sided nephrostomy tube ; assess placement  EXAM: ABDOMEN - 1 VIEW  COMPARISON:  None  FINDINGS: A left nephrostomy tube is partially imaged. The tip of the tube overlies the left renal fossa. Its more lateral portion is not included in the field of view.  The bowel gas pattern is unremarkable. There are numerous phleboliths within the pelvis. There are degenerative changes of both hips.  IMPRESSION: The visualized portions of the nephrostomy tube project over the midpole region of the left kidney. Further evaluation with a nephrostogram, CT scanning, or ultrasound could be considered.   Electronically Signed   By: David  [place] M.D.   On: 08/06/2014 08:53   Ct Head Wo Contrast  08/05/2014   CLINICAL DATA:  Altered mental status. History of multiple sclerosis. Mental status changes began at 19:00.  EXAM: CT HEAD WITHOUT CONTRAST  TECHNIQUE: Contiguous axial images were obtained from the base of the skull through the vertex without intravenous contrast.  COMPARISON:  None.  FINDINGS: There is no intracranial hemorrhage, mass or evidence of acute infarction. There is no extra-axial fluid collection. There is moderate generalized atrophy, greatest in the frontal lobes. There is extensive cerebral white matter hypodensity, greatest in the periventricular regions, nonspecific but consistent with the described history of long-standing demyelinating disease. No acute intracranial findings are evident. No bony abnormality is evident.  IMPRESSION: Severe white matter hypodensities in both cerebral hemispheres, consistent with the described history of longstanding demyelinating disease. Moderate generalized atrophy, greatest in the frontal  lobes. No acute findings are evident.   Electronically Signed   By: Ellery Plunk M.D.   On: 08/05/2014 00:41   US Renal  08/05/2014   CLINICAL DATA:  Renal insufficiency.  Right nephrectomy.  EXAM: RENAL / URINARY TRACT ULTRASOUND COMPLETE  COMPARISON:  CT 08/29/2013  FINDINGS: Right Kidney:  Absent.  Prior nephrectomy.  Left Kidney:  Length: 14.6 cm. There is a lower pole left renal calculus with branching morphology suggesting early staghorn formation. No hydronephrosis.  Bladder:  Grossly normal with limited visibility due to low volume.  IMPRESSION: Lower pole left nephrolithiasis with branching morphology suggesting early staghorn. No hydronephrosis.   Electronically Signed   By: Ellery Plunk M.D.   On: 08/05/2014 02:52   Dg Chest Port 1 View  08/05/2014   CLINICAL DATA:  Sepsis.  EXAM: PORTABLE CHEST - 1 VIEW  COMPARISON:  02/03/2004  FINDINGS: There is elevation of right hemidiaphragm with adjacent linear opacity, likely atelectasis. Lung volumes are low. Cardiomediastinal contours are normal for technique. No large pleural effusion or pneumothorax. No pulmonary edema.  IMPRESSION: Hypoventilatory chest with elevation of right hemidiaphragm and adjacent linear opacities, likely atelectasis.   Electronically Signed   By: Rubye Oaks M.D.   On: 08/05/2014 02:45   Ir Nephrostomy Placement Left  08/06/2014   CLINICAL DATA:  Left hydronephrosis secondary to obstructing calculus. Previous right nephrectomy for XGPN. Fever.  EXAM: LEFT PERCUTANEOUS NEPHROSTOMY CATHETER PLACEMENT  UNDER ULTRASOUND AND FLUOROSCOPIC GUIDANCE  FLUOROSCOPY TIME:  5 minutes, 66 mGy  TECHNIQUE: The procedure, risks (including but not limited to bleeding, infection, organ damage ), benefits, and alternatives were explained to the patient. Questions regarding the procedure were encouraged and answered. The patient understands and consents to the procedure.  After laterality was marked, the leftFlank region prepped with  Betadine, draped in usual sterile fashion, infiltrated locally with 1% lidocaine.Patient was receiving adequate prophylactic antibiotic coverage.  Intravenous Fentanyl and Versed were administered as conscious sedation during continuous cardiorespiratory monitoring by the radiology RN, with a total moderate sedation time of 16 minutes.  Under real-time ultrasound guidance, a 21-gauge trocar needle was advanced into a posterior lower pole calyx. Ultrasound image documentation was saved. Urine spontaneously returned through the needle. Needle was exchanged over a guidewire for transitional dilator. Contrast injection confirmed appropriate positioning. Catheter was exchanged over a guidewire for a 10 French pigtail catheter, formed centrally within the left renal collecting system. Contrast injection confirms appropriate positioning and patency. Catheter secured externally with 0 Prolene suture and placed to external drain bag.  COMPLICATIONS: COMPLICATIONS none  IMPRESSION: 1. Technically successful left percutaneous nephrostomy catheter placement.   Electronically Signed   By: Corlis Leak M.D.   On: 08/06/2014 12:40     Medical Consultants:    Infectious Disease  Urology  Anti-Infectives:    Vancomycin 08/05/14--->  Zosyn 08/05/14--->08/09/14  Ancef 08/09/14--->  Subjective:   Kathlee Nations Falwell denies pain, dyspnea, cough, nausea, or vomiting. Bowels are moving. No diarrhea.  Objective:    Filed Vitals:   08/08/14 1300 08/08/14 2023 08/09/14 0410 08/09/14 1448  BP: 115/94 124/82 136/94 121/81  Pulse: 90 105 102 83  Temp: 98 F (36.7 C) 98.1 F (36.7 C) 98.1 F (36.7 C) 97.6 F (36.4 C)  TempSrc: Oral Oral Oral Oral  Resp:  19 18 20   Height:      Weight:      SpO2: 100% 99% 98% 100%    Intake/Output Summary (Last 24 hours) at 08/09/14 1626 Last data filed at 08/09/14 1300  Gross per 24 hour  Intake   1040 ml  Output   1475 ml  Net   -435 ml    Exam: Gen:  NAD,  alert Cardiovascular:  RRR, No M/R/G Respiratory:  Lungs CTAB Gastrointestinal:  Abdomen soft, NT/ND, + BS Extremities:  No C/E/C Neuro: Generalized weakness   Data Reviewed:    Labs: Basic Metabolic Panel:  Recent Labs Lab 08/04/14 2327 08/05/14 0500 08/06/14 0810 08/07/14 0023 08/09/14 0335 08/09/14 0355  NA 139 138 142 138  --  138  K 4.7 4.5 3.8 3.3*  --  2.9*  CL 108 109 113* 107  --  102  CO2 23 19* 20* 23  --  26  GLUCOSE 133* 174* 161* 92  --  116*  BUN 25* 27* 19 12  --  8  CREATININE 3.71* 3.98* 2.14* 1.41*  --  1.21*  CALCIUM 9.5 8.5* 8.5* 8.8*  --  9.2  MG  --   --   --   --  2.1  --    GFR Estimated Creatinine Clearance: 66.1 mL/min (by C-G formula based on Cr of 1.21). Liver Function Tests:  Recent Labs Lab 08/04/14 2327 08/05/14 0500 08/06/14 0810  AST 21 18 17   ALT 18 15 16   ALKPHOS 54 49 52  BILITOT 0.9 1.2 1.1  PROT 7.8 7.2 7.3  ALBUMIN 3.5 3.0* 2.8*   Coagulation  profile  Recent Labs Lab 08/05/14 0500  INR 1.19    CBC:  Recent Labs Lab 08/04/14 2327 08/05/14 0500 08/06/14 0810 08/09/14 0355  WBC 11.1* 9.6 7.0 6.5  NEUTROABS  --  7.4  --   --   HGB 12.4 11.4* 10.4* 11.2*  HCT 37.7 35.9* 32.2* 33.3*  MCV 86.5 88.0 88.0 86.0  PLT 257 236 232 254   CBG:  Recent Labs Lab 08/08/14 2334 08/09/14 0409 08/09/14 0753 08/09/14 1210 08/09/14 1614  GLUCAP 142* 100* 148* 87 101*   Sepsis Labs:  Recent Labs Lab 08/04/14 2327 08/04/14 2341 08/05/14 0500 08/06/14 0810 08/09/14 0355  PROCALCITON  --   --  0.67  --   --   WBC 11.1*  --  9.6 7.0 6.5  LATICACIDVEN  --  1.19 1.1  --   --    Microbiology Recent Results (from the past 240 hour(s))  Urine culture     Status: None   Collection Time: 08/04/14 11:28 PM  Result Value Ref Range Status   Specimen Description URINE, CLEAN CATCH  Final   Special Requests NONE  Final   Culture   Final    >=100,000 COLONIES/mL ESCHERICHIA COLI Performed at [facility]     Report Status 08/09/2014 FINAL  Final   Organism ID, Bacteria ESCHERICHIA COLI  Final      Susceptibility   Escherichia coli - MIC*    AMPICILLIN <=2 SENSITIVE Sensitive     CEFAZOLIN <=4 SENSITIVE Sensitive     CEFTRIAXONE <=1 SENSITIVE Sensitive     CIPROFLOXACIN <=0.25 SENSITIVE Sensitive     GENTAMICIN <=1 SENSITIVE Sensitive     IMIPENEM <=0.25 SENSITIVE Sensitive     NITROFURANTOIN <=16 SENSITIVE Sensitive     TRIMETH/SULFA <=20 SENSITIVE Sensitive     AMPICILLIN/SULBACTAM <=2 SENSITIVE Sensitive     PIP/TAZO <=4 SENSITIVE Sensitive     * >=100,000 COLONIES/mL ESCHERICHIA COLI  Blood Culture (routine x 2)     Status: None (Preliminary result)   Collection Time: 08/05/14 12:33 AM  Result Value Ref Range Status   Specimen Description LEFT ANTECUBITAL  Final   Special Requests BOTTLES DRAWN AEROBIC AND ANAEROBIC  Final   Culture  Setup Time   Final    GRAM POSITIVE COCCI IN CLUSTERS IN BOTH AEROBIC AND ANAEROBIC BOTTLES CRITICAL RESULT CALLED TO, READ BACK BY AND VERIFIED WITH: M. REEVES RN [phone] GREEN R CONFIRMED BY R. HOLMES    Culture   Final    STAPHYLOCOCCUS SPECIES (COAGULASE NEGATIVE) Performed at [facility]    Report Status PENDING  Incomplete  Blood Culture (routine x 2)     Status: None (Preliminary result)   Collection Time: 08/05/14 12:33 AM  Result Value Ref Range Status   Specimen Description BLOOD RIGHT HAND  Final   Special Requests BOTTLES DRAWN AEROBIC AND ANAEROBIC  Final   Culture  Setup Time   Final    GRAM POSITIVE COCCI IN CLUSTERS IN BOTH AEROBIC AND ANAEROBIC BOTTLES GRAM STAIN REVIEWED-AGREE WITH RESULT MVESTAL  CRITICAL RESULT CALLED TO, READ BACK BY AND VERIFIED WITH: L FREI,RN AT 1006 08/06/14 BY KBARR    Culture   Final    STAPHYLOCOCCUS SPECIES (COAGULASE NEGATIVE) Performed at [facility]    Report Status PENDING  Incomplete  MRSA PCR Screening     Status: None   Collection Time: 08/05/14  4:18 AM  Result  Value Ref Range Status   MRSA by PCR  NEGATIVE NEGATIVE Final    Comment:        The GeneXpert MRSA Assay (FDA approved for NASAL specimens only), is one component of a comprehensive MRSA colonization surveillance program. It is not intended to diagnose MRSA infection nor to guide or monitor treatment for MRSA infections.   Urine culture     Status: None   Collection Time: 08/05/14 11:23 AM  Result Value Ref Range Status   Specimen Description URINE, RANDOM from kidney  Final   Special Requests NONE  Final   Culture   Final    NO GROWTH 1 DAY Performed at [facility]    Report Status 08/06/2014 FINAL  Final     Medications:   . antiseptic oral rinse  7 mL Mouth Rinse BID  . baclofen  10 mg Oral BID  . benztropine  1 mg Oral BID  .  ceFAZolin (ANCEF) IV  1 g Intravenous 3 times per day  . ferrous sulfate  325 mg Oral TID WC  . levothyroxine  50 mcg Oral QAC breakfast  . omega-3 acid ethyl esters  1 g Oral BID  . simvastatin  5 mg Oral QHS  . vancomycin  750 mg Intravenous Q12H   Continuous Infusions:   Time spent: 25 minutes.   LOS: 4 days   ,  [facility] Pager [phone]. If unable to reach me by pager, please call my cell phone at [phone].  *Please refer to [email or web address], password [email or web address] to get updated schedule on who will round on this patient, as hospitalists switch teams weekly. If 7PM-7AM, please contact night-coverage at [email or web address], password [email or web address] for any overnight needs.  08/09/2014, 4:26 PM

## 2014-08-09 NOTE — Progress Notes (Signed)
ANTIBIOTIC CONSULT NOTE  - Brief note for vancomycin trough  Pharmacy Consult for Vancomycin and Zosyn  Indication: CNS bacteremia  Refer to pharmacist's full note from earlier today.  Assessment: 81 YOF with MS, chronic quadriplegia in ED with AMS. She has solitary kidney.  Pharmacy asked to dose vancomycin and zosyn.  Blood cultures gram stains reveal GPC clusters, urine culture with GNR.    7/1 VT at 1700 = 21 mcg/ml on 750mg  q12 (prior to 5th dose), vanco to 1500mg  q24h  Goal of Therapy:  Vancomycin trough level 15-20 mcg/ml   08/05/2014 >> vanc >> 08/05/2014 >> zosyn >>  6/27 bloodx2: 2/2 GPC in clusters - CNS 6/26 urine: NG 6/27 urine: >100k GNR - reincubated 6/27 tissue cx from left perc nephrostomy: ?? Done  Plan:  Change vancomycin to 1500mg  IV q24h for est pk = 42, trough = 16   Labs:  Recent Labs  08/07/14 0023 08/09/14 0355  WBC  --  6.5  HGB  --  11.2*  PLT  --  254  CREATININE 1.41* 1.21*   Estimated Creatinine Clearance: 66.1 mL/min (by C-G formula based on Cr of 1.21).  Recent Labs  08/09/14 1710  VANCOTROUGH 21*    Juliette Alcide, PharmD, BCPS.   Pager: [phone] 08/09/2014 6:17 PM

## 2014-08-09 NOTE — Progress Notes (Signed)
Patient ID: Jacqueline Norman, female   DOB: 01-17-1971, 44 y.o.   MRN: [id number]    Referring Physician(s): Dahsltedt  Subjective:  Pt without new c/o  Allergies: Review of patient's allergies indicates no known allergies.  Medications: Prior to Admission medications   Medication Sig Start Date End Date Taking? Authorizing Provider  baclofen (LIORESAL) 10 MG tablet Take 10 mg by mouth 2 (two) times daily.   Yes Historical Provider, MD  benztropine (COGENTIN) 1 MG tablet Take 1 mg by mouth 2 (two) times daily.   Yes Historical Provider, MD  cholecalciferol (VITAMIN D) 1000 UNITS tablet Take 1,000 Units by mouth every morning.   Yes Historical Provider, MD  docusate sodium (COLACE) 100 MG capsule Take 100 mg by mouth daily as needed for mild constipation.   Yes Historical Provider, MD  ferrous sulfate 325 (65 FE) MG tablet Take 1 tablet (325 mg total) by mouth 3 (three) times daily with meals. 08/05/13  Yes Belkys A Regalado, MD  Interferon Beta-1b (BETASERON/EXTAVIA) 0.3 MG KIT injection Inject 0.3 mg into the skin every other day.   Yes Historical Provider, MD  levothyroxine (SYNTHROID, LEVOTHROID) 50 MCG tablet Take 50 mcg by mouth daily before breakfast.   Yes Historical Provider, MD  omega-3 acid ethyl esters (LOVAZA) 1 G capsule Take 1 g by mouth 2 (two) times daily.    Yes Historical Provider, MD  potassium chloride SA (K-DUR,KLOR-CON) 20 MEQ tablet Take 20 mEq by mouth every morning.   Yes Historical Provider, MD  simvastatin (ZOCOR) 5 MG tablet Take 5 mg by mouth at bedtime.    Yes Historical Provider, MD  ciprofloxacin (CIPRO) 500 MG tablet Take 1 tablet (500 mg total) by mouth 2 (two) times daily. X 7 days to prevent post-op infection 10/08/13   Alexis Frock, MD  oxyCODONE-acetaminophen (PERCOCET/ROXICET) 5-325 MG per tablet Take 1-2 tablets by mouth every 6 (six) hours as needed for moderate pain. Post-operatively Patient not taking: Reported on 08/05/2014 10/08/13   Alexis Frock, MD     Vital Signs: BP 136/94 mmHg  Pulse 102  Temp(Src) 98.1 F (36.7 C) (Oral)  Resp 18  Ht _0  (1.651 m)  Wt 196 lb 6.9 oz (89.1 kg)  BMI 32.69 kg/m2  SpO2 98%  LMP   Physical Exam left PCN intact, dressing dry, site NT, output 1925 cc's light yellow urine  Imaging: Dg Abd 1 View  08/06/2014   CLINICAL DATA:  Decreased output from the left-sided nephrostomy tube ; assess placement  EXAM: ABDOMEN - 1 VIEW  COMPARISON:  None  FINDINGS: A left nephrostomy tube is partially imaged. The tip of the tube overlies the left renal fossa. Its more lateral portion is not included in the field of view.  The bowel gas pattern is unremarkable. There are numerous phleboliths within the pelvis. There are degenerative changes of both hips.  IMPRESSION: The visualized portions of the nephrostomy tube project over the midpole region of the left kidney. Further evaluation with a nephrostogram, CT scanning, or ultrasound could be considered.   Electronically Signed   By: David  Martinique M.D.   On: 08/06/2014 08:53   Ir Nephrostomy Placement Left  08/06/2014   CLINICAL DATA:  Left hydronephrosis secondary to obstructing calculus. Previous right nephrectomy for XGPN. Fever.  EXAM: LEFT PERCUTANEOUS NEPHROSTOMY CATHETER PLACEMENT UNDER ULTRASOUND AND FLUOROSCOPIC GUIDANCE  FLUOROSCOPY TIME:  5 minutes, 66 mGy  TECHNIQUE: The procedure, risks (including but not limited to bleeding, infection, organ damage ),  benefits, and alternatives were explained to the patient. Questions regarding the procedure were encouraged and answered. The patient understands and consents to the procedure.  After laterality was marked, the leftFlank region prepped with Betadine, draped in usual sterile fashion, infiltrated locally with 1% lidocaine.Patient was receiving adequate prophylactic antibiotic coverage.  Intravenous Fentanyl and Versed were administered as conscious sedation during continuous cardiorespiratory monitoring by  the radiology RN, with a total moderate sedation time of 16 minutes.  Under real-time ultrasound guidance, a 21-gauge trocar needle was advanced into a posterior lower pole calyx. Ultrasound image documentation was saved. Urine spontaneously returned through the needle. Needle was exchanged over a guidewire for transitional dilator. Contrast injection confirmed appropriate positioning. Catheter was exchanged over a guidewire for a 10 French pigtail catheter, formed centrally within the left renal collecting system. Contrast injection confirms appropriate positioning and patency. Catheter secured externally with 0 Prolene suture and placed to external drain bag.  COMPLICATIONS: COMPLICATIONS none  IMPRESSION: 1. Technically successful left percutaneous nephrostomy catheter placement.   Electronically Signed   By: Lucrezia Europe M.D.   On: 08/06/2014 12:40    Labs:  CBC:  Recent Labs  08/04/14 2327 08/05/14 0500 08/06/14 0810 08/09/14 0355  WBC 11.1* 9.6 7.0 6.5  HGB 12.4 11.4* 10.4* 11.2*  HCT 37.7 35.9* 32.2* 33.3*  PLT 257 236 232 254    COAGS:  Recent Labs  08/05/14 0500  INR 1.19  APTT 31    BMP:  Recent Labs  08/05/14 0500 08/06/14 0810 08/07/14 0023 08/09/14 0355  NA 138 142 138 138  K 4.5 3.8 3.3* 2.9*  CL 109 113* 107 102  CO2 19* 20* 23 26  GLUCOSE 174* 161* 92 116*  BUN 27* _0 CALCIUM 8.5* 8.5* 8.8* 9.2  CREATININE 3.98* 2.14* 1.41* 1.21*  GFRNONAA 13* 27* 45* 54*  GFRAA 15* 31* 52* >60    LIVER FUNCTION TESTS:  Recent Labs  08/04/14 2327 08/05/14 0500 08/06/14 0810  BILITOT 0.9 1.2 1.1  AST _1 ALT _2 ALKPHOS 54 49 52  PROT 7.8 7.2 7.3  ALBUMIN 3.5 3.0* 2.8*    Assessment and Plan: S/p left PCN 6/28 secondary to obstructive hydronephrosis/stone/sepsis/ARF; afebrile; creat down to 1.21 (1.41), K 2.9, WBC nl; cont PCN as per urology recs; replace K; will see prn   Signed: D. Rowe Robert 08/09/2014, 9:36 AM   I spent a total  of 15 minutes in face to face in clinical consultation/evaluation, greater than 50% of which was counseling/coordinating care for left perc nephrostomy

## 2014-08-09 NOTE — Progress Notes (Signed)
ANTIBIOTIC CONSULT NOTE   Pharmacy Consult for Vancomycin and Zosyn  Indication: CNS bacteremia  No Known Allergies  Patient Measurements: Height:  (165.1 cm) Weight: 196 lb 6.9 oz (89.1 kg) IBW/kg (Calculated) : 57 Adjusted Body Weight:   Vital Signs: Temp: 98.1 F (36.7 C) (07/01 0410) Temp Source: Oral (07/01 0410) BP: 136/94 mmHg (07/01 0410) Pulse Rate: 102 (07/01 0410) Intake/Output from previous day: 06/30 0701 - 07/01 0700 In: 800 [IV Piggyback:200] Out: 1925 [Urine:1925] Intake/Output from this shift:    Labs:  Recent Labs  08/07/14 0023 08/09/14 0355  WBC  --  6.5  HGB  --  11.2*  PLT  --  254  CREATININE 1.41* 1.21*   Estimated Creatinine Clearance: 66.1 mL/min (by C-G formula based on Cr of 1.21). No results for input(s): VANCOTROUGH, VANCOPEAK, VANCORANDOM, GENTTROUGH, GENTPEAK, GENTRANDOM, TOBRATROUGH, TOBRAPEAK, TOBRARND, AMIKACINPEAK, AMIKACINTROU, AMIKACIN in the last 72 hours.   Microbiology: Recent Results (from the past 720 hour(s))  Urine culture     Status: None (Preliminary result)   Collection Time: 08/04/14 11:28 PM  Result Value Ref Range Status   Specimen Description URINE, CLEAN CATCH  Final   Special Requests NONE  Final   Culture   Final    >=100,000 COLONIES/mL GRAM NEGATIVE RODS Performed at [facility]    Report Status PENDING  Incomplete  Blood Culture (routine x 2)     Status: None (Preliminary result)   Collection Time: 08/05/14 12:33 AM  Result Value Ref Range Status   Specimen Description LEFT ANTECUBITAL  Final   Special Requests BOTTLES DRAWN AEROBIC AND ANAEROBIC  Final   Culture  Setup Time   Final    GRAM POSITIVE COCCI IN CLUSTERS IN BOTH AEROBIC AND ANAEROBIC BOTTLES CRITICAL RESULT CALLED TO, READ BACK BY AND VERIFIED WITH: M. REEVES RN [phone] GREEN R CONFIRMED BY R. HOLMES    Culture   Final    STAPHYLOCOCCUS SPECIES (COAGULASE NEGATIVE) Performed at [facility]    Report Status  PENDING  Incomplete  Blood Culture (routine x 2)     Status: None (Preliminary result)   Collection Time: 08/05/14 12:33 AM  Result Value Ref Range Status   Specimen Description BLOOD RIGHT HAND  Final   Special Requests BOTTLES DRAWN AEROBIC AND ANAEROBIC  Final   Culture  Setup Time   Final    GRAM POSITIVE COCCI IN CLUSTERS IN BOTH AEROBIC AND ANAEROBIC BOTTLES GRAM STAIN REVIEWED-AGREE WITH RESULT MVESTAL  CRITICAL RESULT CALLED TO, READ BACK BY AND VERIFIED WITH: L FREI,RN AT 1006 08/06/14 BY KBARR    Culture   Final    STAPHYLOCOCCUS SPECIES (COAGULASE NEGATIVE) Performed at [facility]    Report Status PENDING  Incomplete  MRSA PCR Screening     Status: None   Collection Time: 08/05/14  4:18 AM  Result Value Ref Range Status   MRSA by PCR NEGATIVE NEGATIVE Final    Comment:        The GeneXpert MRSA Assay (FDA approved for NASAL specimens only), is one component of a comprehensive MRSA colonization surveillance program. It is not intended to diagnose MRSA infection nor to guide or monitor treatment for MRSA infections.   Urine culture     Status: None   Collection Time: 08/05/14 11:23 AM  Result Value Ref Range Status   Specimen Description URINE, RANDOM from kidney  Final   Special Requests NONE  Final   Culture   Final  NO GROWTH 1 DAY Performed at [facility]    Report Status 08/06/2014 FINAL  Final    Medical History: Past Medical History  Diagnosis Date  . MS (multiple sclerosis)     BED BOUND -UP IN W/C USING HOYER LIFT- UNABLE TO MOVE ARMS AND LEGS - NOT ABLE TO FEED SELF- NO TROUBLE SWALLOWING - STATES SHE EATS WELL; INCONTINENT - WEARS DEPENDS  . Xanthogranulomatous pyelonephritis     RIGHT   . Perinephric abscess     HOSPITALIZED 07/29/13 TO 08/05/13.WITH SEPSIS  . Hypothyroidism   . Anemia     Medications:  Anti-infectives    Start     Dose/Rate Route Frequency Ordered Stop   08/07/14 1800  vancomycin (VANCOCIN) IVPB 750  mg/150 ml premix     750 mg 150 mL/hr over 60 Minutes Intravenous Every 12 hours 08/07/14 1048     08/06/14 0600  vancomycin (VANCOCIN) IVPB 1000 mg/200 mL premix  Status:  Discontinued     1,000 mg 200 mL/hr over 60 Minutes Intravenous Every 24 hours 08/05/14 0420 08/07/14 1048   08/05/14 0345  piperacillin-tazobactam (ZOSYN) IVPB 3.375 g     3.375 g 12.5 mL/hr over 240 Minutes Intravenous 3 times per day 08/05/14 0343     08/05/14 0345  vancomycin (VANCOCIN) IVPB 1000 mg/200 mL premix     1,000 mg 200 mL/hr over 60 Minutes Intravenous  Once 08/05/14 0343 08/05/14 0537   08/05/14 0130  cefTRIAXone (ROCEPHIN) 1 g in dextrose 5 % 50 mL IVPB     1 g 100 mL/hr over 30 Minutes Intravenous  Once 08/05/14 0116 08/05/14 0313     Assessment: 44 YOF with MS, chronic quadriplegia in ED with AMS. She has solitary kidney.  Pharmacy asked to dose vancomycin and zosyn.  Blood cultures gram stains reveal GPC clusters, urine culture with GNR.    08/05/2014 >> vanc >> 08/05/2014 >> zosyn >>  6/27 bloodx2: 2/2 GPC in clusters - CNS 6/26 urine: NG 6/27 urine: >100k GNR - reincubated 6/27 tissue cx from left perc nephrostomy: ?? Done  Renal: Scr has improved, resolved obstruction s/p R nephrostomy Afeb and WBC WNL  Goal of Therapy:  Vancomycin trough level 15-20 mcg/ml  Zosyn based on renal function   Plan:  Day #5 vanco/zosyn 1) Continue vanc 750mg  IV q12 2) Continue current Zosyn dosing 3) Will check vanc trough prior to PM dose tonight which will be the 6th total dose 4) Will await final sensitivities per ID consult with plan to continue IV abxs for 2 weeks and plan PO abx until stone removed  Hessie Knows, PharmD, BCPS Pager [phone] 08/09/2014 8:38 AM

## 2014-08-10 DIAGNOSIS — N200 Calculus of kidney: Secondary | ICD-10-CM

## 2014-08-10 DIAGNOSIS — B9620 Unspecified Escherichia coli [E. coli] as the cause of diseases classified elsewhere: Secondary | ICD-10-CM

## 2014-08-10 LAB — BASIC METABOLIC PANEL
ANION GAP: 10 (ref 5–15)
BUN: 7 mg/dL (ref 6–20)
CALCIUM: 9 mg/dL (ref 8.9–10.3)
CO2: 25 mmol/L (ref 22–32)
CREATININE: 0.97 mg/dL (ref 0.44–1.00)
Chloride: 103 mmol/L (ref 101–111)
GFR calc Af Amer: >60 mL/min (ref >60)
GFR calc non Af Amer: >60 mL/min (ref >60)
GLUCOSE: 101 mg/dL — AB (ref 65–99)
Potassium: 3.2 mmol/L — ABNORMAL LOW (ref 3.5–5.1)
SODIUM: 138 mmol/L (ref 135–145)

## 2014-08-10 LAB — CULTURE, BLOOD (ROUTINE X 2)

## 2014-08-10 LAB — GLUCOSE, CAPILLARY
GLUCOSE-CAPILLARY: 89 mg/dL (ref 65–99)
GLUCOSE-CAPILLARY: 94 mg/dL (ref 65–99)
Glucose-Capillary: 102 mg/dL — ABNORMAL HIGH (ref 65–99)
Glucose-Capillary: 128 mg/dL — ABNORMAL HIGH (ref 65–99)

## 2014-08-10 MED ORDER — POTASSIUM CHLORIDE 10 MEQ/100ML IV SOLN
10.0000 meq | INTRAVENOUS | Status: AC
  Administered 2014-08-10 (×4): 10 meq via INTRAVENOUS
  Filled 2014-08-10 (×6): qty 100

## 2014-08-10 NOTE — Progress Notes (Signed)
INFECTIOUS DISEASE PROGRESS NOTE  ID: Jacqueline Norman is a 44 y.o. female with  Principal Problem:   Sepsis secondary to coagulase-negative Staphylococcus bacteremia Active Problems:   Perinephric abscess   Multiple sclerosis   Hypokalemia   Renal stone   Acute encephalopathy   Acute renal failure   Hydronephrosis with renal and ureteral calculus obstruction   Nephrolithiasis  Subjective: Without complaints.  NL BM this am.   Abtx:  Anti-infectives    Start     Dose/Rate Route Frequency Ordered Stop   08/10/14 1000  vancomycin (VANCOCIN) 1,500 mg in sodium chloride 0.9 % 500 mL IVPB     1,500 mg 250 mL/hr over 120 Minutes Intravenous Every 24 hours 08/09/14 1841     08/09/14 1600  ceFAZolin (ANCEF) IVPB 1 g/50 mL premix     1 g 100 mL/hr over 30 Minutes Intravenous 3 times per day 08/09/14 1504     08/07/14 1800  vancomycin (VANCOCIN) IVPB 750 mg/150 ml premix  Status:  Discontinued     750 mg 150 mL/hr over 60 Minutes Intravenous Every 12 hours 08/07/14 1048 08/09/14 1841   08/06/14 0600  vancomycin (VANCOCIN) IVPB 1000 mg/200 mL premix  Status:  Discontinued     1,000 mg 200 mL/hr over 60 Minutes Intravenous Every 24 hours 08/05/14 0420 08/07/14 1048   08/05/14 0345  piperacillin-tazobactam (ZOSYN) IVPB 3.375 g  Status:  Discontinued     3.375 g 12.5 mL/hr over 240 Minutes Intravenous 3 times per day 08/05/14 0343 08/09/14 1504   08/05/14 0345  vancomycin (VANCOCIN) IVPB 1000 mg/200 mL premix     1,000 mg 200 mL/hr over 60 Minutes Intravenous  Once 08/05/14 0343 08/05/14 0537   08/05/14 0130  cefTRIAXone (ROCEPHIN) 1 g in dextrose 5 % 50 mL IVPB     1 g 100 mL/hr over 30 Minutes Intravenous  Once 08/05/14 0116 08/05/14 0313      Medications:  Scheduled: . antiseptic oral rinse  7 mL Mouth Rinse BID  . baclofen  10 mg Oral BID  . benztropine  1 mg Oral BID  .  ceFAZolin (ANCEF) IV  1 g Intravenous 3 times per day  . ferrous sulfate  325 mg Oral TID WC  .  Interferon Beta-1b  0.3 mg Subcutaneous QODAY  . levothyroxine  50 mcg Oral QAC breakfast  . omega-3 acid ethyl esters  1 g Oral BID  . potassium chloride SA  20 mEq Oral q morning - 10a  . simvastatin  5 mg Oral QHS  . vancomycin  1,500 mg Intravenous Q24H    Objective: Vital signs in last 24 hours: Temp:  [98.1 F (36.7 C)-98.7 F (37.1 C)] 98.6 F (37 C) (07/02 1355) Pulse Rate:  [86-108] 99 (07/02 1355) Resp:  [18-20] 20 (07/02 1355) BP: (117-124)/(73-89) 117/75 mmHg (07/02 1355) SpO2:  [98 %-100 %] 98 % (07/02 1355)   General appearance: alert, cooperative and no distress Resp: clear to auscultation bilaterally Cardio: regular rate and rhythm GI: normal findings: bowel sounds normal and soft, non-tender  Lab Results  Recent Labs  08/09/14 0355 08/10/14 0548  WBC 6.5  --   HGB 11.2*  --   HCT 33.3*  --   NA 138 138  K 2.9* 3.2*  CL 102 103  CO2 26 25  BUN 8 7  CREATININE 1.21* 0.97   Liver Panel No results for input(s): PROT, ALBUMIN, AST, ALT, ALKPHOS, BILITOT, BILIDIR, IBILI in the last 72 hours.  Sedimentation Rate No results for input(s): ESRSEDRATE in the last 72 hours. C-Reactive Protein No results for input(s): CRP in the last 72 hours.  Microbiology: Recent Results (from the past 240 hour(s))  Urine culture     Status: None   Collection Time: 08/04/14 11:28 PM  Result Value Ref Range Status   Specimen Description URINE, CLEAN CATCH  Final   Special Requests NONE  Final   Culture   Final    >=100,000 COLONIES/mL ESCHERICHIA COLI Performed at [facility]    Report Status 08/09/2014 FINAL  Final   Organism ID, Bacteria ESCHERICHIA COLI  Final      Susceptibility   Escherichia coli - MIC*    AMPICILLIN <=2 SENSITIVE Sensitive     CEFAZOLIN <=4 SENSITIVE Sensitive     CEFTRIAXONE <=1 SENSITIVE Sensitive     CIPROFLOXACIN <=0.25 SENSITIVE Sensitive     GENTAMICIN <=1 SENSITIVE Sensitive     IMIPENEM <=0.25 SENSITIVE Sensitive      NITROFURANTOIN <=16 SENSITIVE Sensitive     TRIMETH/SULFA <=20 SENSITIVE Sensitive     AMPICILLIN/SULBACTAM <=2 SENSITIVE Sensitive     PIP/TAZO <=4 SENSITIVE Sensitive     * >=100,000 COLONIES/mL ESCHERICHIA COLI  Blood Culture (routine x 2)     Status: None   Collection Time: 08/05/14 12:33 AM  Result Value Ref Range Status   Specimen Description LEFT ANTECUBITAL  Final   Special Requests BOTTLES DRAWN AEROBIC AND ANAEROBIC  Final   Culture  Setup Time   Final    GRAM POSITIVE COCCI IN CLUSTERS IN BOTH AEROBIC AND ANAEROBIC BOTTLES CRITICAL RESULT CALLED TO, READ BACK BY AND VERIFIED WITH: M. REEVES RN [phone] GREEN R CONFIRMED BY R. HOLMES    Culture   Final    STAPHYLOCOCCUS SPECIES (COAGULASE NEGATIVE) THE SIGNIFICANCE OF ISOLATING THIS ORGANISM FROM A SINGLE SET OF BLOOD CULTURES WHEN MULTIPLE SETS ARE DRAWN IS UNCERTAIN. PLEASE NOTIFY THE MICROBIOLOGY DEPARTMENT WITHIN ONE WEEK IF SPECIATION AND SENSITIVITIES ARE REQUIRED. Performed at [facility]    Report Status 08/10/2014 FINAL  Final  Blood Culture (routine x 2)     Status: None (Preliminary result)   Collection Time: 08/05/14 12:33 AM  Result Value Ref Range Status   Specimen Description BLOOD RIGHT HAND  Final   Special Requests BOTTLES DRAWN AEROBIC AND ANAEROBIC  Final   Culture  Setup Time   Final    GRAM POSITIVE COCCI IN CLUSTERS IN BOTH AEROBIC AND ANAEROBIC BOTTLES GRAM STAIN REVIEWED-AGREE WITH RESULT MVESTAL  CRITICAL RESULT CALLED TO, READ BACK BY AND VERIFIED WITH: L FREI,RN AT 1006 08/06/14 BY KBARR    Culture   Final    STAPHYLOCOCCUS SPECIES (COAGULASE NEGATIVE) Performed at [facility]    Report Status PENDING  Incomplete  MRSA PCR Screening     Status: None   Collection Time: 08/05/14  4:18 AM  Result Value Ref Range Status   MRSA by PCR NEGATIVE NEGATIVE Final    Comment:        The GeneXpert MRSA Assay (FDA approved for NASAL specimens only), is one component of  a comprehensive MRSA colonization surveillance program. It is not intended to diagnose MRSA infection nor to guide or monitor treatment for MRSA infections.   Urine culture     Status: None   Collection Time: 08/05/14 11:23 AM  Result Value Ref Range Status   Specimen Description URINE, RANDOM from kidney  Final   Special Requests NONE  Final  Culture   Final    NO GROWTH 1 DAY Performed at [facility]    Report Status 08/06/2014 FINAL  Final    Studies/Results: No results found.   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 I spoke with lab, the micro techs are not available at this point. I have asked them to notate the Cx for this to be tomorrow. I have ordered this as a miscellaneous test.  Possible she could be on 1 anbx if this is a sensitive organism.  Again, plan for her to be on po anbx til she has stone removed (amp, cipro).  Primary to address K+         Johny Sax Infectious Diseases (pager) [phone] [email or web address] 08/10/2014, 3:38 PM  LOS: 5 days

## 2014-08-10 NOTE — Progress Notes (Signed)
Progress Note   Jacqueline Norman IWL:[id number] DOB: 11/03/1970 DOA: 08/04/2014 PCP: Florentina Jenny, MD   Brief Narrative:   Jacqueline Norman is an 44 y.o. 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  Assessment/Plan:   Principal Problem:   Sepsis secondary to coagulase-negative Staphylococcus bacteremia and Escherichia coli UTI/ pyelonephritis/ perinephric abscess - CT scan done on admission showed perinephric stranding. Urine culture positive for Escherichia coli. - Blood cultures positive for coagulase-negative Staphylococcus, sensitivities pending. - Continue vancomycin and Ancef. - She will need to remain on oral antibiotics until her kidney stone has been removed. - She will need to be followed closely to ensure that the fluid collection seen on CT resolves without the need for drainage.  Active Problems:   Acute renal failure with obstruction of solitary left kidney secondary to a large left renal stone /left hydronephrosis - CT of the abdomen done on admission showed a 17 mm left UPJ stone as well as left lower pole calculus.  - Baseline creatinine less than 1, creatinine on admission 3.98. - Underwent placement of a nephrectomy tube 08/05/14, creatinine has steadily improved and is now back to usual baseline values.    Multiple sclerosis - Continue interferon beta and baclofen.    Hypothyroidism - Continue Synthroid.    Acute encephalopathy - EEG unremarkable. Likely secondary to sepsis syndrome. Resolved.    Hypokalemia - Improved, will give an additional 4 runs of potassium today.    DVT Prophylaxis - Continue SCDs.  Family Communication: Mother updated at the bedside. Disposition Plan: Home when culture and sensitivities finalized, likely in the next 24 hours. Code Status:     Code Status Orders         Start     Ordered   08/05/14 1905  Full code   Continuous     08/05/14 1904        IV Access:    Peripheral IV   Procedures and diagnostic studies:   Ct Abdomen Pelvis Wo Contrast  08/05/2014   CLINICAL DATA:  Sepsis.  Leukocytosis.  EXAM: CT ABDOMEN AND PELVIS WITHOUT CONTRAST  TECHNIQUE: Multidetector CT imaging of the abdomen and pelvis was performed following the standard protocol without IV contrast.  COMPARISON:  08/29/2013  FINDINGS: There is a 1.7 cm calculus in the left renal pelvis. There is another 1.7 cm calculus in the lower pole left collecting system. These are new from 08/29/2013. There is moderate left hydronephrosis. There is left perinephric stranding. The left ureter is normal in caliber, with no ureteral calculi.  In the right nephrectomy bed, there is a 2.4 x 4.3 by 3.7 cm complex -appearing collection which could represent residual abscess or residual inflammation. The nephrectomy bed is otherwise unremarkable.  There is cholelithiasis.  There are unremarkable unenhanced appearances of the liver, spleen, pancreas and adrenals.  Bowel is unremarkable.  Uterus and ovaries appear unremarkable.  Abdominal aorta is normal in caliber with mild atherosclerotic calcification.  There is mild right lung base opacity which could be atelectatic or infectious. There are no effusions.  No significant musculoskeletal lesion is evident. Moderately severe degenerative disc and facet changes are present in the lower lumbar spine. There is a bilateral L5 spondylolysis with minimal spondylolisthesis.  IMPRESSION: 1. New left nephrolithiasis and hydronephrosis. No ureteral calculi are evident. There is a 1.7 cm left renal  pelvic calculus and another 1.7 cm left lower pole collecting system calculus. 2. 2.4 x 3.7 x 4.3 cm right nephrectomy bed collection which could represent residual abscess or inflammation. 3. Cholelithiasis without evidence of an acute process in the biliary system.  4. Mild right lung base opacity due to atelectasis or infection. 5. Incidentally noted spondylolysis and minimal spondylolisthesis at L5. Lower lumbar degenerative disc and facet changes.   Electronically Signed   By: Ellery Plunk M.D.   On: 08/05/2014 03:12   Dg Abd 1 View  08/06/2014   CLINICAL DATA:  Decreased output from the left-sided nephrostomy tube ; assess placement  EXAM: ABDOMEN - 1 VIEW  COMPARISON:  None  FINDINGS: A left nephrostomy tube is partially imaged. The tip of the tube overlies the left renal fossa. Its more lateral portion is not included in the field of view.  The bowel gas pattern is unremarkable. There are numerous phleboliths within the pelvis. There are degenerative changes of both hips.  IMPRESSION: The visualized portions of the nephrostomy tube project over the midpole region of the left kidney. Further evaluation with a nephrostogram, CT scanning, or ultrasound could be considered.   Electronically Signed   By: David  [place] M.D.   On: 08/06/2014 08:53   Ct Head Wo Contrast  08/05/2014   CLINICAL DATA:  Altered mental status. History of multiple sclerosis. Mental status changes began at 19:00.  EXAM: CT HEAD WITHOUT CONTRAST  TECHNIQUE: Contiguous axial images were obtained from the base of the skull through the vertex without intravenous contrast.  COMPARISON:  None.  FINDINGS: There is no intracranial hemorrhage, mass or evidence of acute infarction. There is no extra-axial fluid collection. There is moderate generalized atrophy, greatest in the frontal lobes. There is extensive cerebral white matter hypodensity, greatest in the periventricular regions, nonspecific but consistent with the described history of long-standing demyelinating disease. No acute intracranial findings are evident. No bony abnormality is evident.  IMPRESSION: Severe white matter hypodensities in both cerebral hemispheres, consistent with the described history of longstanding demyelinating disease.  Moderate generalized atrophy, greatest in the frontal lobes. No acute findings are evident.   Electronically Signed   By: Ellery Plunk M.D.   On: 08/05/2014 00:41   US Renal  08/05/2014   CLINICAL DATA:  Renal insufficiency.  Right nephrectomy.  EXAM: RENAL / URINARY TRACT ULTRASOUND COMPLETE  COMPARISON:  CT 08/29/2013  FINDINGS: Right Kidney:  Absent.  Prior nephrectomy.  Left Kidney:  Length: 14.6 cm. There is a lower pole left renal calculus with branching morphology suggesting early staghorn formation. No hydronephrosis.  Bladder:  Grossly normal with limited visibility due to low volume.  IMPRESSION: Lower pole left nephrolithiasis with branching morphology suggesting early staghorn. No hydronephrosis.   Electronically Signed   By: Ellery Plunk M.D.   On: 08/05/2014 02:52   Dg Chest Port 1 View  08/05/2014   CLINICAL DATA:  Sepsis.  EXAM: PORTABLE CHEST - 1 VIEW  COMPARISON:  02/03/2004  FINDINGS: There is elevation of right hemidiaphragm with adjacent linear opacity, likely atelectasis. Lung volumes are low. Cardiomediastinal contours are normal for technique. No large pleural effusion or pneumothorax. No pulmonary edema.  IMPRESSION: Hypoventilatory chest with elevation of right hemidiaphragm and adjacent linear opacities, likely atelectasis.   Electronically Signed   By: Rubye Oaks M.D.   On: 08/05/2014 02:45   Ir Nephrostomy Placement Left  08/06/2014   CLINICAL DATA:  Left hydronephrosis secondary to obstructing calculus. Previous right nephrectomy  for XGPN. Fever.  EXAM: LEFT PERCUTANEOUS NEPHROSTOMY CATHETER PLACEMENT UNDER ULTRASOUND AND FLUOROSCOPIC GUIDANCE  FLUOROSCOPY TIME:  5 minutes, 66 mGy  TECHNIQUE: The procedure, risks (including but not limited to bleeding, infection, organ damage ), benefits, and alternatives were explained to the patient. Questions regarding the procedure were encouraged and answered. The patient understands and consents to the procedure.  After  laterality was marked, the leftFlank region prepped with Betadine, draped in usual sterile fashion, infiltrated locally with 1% lidocaine.Patient was receiving adequate prophylactic antibiotic coverage.  Intravenous Fentanyl and Versed were administered as conscious sedation during continuous cardiorespiratory monitoring by the radiology RN, with a total moderate sedation time of 16 minutes.  Under real-time ultrasound guidance, a 21-gauge trocar needle was advanced into a posterior lower pole calyx. Ultrasound image documentation was saved. Urine spontaneously returned through the needle. Needle was exchanged over a guidewire for transitional dilator. Contrast injection confirmed appropriate positioning. Catheter was exchanged over a guidewire for a 10 French pigtail catheter, formed centrally within the left renal collecting system. Contrast injection confirms appropriate positioning and patency. Catheter secured externally with 0 Prolene suture and placed to external drain bag.  COMPLICATIONS: COMPLICATIONS none  IMPRESSION: 1. Technically successful left percutaneous nephrostomy catheter placement.   Electronically Signed   By: Corlis Leak M.D.   On: 08/06/2014 12:40     Medical Consultants:    Infectious Disease  Urology  Anti-Infectives:    Vancomycin 08/05/14--->  Zosyn 08/05/14--->08/09/14  Ancef 08/09/14--->  Subjective:   Kathlee Nations Fiorito had some hand pain, thinks it was from the potassium infusion.  No nausea or vomiting.  Appetite at baseline.  Last BM was earlier today.  Objective:    Filed Vitals:   08/09/14 0410 08/09/14 1448 08/09/14 2012 08/10/14 0422  BP: 136/94 121/81 124/89 123/73  Pulse: 102 83 86 108  Temp: 98.1 F (36.7 C) 97.6 F (36.4 C) 98.1 F (36.7 C) 98.7 F (37.1 C)  TempSrc: Oral Oral Oral Oral  Resp: 18 20 18 18   Height:      Weight:      SpO2: 98% 100% 100% 99%    Intake/Output Summary (Last 24 hours) at 08/10/14 1311 Last data filed at 08/10/14  0900  Gross per 24 hour  Intake    100 ml  Output   1750 ml  Net  -1650 ml    Exam: Gen:  NAD, alert Cardiovascular:  RRR, No M/R/G Respiratory:  Lungs CTAB Gastrointestinal:  Abdomen soft, NT/ND, + BS Extremities:  No C/E/C Neuro: Generalized weakness   Data Reviewed:    Labs: Basic Metabolic Panel:  Recent Labs Lab 08/05/14 0500 08/06/14 0810 08/07/14 0023 08/09/14 0335 08/09/14 0355 08/10/14 0548  NA 138 142 138  --  138 138  K 4.5 3.8 3.3*  --  2.9* 3.2*  CL 109 113* 107  --  102 103  CO2 19* 20* 23  --  26 25  GLUCOSE 174* 161* 92  --  116* 101*  BUN 27* 19 12  --  8 7  CREATININE 3.98* 2.14* 1.41*  --  1.21* 0.97  CALCIUM 8.5* 8.5* 8.8*  --  9.2 9.0  MG  --   --   --  2.1  --   --    GFR Estimated Creatinine Clearance: 82.4 mL/min (by C-G formula based on Cr of 0.97). Liver Function Tests:  Recent Labs Lab 08/04/14 2327 08/05/14 0500 08/06/14 0810  AST 21 18 17   ALT 18 15  16  ALKPHOS 54 49 52  BILITOT 0.9 1.2 1.1  PROT 7.8 7.2 7.3  ALBUMIN 3.5 3.0* 2.8*   Coagulation profile  Recent Labs Lab 08/05/14 0500  INR 1.19    CBC:  Recent Labs Lab 08/04/14 2327 08/05/14 0500 08/06/14 0810 08/09/14 0355  WBC 11.1* 9.6 7.0 6.5  NEUTROABS  --  7.4  --   --   HGB 12.4 11.4* 10.4* 11.2*  HCT 37.7 35.9* 32.2* 33.3*  MCV 86.5 88.0 88.0 86.0  PLT 257 236 232 254   CBG:  Recent Labs Lab 08/09/14 2017 08/09/14 2344 08/10/14 0420 08/10/14 0806 08/10/14 1201  GLUCAP 98 96 89 102* 94   Sepsis Labs:  Recent Labs Lab 08/04/14 2327 08/04/14 2341 08/05/14 0500 08/06/14 0810 08/09/14 0355  PROCALCITON  --   --  0.67  --   --   WBC 11.1*  --  9.6 7.0 6.5  LATICACIDVEN  --  1.19 1.1  --   --    Microbiology Recent Results (from the past 240 hour(s))  Urine culture     Status: None   Collection Time: 08/04/14 11:28 PM  Result Value Ref Range Status   Specimen Description URINE, CLEAN CATCH  Final   Special Requests NONE  Final    Culture   Final    >=100,000 COLONIES/mL ESCHERICHIA COLI Performed at [facility]    Report Status 08/09/2014 FINAL  Final   Organism ID, Bacteria ESCHERICHIA COLI  Final      Susceptibility   Escherichia coli - MIC*    AMPICILLIN <=2 SENSITIVE Sensitive     CEFAZOLIN <=4 SENSITIVE Sensitive     CEFTRIAXONE <=1 SENSITIVE Sensitive     CIPROFLOXACIN <=0.25 SENSITIVE Sensitive     GENTAMICIN <=1 SENSITIVE Sensitive     IMIPENEM <=0.25 SENSITIVE Sensitive     NITROFURANTOIN <=16 SENSITIVE Sensitive     TRIMETH/SULFA <=20 SENSITIVE Sensitive     AMPICILLIN/SULBACTAM <=2 SENSITIVE Sensitive     PIP/TAZO <=4 SENSITIVE Sensitive     * >=100,000 COLONIES/mL ESCHERICHIA COLI  Blood Culture (routine x 2)     Status: None   Collection Time: 08/05/14 12:33 AM  Result Value Ref Range Status   Specimen Description LEFT ANTECUBITAL  Final   Special Requests BOTTLES DRAWN AEROBIC AND ANAEROBIC  Final   Culture  Setup Time   Final    GRAM POSITIVE COCCI IN CLUSTERS IN BOTH AEROBIC AND ANAEROBIC BOTTLES CRITICAL RESULT CALLED TO, READ BACK BY AND VERIFIED WITH: M. REEVES RN [phone] GREEN R CONFIRMED BY R. HOLMES    Culture   Final    STAPHYLOCOCCUS SPECIES (COAGULASE NEGATIVE) THE SIGNIFICANCE OF ISOLATING THIS ORGANISM FROM A SINGLE SET OF BLOOD CULTURES WHEN MULTIPLE SETS ARE DRAWN IS UNCERTAIN. PLEASE NOTIFY THE MICROBIOLOGY DEPARTMENT WITHIN ONE WEEK IF SPECIATION AND SENSITIVITIES ARE REQUIRED. Performed at [facility]    Report Status 08/10/2014 FINAL  Final  Blood Culture (routine x 2)     Status: None (Preliminary result)   Collection Time: 08/05/14 12:33 AM  Result Value Ref Range Status   Specimen Description BLOOD RIGHT HAND  Final   Special Requests BOTTLES DRAWN AEROBIC AND ANAEROBIC  Final   Culture  Setup Time   Final    GRAM POSITIVE COCCI IN CLUSTERS IN BOTH AEROBIC AND ANAEROBIC BOTTLES GRAM STAIN REVIEWED-AGREE WITH RESULT MVESTAL  CRITICAL RESULT  CALLED TO, READ BACK BY AND VERIFIED WITH: L FREI,RN AT 1006 08/06/14 BY  KBARR    Culture   Final    STAPHYLOCOCCUS SPECIES (COAGULASE NEGATIVE) Performed at [facility]    Report Status PENDING  Incomplete  MRSA PCR Screening     Status: None   Collection Time: 08/05/14  4:18 AM  Result Value Ref Range Status   MRSA by PCR NEGATIVE NEGATIVE Final    Comment:        The GeneXpert MRSA Assay (FDA approved for NASAL specimens only), is one component of a comprehensive MRSA colonization surveillance program. It is not intended to diagnose MRSA infection nor to guide or monitor treatment for MRSA infections.   Urine culture     Status: None   Collection Time: 08/05/14 11:23 AM  Result Value Ref Range Status   Specimen Description URINE, RANDOM from kidney  Final   Special Requests NONE  Final   Culture   Final    NO GROWTH 1 DAY Performed at [facility]    Report Status 08/06/2014 FINAL  Final     Medications:   . antiseptic oral rinse  7 mL Mouth Rinse BID  . baclofen  10 mg Oral BID  . benztropine  1 mg Oral BID  .  ceFAZolin (ANCEF) IV  1 g Intravenous 3 times per day  . ferrous sulfate  325 mg Oral TID WC  . Interferon Beta-1b  0.3 mg Subcutaneous QODAY  . levothyroxine  50 mcg Oral QAC breakfast  . omega-3 acid ethyl esters  1 g Oral BID  . potassium chloride SA  20 mEq Oral q morning - 10a  . simvastatin  5 mg Oral QHS  . vancomycin  1,500 mg Intravenous Q24H   Continuous Infusions:   Time spent: 25 minutes.   LOS: 5 days   RAMA,CHRISTINA  [facility] Pager [phone]. If unable to reach me by pager, please call my cell phone at [phone].  *Please refer to [email or web address], password [email or web address] to get updated schedule on who will round on this patient, as hospitalists switch teams weekly. If 7PM-7AM, please contact night-coverage at [email or web address], password [email or web address] for any overnight needs.  08/10/2014, 1:11 PM

## 2014-08-11 DIAGNOSIS — A4151 Sepsis due to Escherichia coli [E. coli]: Secondary | ICD-10-CM

## 2014-08-11 DIAGNOSIS — N179 Acute kidney failure, unspecified: Secondary | ICD-10-CM | POA: Insufficient documentation

## 2014-08-11 LAB — BASIC METABOLIC PANEL
Anion gap: 9 (ref 5–15)
BUN: 6 mg/dL (ref 6–20)
CALCIUM: 8.8 mg/dL — AB (ref 8.9–10.3)
CO2: 24 mmol/L (ref 22–32)
Chloride: 103 mmol/L (ref 101–111)
Creatinine, Ser: 1.05 mg/dL — ABNORMAL HIGH (ref 0.44–1.00)
GFR calc Af Amer: >60 mL/min (ref >60)
GFR calc non Af Amer: >60 mL/min (ref >60)
GLUCOSE: 126 mg/dL — AB (ref 65–99)
Potassium: 3.2 mmol/L — ABNORMAL LOW (ref 3.5–5.1)
Sodium: 136 mmol/L (ref 135–145)

## 2014-08-11 MED ORDER — POTASSIUM CHLORIDE CRYS ER 20 MEQ PO TBCR
20.0000 meq | EXTENDED_RELEASE_TABLET | Freq: Three times a day (TID) | ORAL | Status: DC
  Administered 2014-08-11: 20 meq via ORAL
  Filled 2014-08-11: qty 1

## 2014-08-11 MED ORDER — CEPHALEXIN 500 MG PO CAPS
500.0000 mg | ORAL_CAPSULE | Freq: Two times a day (BID) | ORAL | Status: DC
Start: 2014-08-11 — End: 2014-09-14

## 2014-08-11 NOTE — Discharge Instructions (Signed)
Antibiotic Medication Antibiotics are among the most frequently prescribed medicines. Antibiotics cure illness by assisting our body to injure or kill the bacteria that cause infection. While antibiotics are useful to treat a wide variety of infections they are useless against viruses. Antibiotics cannot cure colds, flu, or other viral infections.  There are many types of antibiotics available. Your caregiver will decide which antibiotic will be useful for an illness. Never take or give someone else's antibiotics or left over medicine. Your caregiver may also take into account:  Allergies.  The cost of the medicine.  Dosing schedules.  Taste.  Common side effects when choosing an antibiotic for an infection. Ask your caregiver if you have questions about why a certain medicine was chosen. HOME CARE INSTRUCTIONS Read all instructions and labels on medicine bottles carefully. Some antibiotics should be taken on an empty stomach while others should be taken with food. Taking antibiotics incorrectly may reduce how well they work. Some antibiotics need to be kept in the refrigerator. Others should be kept at room temperature. Ask your caregiver or pharmacist if you do not understand how to give the medicine. Be sure to give the amount of medicine your caregiver has prescribed. Even if you feel better and your symptoms improve, bacteria may still remain alive in the body. Taking all of the medicine will prevent:  The infection from returning and becoming harder to treat.  Complications from partially treated infections. If there is any medicine left over after you have taken the medicine as your caregiver has instructed, throw the medicine away. Be sure to tell your caregiver if you:  Are allergic to any medicines.  Are pregnant or intend to become pregnant while using this medicine.  Are breastfeeding.  Are taking any other prescription, non-prescription medicine, or herbal  remedies.  Have any other medical conditions or problems you have not already discussed. If you are taking birth control pills, they may not work while you are on antibiotics. To avoid unwanted pregnancy:  Continue taking your birth control pills as usual.  Use a second form of birth control (such as condoms) while you are taking antibiotic medicine.  When you finish taking the antibiotic medicine, continue using the second form of birth control until you are finished with your current 1 month cycle of birth control pills. Try not to miss any doses of medicine. If you miss a dose, take it as soon as possible. However, if it is almost time for the next dose and the dosing schedule is:  2 doses a day, take the missed dose and the next dose 5 to 6 hours apart.  3 or more doses a day, take the missed dose and the next dose 2 to 4 hours apart, then go back to the normal schedule.  If you are unable to make up a missed dose, take the next scheduled dose on time and complete the missed dose at the end of the prescribed time for your medicine. SIDE EFFECTS TO TAKING ANTIBIOTICS Common side effects to antibiotic use include:  Soft stools or diarrhea.  Mild stomach upset.  Sun sensitivity. SEEK MEDICAL CARE IF:   If you get worse or do not improve within a few days of starting the medicine.  Vomiting develops.  Diaper rash or rash on the genitals appears.  Vaginal itching occurs.  White patches appear on the tongue or in the mouth.  Severe watery diarrhea and abdominal cramps occur.  Signs of an allergy develop (hives, unknown  itchy rash appears). STOP TAKING THE ANTIBIOTIC. SEEK IMMEDIATE MEDICAL CARE IF:   Urine turns dark or blood colored.  Skin turns yellow.  Easy bruising or bleeding occurs.  Joint pain or muscle aches occur.  Fever returns.  Severe headache occurs.  Signs of an allergy develop (trouble breathing, wheezing, swelling of the lips, face or tongue,  fainting, or blisters on the skin or in the mouth). STOP TAKING THE ANTIBIOTIC. Document Released: 10/08/2003 Document Revised: 04/19/2011 Document Reviewed: 10/17/2008 [facility] Patient Information 2015 [place], [place]. This information is not intended to replace advice given to you by your health care provider. Make sure you discuss any questions you have with your health care provider.  Kidney Stones Kidney stones (urolithiasis) are deposits that form inside your kidneys. The intense pain is caused by the stone moving through the urinary tract. When the stone moves, the ureter goes into spasm around the stone. The stone is usually passed in the urine.  CAUSES   A disorder that makes certain neck glands produce too much parathyroid hormone (primary hyperparathyroidism).  A buildup of uric acid crystals, similar to gout in your joints.  Narrowing (stricture) of the ureter.  A kidney obstruction present at birth (congenital obstruction).  Previous surgery on the kidney or ureters.  Numerous kidney infections. SYMPTOMS   Feeling sick to your stomach (nauseous).  Throwing up (vomiting).  Blood in the urine (hematuria).  Pain that usually spreads (radiates) to the groin.  Frequency or urgency of urination. DIAGNOSIS   Taking a history and physical exam.  Blood or urine tests.  CT scan.  Occasionally, an examination of the inside of the urinary bladder (cystoscopy) is performed. TREATMENT   Observation.  Increasing your fluid intake.  Extracorporeal shock wave lithotripsy--This is a noninvasive procedure that uses shock waves to break up kidney stones.  Surgery may be needed if you have severe pain or persistent obstruction. There are various surgical procedures. Most of the procedures are performed with the use of small instruments. Only small incisions are needed to accommodate these instruments, so recovery time is minimized. The size, location, and chemical composition  are all important variables that will determine the proper choice of action for you. Talk to your health care provider to better understand your situation so that you will minimize the risk of injury to yourself and your kidney.  HOME CARE INSTRUCTIONS   Drink enough water and fluids to keep your urine clear or pale yellow. This will help you to pass the stone or stone fragments.  Strain all urine through the provided strainer. Keep all particulate matter and stones for your health care provider to see. The stone causing the pain may be as small as a grain of salt. It is very important to use the strainer each and every time you pass your urine. The collection of your stone will allow your health care provider to analyze it and verify that a stone has actually passed. The stone analysis will often identify what you can do to reduce the incidence of recurrences.  Only take over-the-counter or prescription medicines for pain, discomfort, or fever as directed by your health care provider.  Make a follow-up appointment with your health care provider as directed.  Get follow-up X-rays if required. The absence of pain does not always mean that the stone has passed. It may have only stopped moving. If the urine remains completely obstructed, it can cause loss of kidney function or even complete destruction of the kidney.  It is your responsibility to make sure X-rays and follow-ups are completed. Ultrasounds of the kidney can show blockages and the status of the kidney. Ultrasounds are not associated with any radiation and can be performed easily in a matter of minutes. SEEK MEDICAL CARE IF:  You experience pain that is progressive and unresponsive to any pain medicine you have been prescribed. SEEK IMMEDIATE MEDICAL CARE IF:   Pain cannot be controlled with the prescribed medicine.  You have a fever or shaking chills.  The severity or intensity of pain increases over 18 hours and is not relieved by  pain medicine.  You develop a new onset of abdominal pain.  You feel faint or pass out.  You are unable to urinate. MAKE SURE YOU:   Understand these instructions.  Will watch your condition.  Will get help right away if you are not doing well or get worse. Document Released: 01/25/2005 Document Revised: 09/27/2012 Document Reviewed: 06/28/2012 [facility] Patient Information 2015 [place], [place]. This information is not intended to replace advice given to you by your health care provider. Make sure you discuss any questions you have with your health care provider.

## 2014-08-11 NOTE — Discharge Summary (Addendum)
Physician Discharge Summary  Jacqueline Norman KCM:[id number] DOB: [birth year] DOA: 08/04/2014  PCP: Jacqueline Poll, MD  Admit date: 08/04/2014 Discharge date: 08/11/2014   Recommendations for Outpatient Follow-Up:   1. Will F/U with urologist for definitive treatment of kidney stone post discharge.   Discharge Diagnosis:   Principal Problem:    Sepsis secondary to coagulase-negative Staphylococcus bacteremia and Escherichia coli UTI Active Problems:    Perinephric abscess    Multiple sclerosis    Hypokalemia    Renal stone    Acute encephalopathy    Acute renal failure    Hydronephrosis with renal and ureteral calculus obstruction    Nephrolithiasis    AKI (acute kidney injury)   Discharge disposition:  Home.    Discharge Condition: Improved.  Diet recommendation:  Regular.   History of Present Illness:   Jacqueline Norman is an 44 y.o. 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  Hospital Course by Problem:   Principal Problem:  Sepsis secondary to coagulase-negative Staphylococcus bacteremia and Escherichia coli UTI/ pyelonephritis/ perinephric abscess - CT scan done on admission showed perinephric stranding. Urine culture positive for Escherichia coli. - Blood cultures positive for coagulase-negative Staphylococcus, sensitivities showing different isolates, suspicious for contamination - Treated with empiric vancomycin and Ancef, will discharge home on Keflex. - She will need to remain on oral antibiotics until her kidney stone has been removed. - She will need to be followed closely to ensure that the fluid collection seen on CT resolves without the need for drainage.  Active Problems:  Acute renal failure with obstruction of solitary left kidney secondary to a large left renal stone /left  hydronephrosis - CT of the abdomen done on admission showed a 17 mm left UPJ stone as well as left lower pole calculus.  - Baseline creatinine less than 1, creatinine on admission 3.98. - Underwent placement of a nephrectomy tube 08/05/14, creatinine has steadily improved and is now back to usual baseline values.   Multiple sclerosis - Continue interferon beta and baclofen.   Hypothyroidism - Continue Synthroid.   Acute encephalopathy - EEG unremarkable. Likely secondary to sepsis syndrome. Resolved.   Hypokalemia - Repleted 08/10/14. Takes oral supplementation at home.  Medical Consultants:    Urology  Infectious Disease   Discharge Exam:   Filed Vitals:   08/11/14 1415  BP: 115/68  Pulse: 82  Temp: 98 F (36.7 C)  Resp: 18   Filed Vitals:   08/10/14 1355 08/10/14 2024 08/11/14 0610 08/11/14 1415  BP: 117/75 121/76 114/76 115/68  Pulse: 99 87 85 82  Temp: 98.6 F (37 C) 98.3 F (36.8 C) 98.2 F (36.8 C) 98 F (36.7 C)  TempSrc: Axillary Oral Oral Oral  Resp: _0 Height:      Weight:      SpO2: 98% 99% 100% 100%    Gen:  NAD Cardiovascular:  RRR, No M/R/G Respiratory: Lungs CTAB Gastrointestinal: Abdomen soft, NT/ND with normal active bowel sounds. Extremities: No C/E/C   The results of significant diagnostics from this hospitalization (including imaging, microbiology, ancillary and laboratory) are listed below for reference.     Procedures and Diagnostic Studies:   Ct Abdomen Pelvis Wo Contrast  08/05/2014   CLINICAL DATA:  Sepsis.  Leukocytosis.  EXAM: CT ABDOMEN AND PELVIS WITHOUT CONTRAST  TECHNIQUE: Multidetector CT imaging of the abdomen and  pelvis was performed following the standard protocol without IV contrast.  COMPARISON:  08/29/2013  FINDINGS: There is a 1.7 cm calculus in the left renal pelvis. There is another 1.7 cm calculus in the lower pole left collecting system. These are new from 08/29/2013. There is moderate left  hydronephrosis. There is left perinephric stranding. The left ureter is normal in caliber, with no ureteral calculi.  In the right nephrectomy bed, there is a 2.4 x 4.3 by 3.7 cm complex -appearing collection which could represent residual abscess or residual inflammation. The nephrectomy bed is otherwise unremarkable.  There is cholelithiasis.  There are unremarkable unenhanced appearances of the liver, spleen, pancreas and adrenals.  Bowel is unremarkable.  Uterus and ovaries appear unremarkable.  Abdominal aorta is normal in caliber with mild atherosclerotic calcification.  There is mild right lung base opacity which could be atelectatic or infectious. There are no effusions.  No significant musculoskeletal lesion is evident. Moderately severe degenerative disc and facet changes are present in the lower lumbar spine. There is a bilateral L5 spondylolysis with minimal spondylolisthesis.  IMPRESSION: 1. New left nephrolithiasis and hydronephrosis. No ureteral calculi are evident. There is a 1.7 cm left renal pelvic calculus and another 1.7 cm left lower pole collecting system calculus. 2. 2.4 x 3.7 x 4.3 cm right nephrectomy bed collection which could represent residual abscess or inflammation. 3. Cholelithiasis without evidence of an acute process in the biliary system. 4. Mild right lung base opacity due to atelectasis or infection. 5. Incidentally noted spondylolysis and minimal spondylolisthesis at L5. Lower lumbar degenerative disc and facet changes.   Electronically Signed   By: Andreas Newport M.D.   On: 08/05/2014 03:12   Dg Abd 1 View  08/06/2014   CLINICAL DATA:  Decreased output from the left-sided nephrostomy tube ; assess placement  EXAM: ABDOMEN - 1 VIEW  COMPARISON:  None  FINDINGS: A left nephrostomy tube is partially imaged. The tip of the tube overlies the left renal fossa. Its more lateral portion is not included in the field of view.  The bowel gas pattern is unremarkable. There are  numerous phleboliths within the pelvis. There are degenerative changes of both hips.  IMPRESSION: The visualized portions of the nephrostomy tube project over the midpole region of the left kidney. Further evaluation with a nephrostogram, CT scanning, or ultrasound could be considered.   Electronically Signed   By: David  Martinique M.D.   On: 08/06/2014 08:53   Ct Head Wo Contrast  08/05/2014   CLINICAL DATA:  Altered mental status. History of multiple sclerosis. Mental status changes began at 19:00.  EXAM: CT HEAD WITHOUT CONTRAST  TECHNIQUE: Contiguous axial images were obtained from the base of the skull through the vertex without intravenous contrast.  COMPARISON:  None.  FINDINGS: There is no intracranial hemorrhage, mass or evidence of acute infarction. There is no extra-axial fluid collection. There is moderate generalized atrophy, greatest in the frontal lobes. There is extensive cerebral white matter hypodensity, greatest in the periventricular regions, nonspecific but consistent with the described history of long-standing demyelinating disease. No acute intracranial findings are evident. No bony abnormality is evident.  IMPRESSION: Severe white matter hypodensities in both cerebral hemispheres, consistent with the described history of longstanding demyelinating disease. Moderate generalized atrophy, greatest in the frontal lobes. No acute findings are evident.   Electronically Signed   By: Andreas Newport M.D.   On: 08/05/2014 00:41   US Renal  08/05/2014   CLINICAL DATA:  Renal  insufficiency.  Right nephrectomy.  EXAM: RENAL / URINARY TRACT ULTRASOUND COMPLETE  COMPARISON:  CT 08/29/2013  FINDINGS: Right Kidney:  Absent.  Prior nephrectomy.  Left Kidney:  Length: 14.6 cm. There is a lower pole left renal calculus with branching morphology suggesting early staghorn formation. No hydronephrosis.  Bladder:  Grossly normal with limited visibility due to low volume.  IMPRESSION: Lower pole left  nephrolithiasis with branching morphology suggesting early staghorn. No hydronephrosis.   Electronically Signed   By: Andreas Newport M.D.   On: 08/05/2014 02:52   Dg Chest Port 1 View  08/05/2014   CLINICAL DATA:  Sepsis.  EXAM: PORTABLE CHEST - 1 VIEW  COMPARISON:  02/03/2004  FINDINGS: There is elevation of right hemidiaphragm with adjacent linear opacity, likely atelectasis. Lung volumes are low. Cardiomediastinal contours are normal for technique. No large pleural effusion or pneumothorax. No pulmonary edema.  IMPRESSION: Hypoventilatory chest with elevation of right hemidiaphragm and adjacent linear opacities, likely atelectasis.   Electronically Signed   By: Jeb Levering M.D.   On: 08/05/2014 02:45   Ir Nephrostomy Placement Left  08/06/2014   CLINICAL DATA:  Left hydronephrosis secondary to obstructing calculus. Previous right nephrectomy for XGPN. Fever.  EXAM: LEFT PERCUTANEOUS NEPHROSTOMY CATHETER PLACEMENT UNDER ULTRASOUND AND FLUOROSCOPIC GUIDANCE  FLUOROSCOPY TIME:  5 minutes, 66 mGy  TECHNIQUE: The procedure, risks (including but not limited to bleeding, infection, organ damage ), benefits, and alternatives were explained to the patient. Questions regarding the procedure were encouraged and answered. The patient understands and consents to the procedure.  After laterality was marked, the leftFlank region prepped with Betadine, draped in usual sterile fashion, infiltrated locally with 1% lidocaine.Patient was receiving adequate prophylactic antibiotic coverage.  Intravenous Fentanyl and Versed were administered as conscious sedation during continuous cardiorespiratory monitoring by the radiology RN, with a total moderate sedation time of 16 minutes.  Under real-time ultrasound guidance, a 21-gauge trocar needle was advanced into a posterior lower pole calyx. Ultrasound image documentation was saved. Urine spontaneously returned through the needle. Needle was exchanged over a guidewire for  transitional dilator. Contrast injection confirmed appropriate positioning. Catheter was exchanged over a guidewire for a 10 French pigtail catheter, formed centrally within the left renal collecting system. Contrast injection confirms appropriate positioning and patency. Catheter secured externally with 0 Prolene suture and placed to external drain bag.  COMPLICATIONS: COMPLICATIONS none  IMPRESSION: 1. Technically successful left percutaneous nephrostomy catheter placement.   Electronically Signed   By: Lucrezia Europe M.D.   On: 08/06/2014 12:40     Labs:   Basic Metabolic Panel:  Recent Labs Lab 08/06/14 0810 08/07/14 0023 08/09/14 0335 08/09/14 0355 08/10/14 0548 08/11/14 0727  NA 142 138  --  138 138 136  K 3.8 3.3*  --  2.9* 3.2* 3.2*  CL 113* 107  --  102 103 103  CO2 20* 23  --  _0 GLUCOSE 161* 92  --  116* 101* 126*  BUN 19 12  --  _1 CREATININE 2.14* 1.41*  --  1.21* 0.97 1.05*  CALCIUM 8.5* 8.8*  --  9.2 9.0 8.8*  MG  --   --  2.1  --   --   --    GFR Estimated Creatinine Clearance: 76.1 mL/min (by C-G formula based on Cr of 1.05). Liver Function Tests:  Recent Labs Lab 08/04/14 2327 08/05/14 0500 08/06/14 0810  AST _2 ALT _3 ALKPHOS 54  49 52  BILITOT 0.9 1.2 1.1  PROT 7.8 7.2 7.3  ALBUMIN 3.5 3.0* 2.8*   Coagulation profile  Recent Labs Lab 08/05/14 0500  INR 1.19    CBC:  Recent Labs Lab 08/04/14 2327 08/05/14 0500 08/06/14 0810 08/09/14 0355  WBC 11.1* 9.6 7.0 6.5  NEUTROABS  --  7.4  --   --   HGB 12.4 11.4* 10.4* 11.2*  HCT 37.7 35.9* 32.2* 33.3*  MCV 86.5 88.0 88.0 86.0  PLT 257 236 232 254   CBG:  Recent Labs Lab 08/09/14 2344 08/10/14 0420 08/10/14 0806 08/10/14 1201 08/10/14 1610  GLUCAP 96 89 102* 94 128*   Microbiology Recent Results (from the past 240 hour(s))  Urine culture     Status: None   Collection Time: 08/04/14 11:28 PM  Result Value Ref Range Status   Specimen Description URINE, CLEAN  CATCH  Final   Special Requests NONE  Final   Culture   Final    >=100,000 COLONIES/mL ESCHERICHIA COLI Performed at [facility]    Report Status 08/09/2014 FINAL  Final   Organism ID, Bacteria ESCHERICHIA COLI  Final      Susceptibility   Escherichia coli - MIC*    AMPICILLIN <=2 SENSITIVE Sensitive     CEFAZOLIN <=4 SENSITIVE Sensitive     CEFTRIAXONE <=1 SENSITIVE Sensitive     CIPROFLOXACIN <=0.25 SENSITIVE Sensitive     GENTAMICIN <=1 SENSITIVE Sensitive     IMIPENEM <=0.25 SENSITIVE Sensitive     NITROFURANTOIN <=16 SENSITIVE Sensitive     TRIMETH/SULFA <=20 SENSITIVE Sensitive     AMPICILLIN/SULBACTAM <=2 SENSITIVE Sensitive     PIP/TAZO <=4 SENSITIVE Sensitive     * >=100,000 COLONIES/mL ESCHERICHIA COLI  Blood Culture (routine x 2)     Status: None   Collection Time: 08/05/14 12:33 AM  Result Value Ref Range Status   Specimen Description LEFT ANTECUBITAL  Final   Special Requests BOTTLES DRAWN AEROBIC AND ANAEROBIC  Final   Culture  Setup Time   Final    GRAM POSITIVE COCCI IN CLUSTERS IN BOTH AEROBIC AND ANAEROBIC BOTTLES CRITICAL RESULT CALLED TO, READ BACK BY AND VERIFIED WITH: M. REEVES RN [phone] [place]    Culture   Final    STAPHYLOCOCCUS SPECIES (COAGULASE NEGATIVE) THE SIGNIFICANCE OF ISOLATING THIS ORGANISM FROM A SINGLE SET OF BLOOD CULTURES WHEN MULTIPLE SETS ARE DRAWN IS UNCERTAIN. PLEASE NOTIFY THE MICROBIOLOGY DEPARTMENT WITHIN ONE WEEK IF SPECIATION AND SENSITIVITIES ARE REQUIRED. Performed at [facility]    Report Status 08/10/2014 FINAL  Final  Blood Culture (routine x 2)     Status: None (Preliminary result)   Collection Time: 08/05/14 12:33 AM  Result Value Ref Range Status   Specimen Description BLOOD RIGHT HAND  Final   Special Requests BOTTLES DRAWN AEROBIC AND ANAEROBIC  Final   Culture  Setup Time   Final    GRAM POSITIVE COCCI IN CLUSTERS IN BOTH AEROBIC AND ANAEROBIC BOTTLES GRAM STAIN  REVIEWED-AGREE WITH RESULT MVESTAL  CRITICAL RESULT CALLED TO, READ BACK BY AND VERIFIED WITH: L FREI,RN AT 1006 08/06/14 BY KBARR    Culture   Final    STAPHYLOCOCCUS SPECIES (COAGULASE NEGATIVE) Performed at [facility]    Report Status PENDING  Incomplete  MRSA PCR Screening     Status: None   Collection Time: 08/05/14  4:18 AM  Result Value Ref Range Status   MRSA by PCR NEGATIVE NEGATIVE  Final    Comment:        The GeneXpert MRSA Assay (FDA approved for NASAL specimens only), is one component of a comprehensive MRSA colonization surveillance program. It is not intended to diagnose MRSA infection nor to guide or monitor treatment for MRSA infections.   Urine culture     Status: None   Collection Time: 08/05/14 11:23 AM  Result Value Ref Range Status   Specimen Description URINE, RANDOM from kidney  Final   Special Requests NONE  Final   Culture   Final    NO GROWTH 1 DAY Performed at [facility]    Report Status 08/06/2014 FINAL  Final     Discharge Instructions:   Discharge Instructions    Call MD for:  extreme fatigue    Complete by:  As directed      Call MD for:  persistant nausea and vomiting    Complete by:  As directed      Call MD for:  temperature >100.4    Complete by:  As directed      Diet general    Complete by:  As directed      Discharge instructions    Complete by:  As directed   You were cared for by Dr. Jacquelynn Cree  (a hospitalist) during your hospital stay. If you have any questions about your discharge medications or the care you received while you were in the hospital after you are discharged, you can call the unit and ask to speak with the hospitalist on call if the hospitalist that took care of you is not available. Once you are discharged, your primary care physician will handle any further medical issues. Please note that NO REFILLS for any discharge medications will be authorized once you are discharged, as it is  imperative that you return to your primary care physician (or establish a relationship with a primary care physician if you do not have one) for your aftercare needs so that they can reassess your need for medications and monitor your lab values.  Any outstanding tests can be reviewed by your PCP at your follow up visit.  It is also important to review any medicine changes with your PCP.  Please bring these d/c instructions with you to your next visit so your physician can review these changes with you.  If you do not have a primary care physician, you can call [phone] for a physician referral.  It is highly recommended that you obtain a PCP for hospital follow up.     Face-to-face encounter (required for Medicare/Medicaid patients)    Complete by:  As directed   I , certify that this patient is under my care and that I, or a nurse practitioner or physician's assistant working with me, had a face-to-face encounter that meets the physician face-to-face encounter requirements with this patient on 08/11/2014. The encounter with the patient was in whole, or in part for the following medical condition(s) which is the primary reason for home health care (List medical condition): Multiple sclerosis, resume aide 6 hours/day.  Unable to provide ADLs. Very weak, unable to ambulate.  The encounter with the patient was in whole, or in part, for the following medical condition, which is the primary reason for home health care:  Multiple sclerosis  I certify that, based on my findings, the following services are medically necessary home health services:  Nursing  Reason for Medically Necessary Home Health Services:  Therapy- Home Adaptation to  Facilitate Safety  My clinical findings support the need for the above services:  Unable to leave home safely without assistance and/or assistive device  Further, I certify that my clinical findings support that this patient is homebound due to:  Unable to leave home  safely without assistance     Home Health    Complete by:  As directed   To provide the following care/treatments:  Home Health Aide     Increase activity slowly    Complete by:  As directed             Medication List    TAKE these medications        baclofen 10 MG tablet  Commonly known as:  LIORESAL  Take 10 mg by mouth 2 (two) times daily.     benztropine 1 MG tablet  Commonly known as:  COGENTIN  Take 1 mg by mouth 2 (two) times daily.     cephALEXin 500 MG capsule  Commonly known as:  KEFLEX  Take 1 capsule (500 mg total) by mouth 2 (two) times daily.     cholecalciferol 1000 UNITS tablet  Commonly known as:  VITAMIN D  Take 1,000 Units by mouth every morning.     docusate sodium 100 MG capsule  Commonly known as:  COLACE  Take 100 mg by mouth daily as needed for mild constipation.     ferrous sulfate 325 (65 FE) MG tablet  Take 1 tablet (325 mg total) by mouth 3 (three) times daily with meals.     Interferon Beta-1b 0.3 MG Kit injection  Commonly known as:  BETASERON/EXTAVIA  Inject 0.3 mg into the skin every other day.     levothyroxine 50 MCG tablet  Commonly known as:  SYNTHROID, LEVOTHROID  Take 50 mcg by mouth daily before breakfast.     omega-3 acid ethyl esters 1 G capsule  Commonly known as:  LOVAZA  Take 1 g by mouth 2 (two) times daily.     potassium chloride SA 20 MEQ tablet  Commonly known as:  K-DUR,KLOR-CON  Take 20 mEq by mouth every morning.     simvastatin 5 MG tablet  Commonly known as:  ZOCOR  Take 5 mg by mouth at bedtime.           Follow-up Information    Schedule an appointment as soon as possible for a visit with Jacqueline Poll, MD.   Specialty:  Family Medicine   Why:  As needed   Contact information:   [address] [place] [address] [phone]       Follow up with Jorja Loa, MD. Call in 2 days.   Specialty:  Urology   Why:  to schedule follow up for your kidney stones.   Contact  information:   [place] [address] [phone]        Time coordinating discharge: 35 minutes.  Signed:  ,  Pager [phone] [facility] 08/11/2014, 5:08 PM

## 2014-08-11 NOTE — Progress Notes (Signed)
Patient ID: Jacqueline Norman, female   DOB: [birth year], 44 y.o.   MRN: [id number]    Referring Physician(s):  Dahsltedt  Subjective:  Pt without new c/o  Allergies: Review of patient's allergies indicates no known allergies.  Medications: Prior to Admission medications   Medication Sig Start Date End Date Taking? Authorizing Provider  baclofen (LIORESAL) 10 MG tablet Take 10 mg by mouth 2 (two) times daily.   Yes Historical Provider, MD  benztropine (COGENTIN) 1 MG tablet Take 1 mg by mouth 2 (two) times daily.   Yes Historical Provider, MD  cholecalciferol (VITAMIN D) 1000 UNITS tablet Take 1,000 Units by mouth every morning.   Yes Historical Provider, MD  docusate sodium (COLACE) 100 MG capsule Take 100 mg by mouth daily as needed for mild constipation.   Yes Historical Provider, MD  ferrous sulfate 325 (65 FE) MG tablet Take 1 tablet (325 mg total) by mouth 3 (three) times daily with meals. 08/05/13  Yes Belkys A Regalado, MD  Interferon Beta-1b (BETASERON/EXTAVIA) 0.3 MG KIT injection Inject 0.3 mg into the skin every other day.   Yes Historical Provider, MD  levothyroxine (SYNTHROID, LEVOTHROID) 50 MCG tablet Take 50 mcg by mouth daily before breakfast.   Yes Historical Provider, MD  omega-3 acid ethyl esters (LOVAZA) 1 G capsule Take 1 g by mouth 2 (two) times daily.    Yes Historical Provider, MD  potassium chloride SA (K-DUR,KLOR-CON) 20 MEQ tablet Take 20 mEq by mouth every morning.   Yes Historical Provider, MD  simvastatin (ZOCOR) 5 MG tablet Take 5 mg by mouth at bedtime.    Yes Historical Provider, MD     Vital Signs: BP 114/76 mmHg  Pulse 85  Temp(Src) 98.2 F (36.8 C) (Oral)  Resp 20  Ht _0  (1.651 m)  Wt 196 lb 6.9 oz (89.1 kg)  BMI 32.69 kg/m2  SpO2 100%  LMP   Physical Exam left PCN intact, output 2 liters; insertion site clean and dry, NT, small amt clot noted in tubing, PCN flushed with return of light yellow urine  Imaging: No results  found.  Labs:  CBC:  Recent Labs  08/04/14 2327 08/05/14 0500 08/06/14 0810 08/09/14 0355  WBC 11.1* 9.6 7.0 6.5  HGB 12.4 11.4* 10.4* 11.2*  HCT 37.7 35.9* 32.2* 33.3*  PLT 257 236 232 254    COAGS:  Recent Labs  08/05/14 0500  INR 1.19  APTT 31    BMP:  Recent Labs  08/07/14 0023 08/09/14 0355 08/10/14 0548 08/11/14 0727  NA 138 138 138 136  K 3.3* 2.9* 3.2* 3.2*  CL 107 102 103 103  CO2 _1 GLUCOSE 92 116* 101* 126*  BUN _2 CALCIUM 8.8* 9.2 9.0 8.8*  CREATININE 1.41* 1.21* 0.97 1.05*  GFRNONAA 45* 54* >60 >60  GFRAA 52* >60 >60 >60    LIVER FUNCTION TESTS:  Recent Labs  08/04/14 2327 08/05/14 0500 08/06/14 0810  BILITOT 0.9 1.2 1.1  AST _3 ALT _4 ALKPHOS 54 49 52  PROT 7.8 7.2 7.3  ALBUMIN 3.5 3.0* 2.8*    Assessment and Plan: S/p left PCN 6/28 secondary to obstructive hydronephrosis/stone/sepsis/ARF; VSS; AF; creat 1.05, K 3.2; replace K; cont PCN as per urology until definitive stone treatment determined   Signed: D. Rowe Robert 08/11/2014, 11:53 AM   I spent a total of 15 minutes in face to face in clinical consultation/evaluation, greater than  50% of which was counseling/coordinating care for left perc nephrostomy

## 2014-08-11 NOTE — Care Management Note (Signed)
Case Management Note  Patient Details  Name: JANESSA WUERTZ MRN: [id number] Date of Birth: 10/21/1970  Subjective/Objective:                  Sepsis  Action/Plan:  Discharge planning Expected Discharge Date:  08/11/14               Expected Discharge Plan:  Home w Home Health Services  In-House Referral:  NA  Discharge planning Services  CM Consult  Post Acute Care Choice:  NA Choice offered to:  NA  DME Arranged:  N/A DME Agency:  NA  HH Arranged:  RN, Nurse's Aide HH Agency:     Status of Service:  Completed, signed off  Medicare Important Message Given:  Yes-second notification given Date Medicare IM Given:    Medicare IM give by:    Date Additional Medicare IM Given:    Additional Medicare Important Message give by:     If discussed at Long Length of Stay Meetings, dates discussed:    Additional Comments: CM received call from RN to please arrange HHRN/aide with [facility].  Referral called to [facility] rep, Tiffany.  No other Cm needs were communicated. Yves Dill, RN 08/11/2014, 5:25 PM

## 2014-08-11 NOTE — Progress Notes (Signed)
INFECTIOUS DISEASE PROGRESS NOTE  ID: Jacqueline Norman is a 44 y.o. female with  Principal Problem:   Sepsis secondary to coagulase-negative Staphylococcus bacteremia Active Problems:   Perinephric abscess   Multiple sclerosis   Hypokalemia   Renal stone   Acute encephalopathy   Acute renal failure   Hydronephrosis with renal and ureteral calculus obstruction   Nephrolithiasis   AKI (acute kidney injury)  Subjective: Without complaints.   Abtx:  Anti-infectives    Start     Dose/Rate Route Frequency Ordered Stop   08/10/14 1000  vancomycin (VANCOCIN) 1,500 mg in sodium chloride 0.9 % 500 mL IVPB     1,500 mg 250 mL/hr over 120 Minutes Intravenous Every 24 hours 08/09/14 1841     08/09/14 1600  ceFAZolin (ANCEF) IVPB 1 g/50 mL premix     1 g 100 mL/hr over 30 Minutes Intravenous 3 times per day 08/09/14 1504     08/07/14 1800  vancomycin (VANCOCIN) IVPB 750 mg/150 ml premix  Status:  Discontinued     750 mg 150 mL/hr over 60 Minutes Intravenous Every 12 hours 08/07/14 1048 08/09/14 1841   08/06/14 0600  vancomycin (VANCOCIN) IVPB 1000 mg/200 mL premix  Status:  Discontinued     1,000 mg 200 mL/hr over 60 Minutes Intravenous Every 24 hours 08/05/14 0420 08/07/14 1048   08/05/14 0345  piperacillin-tazobactam (ZOSYN) IVPB 3.375 g  Status:  Discontinued     3.375 g 12.5 mL/hr over 240 Minutes Intravenous 3 times per day 08/05/14 0343 08/09/14 1504   08/05/14 0345  vancomycin (VANCOCIN) IVPB 1000 mg/200 mL premix     1,000 mg 200 mL/hr over 60 Minutes Intravenous  Once 08/05/14 0343 08/05/14 0537   08/05/14 0130  cefTRIAXone (ROCEPHIN) 1 g in dextrose 5 % 50 mL IVPB     1 g 100 mL/hr over 30 Minutes Intravenous  Once 08/05/14 0116 08/05/14 0313      Medications:  Scheduled: . antiseptic oral rinse  7 mL Mouth Rinse BID  . baclofen  10 mg Oral BID  . benztropine  1 mg Oral BID  .  ceFAZolin (ANCEF) IV  1 g Intravenous 3 times per day  . ferrous sulfate  325 mg Oral TID  WC  . Interferon Beta-1b  0.3 mg Subcutaneous QODAY  . levothyroxine  50 mcg Oral QAC breakfast  . omega-3 acid ethyl esters  1 g Oral BID  . potassium chloride SA  20 mEq Oral TID  . simvastatin  5 mg Oral QHS  . vancomycin  1,500 mg Intravenous Q24H    Objective: Vital signs in last 24 hours: Temp:  [98 F (36.7 C)-98.3 F (36.8 C)] 98 F (36.7 C) (07/03 1415) Pulse Rate:  [82-87] 82 (07/03 1415) Resp:  [18-20] 18 (07/03 1415) BP: (114-121)/(68-76) 115/68 mmHg (07/03 1415) SpO2:  [99 %-100 %] 100 % (07/03 1415)   General appearance: alert, cooperative and no distress Resp: clear to auscultation bilaterally Cardio: regular rate and rhythm GI: normal findings: bowel sounds normal and soft, non-tender  Lab Results  Recent Labs  08/09/14 0355 08/10/14 0548 08/11/14 0727  WBC 6.5  --   --   HGB 11.2*  --   --   HCT 33.3*  --   --   NA 138 138 136  K 2.9* 3.2* 3.2*  CL 102 103 103  CO2 26 25 24   BUN 8 7 6   CREATININE 1.21* 0.97 1.05*   Liver Panel No results  for input(s): PROT, ALBUMIN, AST, ALT, ALKPHOS, BILITOT, BILIDIR, IBILI in the last 72 hours. Sedimentation Rate No results for input(s): ESRSEDRATE in the last 72 hours. C-Reactive Protein No results for input(s): CRP in the last 72 hours.  Microbiology: Recent Results (from the past 240 hour(s))  Urine culture     Status: None   Collection Time: 08/04/14 11:28 PM  Result Value Ref Range Status   Specimen Description URINE, CLEAN CATCH  Final   Special Requests NONE  Final   Culture   Final    >=100,000 COLONIES/mL ESCHERICHIA COLI Performed at [facility]    Report Status 08/09/2014 FINAL  Final   Organism ID, Bacteria ESCHERICHIA COLI  Final      Susceptibility   Escherichia coli - MIC*    AMPICILLIN <=2 SENSITIVE Sensitive     CEFAZOLIN <=4 SENSITIVE Sensitive     CEFTRIAXONE <=1 SENSITIVE Sensitive     CIPROFLOXACIN <=0.25 SENSITIVE Sensitive     GENTAMICIN <=1 SENSITIVE Sensitive      IMIPENEM <=0.25 SENSITIVE Sensitive     NITROFURANTOIN <=16 SENSITIVE Sensitive     TRIMETH/SULFA <=20 SENSITIVE Sensitive     AMPICILLIN/SULBACTAM <=2 SENSITIVE Sensitive     PIP/TAZO <=4 SENSITIVE Sensitive     * >=100,000 COLONIES/mL ESCHERICHIA COLI  Blood Culture (routine x 2)     Status: None   Collection Time: 08/05/14 12:33 AM  Result Value Ref Range Status   Specimen Description LEFT ANTECUBITAL  Final   Special Requests BOTTLES DRAWN AEROBIC AND ANAEROBIC  Final   Culture  Setup Time   Final    GRAM POSITIVE COCCI IN CLUSTERS IN BOTH AEROBIC AND ANAEROBIC BOTTLES CRITICAL RESULT CALLED TO, READ BACK BY AND VERIFIED WITH: M. REEVES RN [phone] GREEN R CONFIRMED BY R. HOLMES    Culture   Final    STAPHYLOCOCCUS SPECIES (COAGULASE NEGATIVE) THE SIGNIFICANCE OF ISOLATING THIS ORGANISM FROM A SINGLE SET OF BLOOD CULTURES WHEN MULTIPLE SETS ARE DRAWN IS UNCERTAIN. PLEASE NOTIFY THE MICROBIOLOGY DEPARTMENT WITHIN ONE WEEK IF SPECIATION AND SENSITIVITIES ARE REQUIRED. Performed at [facility]    Report Status 08/10/2014 FINAL  Final  Blood Culture (routine x 2)     Status: None (Preliminary result)   Collection Time: 08/05/14 12:33 AM  Result Value Ref Range Status   Specimen Description BLOOD RIGHT HAND  Final   Special Requests BOTTLES DRAWN AEROBIC AND ANAEROBIC  Final   Culture  Setup Time   Final    GRAM POSITIVE COCCI IN CLUSTERS IN BOTH AEROBIC AND ANAEROBIC BOTTLES GRAM STAIN REVIEWED-AGREE WITH RESULT MVESTAL  CRITICAL RESULT CALLED TO, READ BACK BY AND VERIFIED WITH: L FREI,RN AT 1006 08/06/14 BY KBARR    Culture   Final    STAPHYLOCOCCUS SPECIES (COAGULASE NEGATIVE) Performed at [facility]    Report Status PENDING  Incomplete  MRSA PCR Screening     Status: None   Collection Time: 08/05/14  4:18 AM  Result Value Ref Range Status   MRSA by PCR NEGATIVE NEGATIVE Final    Comment:        The GeneXpert MRSA Assay (FDA approved for NASAL  specimens only), is one component of a comprehensive MRSA colonization surveillance program. It is not intended to diagnose MRSA infection nor to guide or monitor treatment for MRSA infections.   Urine culture     Status: None   Collection Time: 08/05/14 11:23 AM  Result Value Ref Range Status  Specimen Description URINE, RANDOM from kidney  Final   Special Requests NONE  Final   Culture   Final    NO GROWTH 1 DAY Performed at [facility]    Report Status 08/06/2014 FINAL  Final    Studies/Results: No results found.   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  Would keep her on kelfex (orally) til she has her stone removed.  available if questions.          Johny Sax Infectious Diseases (pager) [phone] [email or web address] 08/11/2014, 4:43 PM  LOS: 6 days

## 2014-08-11 NOTE — Progress Notes (Addendum)
Progress Note   Jacqueline Norman:[id number] DOB: 1970/02/25 DOA: 08/04/2014 PCP: Florentina Jenny, MD   Brief Narrative:   Jacqueline Norman is an 44 y.o. 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  Assessment/Plan:   Principal Problem:   Sepsis secondary to coagulase-negative Staphylococcus bacteremia and Escherichia coli UTI/ pyelonephritis/ perinephric abscess - CT scan done on admission showed perinephric stranding. Urine culture positive for Escherichia coli. - Blood cultures positive for coagulase-negative Staphylococcus, sensitivities pending. - Continue vancomycin and Ancef. - She will need to remain on oral antibiotics until her kidney stone has been removed. - She will need to be followed closely to ensure that the fluid collection seen on CT resolves without the need for drainage.  Active Problems:   Acute renal failure with obstruction of solitary left kidney secondary to a large left renal stone /left hydronephrosis - CT of the abdomen done on admission showed a 17 mm left UPJ stone as well as left lower pole calculus.  - Baseline creatinine less than 1, creatinine on admission 3.98. - Underwent placement of a nephrectomy tube 08/05/14, creatinine has steadily improved and is now back to usual baseline values.    Multiple sclerosis - Continue interferon beta and baclofen.    Hypothyroidism - Continue Synthroid.    Acute encephalopathy - EEG unremarkable. Likely secondary to sepsis syndrome. Resolved.    Hypokalemia - Repleted 08/10/14.    DVT Prophylaxis - Continue SCDs.  Family Communication: Mother updated at the bedside. Disposition Plan: Home when culture and sensitivities finalized, likely in the next 24 hours. Code Status:     Code Status Orders        Start     Ordered   08/05/14  1905  Full code   Continuous     08/05/14 1904        IV Access:    Peripheral IV   Procedures and diagnostic studies:   Ct Abdomen Pelvis Wo Contrast  08/05/2014   CLINICAL DATA:  Sepsis.  Leukocytosis.  EXAM: CT ABDOMEN AND PELVIS WITHOUT CONTRAST  TECHNIQUE: Multidetector CT imaging of the abdomen and pelvis was performed following the standard protocol without IV contrast.  COMPARISON:  08/29/2013  FINDINGS: There is a 1.7 cm calculus in the left renal pelvis. There is another 1.7 cm calculus in the lower pole left collecting system. These are new from 08/29/2013. There is moderate left hydronephrosis. There is left perinephric stranding. The left ureter is normal in caliber, with no ureteral calculi.  In the right nephrectomy bed, there is a 2.4 x 4.3 by 3.7 cm complex -appearing collection which could represent residual abscess or residual inflammation. The nephrectomy bed is otherwise unremarkable.  There is cholelithiasis.  There are unremarkable unenhanced appearances of the liver, spleen, pancreas and adrenals.  Bowel is unremarkable.  Uterus and ovaries appear unremarkable.  Abdominal aorta is normal in caliber with mild atherosclerotic calcification.  There is mild right lung base opacity which could be atelectatic or infectious. There are no effusions.  No significant musculoskeletal lesion is evident. Moderately severe degenerative disc and facet changes are present in the lower lumbar spine. There is a bilateral L5 spondylolysis with minimal spondylolisthesis.  IMPRESSION: 1. New left nephrolithiasis and hydronephrosis. No ureteral calculi are evident. There is a 1.7 cm left renal pelvic calculus and another 1.7 cm left lower  pole collecting system calculus. 2. 2.4 x 3.7 x 4.3 cm right nephrectomy bed collection which could represent residual abscess or inflammation. 3. Cholelithiasis without evidence of an acute process in the biliary system. 4. Mild right lung base opacity due to  atelectasis or infection. 5. Incidentally noted spondylolysis and minimal spondylolisthesis at L5. Lower lumbar degenerative disc and facet changes.   Electronically Signed   By: Ellery Plunk M.D.   On: 08/05/2014 03:12   Dg Abd 1 View  08/06/2014   CLINICAL DATA:  Decreased output from the left-sided nephrostomy tube ; assess placement  EXAM: ABDOMEN - 1 VIEW  COMPARISON:  None  FINDINGS: A left nephrostomy tube is partially imaged. The tip of the tube overlies the left renal fossa. Its more lateral portion is not included in the field of view.  The bowel gas pattern is unremarkable. There are numerous phleboliths within the pelvis. There are degenerative changes of both hips.  IMPRESSION: The visualized portions of the nephrostomy tube project over the midpole region of the left kidney. Further evaluation with a nephrostogram, CT scanning, or ultrasound could be considered.   Electronically Signed   By: David  [place] M.D.   On: 08/06/2014 08:53   Ct Head Wo Contrast  08/05/2014   CLINICAL DATA:  Altered mental status. History of multiple sclerosis. Mental status changes began at 19:00.  EXAM: CT HEAD WITHOUT CONTRAST  TECHNIQUE: Contiguous axial images were obtained from the base of the skull through the vertex without intravenous contrast.  COMPARISON:  None.  FINDINGS: There is no intracranial hemorrhage, mass or evidence of acute infarction. There is no extra-axial fluid collection. There is moderate generalized atrophy, greatest in the frontal lobes. There is extensive cerebral white matter hypodensity, greatest in the periventricular regions, nonspecific but consistent with the described history of long-standing demyelinating disease. No acute intracranial findings are evident. No bony abnormality is evident.  IMPRESSION: Severe white matter hypodensities in both cerebral hemispheres, consistent with the described history of longstanding demyelinating disease. Moderate generalized atrophy, greatest  in the frontal lobes. No acute findings are evident.   Electronically Signed   By: Ellery Plunk M.D.   On: 08/05/2014 00:41   US Renal  08/05/2014   CLINICAL DATA:  Renal insufficiency.  Right nephrectomy.  EXAM: RENAL / URINARY TRACT ULTRASOUND COMPLETE  COMPARISON:  CT 08/29/2013  FINDINGS: Right Kidney:  Absent.  Prior nephrectomy.  Left Kidney:  Length: 14.6 cm. There is a lower pole left renal calculus with branching morphology suggesting early staghorn formation. No hydronephrosis.  Bladder:  Grossly normal with limited visibility due to low volume.  IMPRESSION: Lower pole left nephrolithiasis with branching morphology suggesting early staghorn. No hydronephrosis.   Electronically Signed   By: Ellery Plunk M.D.   On: 08/05/2014 02:52   Dg Chest Port 1 View  08/05/2014   CLINICAL DATA:  Sepsis.  EXAM: PORTABLE CHEST - 1 VIEW  COMPARISON:  02/03/2004  FINDINGS: There is elevation of right hemidiaphragm with adjacent linear opacity, likely atelectasis. Lung volumes are low. Cardiomediastinal contours are normal for technique. No large pleural effusion or pneumothorax. No pulmonary edema.  IMPRESSION: Hypoventilatory chest with elevation of right hemidiaphragm and adjacent linear opacities, likely atelectasis.   Electronically Signed   By: Rubye Oaks M.D.   On: 08/05/2014 02:45   Ir Nephrostomy Placement Left  08/06/2014   CLINICAL DATA:  Left hydronephrosis secondary to obstructing calculus. Previous right nephrectomy for XGPN. Fever.  EXAM: LEFT PERCUTANEOUS NEPHROSTOMY  CATHETER PLACEMENT UNDER ULTRASOUND AND FLUOROSCOPIC GUIDANCE  FLUOROSCOPY TIME:  5 minutes, 66 mGy  TECHNIQUE: The procedure, risks (including but not limited to bleeding, infection, organ damage ), benefits, and alternatives were explained to the patient. Questions regarding the procedure were encouraged and answered. The patient understands and consents to the procedure.  After laterality was marked, the leftFlank  region prepped with Betadine, draped in usual sterile fashion, infiltrated locally with 1% lidocaine.Patient was receiving adequate prophylactic antibiotic coverage.  Intravenous Fentanyl and Versed were administered as conscious sedation during continuous cardiorespiratory monitoring by the radiology RN, with a total moderate sedation time of 16 minutes.  Under real-time ultrasound guidance, a 21-gauge trocar needle was advanced into a posterior lower pole calyx. Ultrasound image documentation was saved. Urine spontaneously returned through the needle. Needle was exchanged over a guidewire for transitional dilator. Contrast injection confirmed appropriate positioning. Catheter was exchanged over a guidewire for a 10 French pigtail catheter, formed centrally within the left renal collecting system. Contrast injection confirms appropriate positioning and patency. Catheter secured externally with 0 Prolene suture and placed to external drain bag.  COMPLICATIONS: COMPLICATIONS none  IMPRESSION: 1. Technically successful left percutaneous nephrostomy catheter placement.   Electronically Signed   By: Corlis Leak M.D.   On: 08/06/2014 12:40     Medical Consultants:    Infectious Disease  Urology  Anti-Infectives:    Vancomycin 08/05/14--->  Zosyn 08/05/14--->08/09/14  Ancef 08/09/14--->  Subjective:   Kathlee Nations Stebbins denies pain, nausea, vomiting.  Appetite good.  Bowels moved today.  Objective:    Filed Vitals:   08/10/14 0422 08/10/14 1355 08/10/14 2024 08/11/14 0610  BP: 123/73 117/75 121/76 114/76  Pulse: 108 99 87 85  Temp: 98.7 F (37.1 C) 98.6 F (37 C) 98.3 F (36.8 C) 98.2 F (36.8 C)  TempSrc: Oral Axillary Oral Oral  Resp: 18 20 20 20   Height:      Weight:      SpO2: 99% 98% 99% 100%    Intake/Output Summary (Last 24 hours) at 08/11/14 0758 Last data filed at 08/11/14 0516  Gross per 24 hour  Intake   1535 ml  Output   2050 ml  Net   -515 ml    Exam: Gen:  NAD,  alert Cardiovascular:  RRR, No M/R/G Respiratory:  Lungs CTAB Gastrointestinal:  Abdomen soft, NT/ND, + BS Extremities:  No C/E/C Neuro: Generalized weakness   Data Reviewed:    Labs: Basic Metabolic Panel:  Recent Labs Lab 08/05/14 0500 08/06/14 0810 08/07/14 0023 08/09/14 0335 08/09/14 0355 08/10/14 0548  NA 138 142 138  --  138 138  K 4.5 3.8 3.3*  --  2.9* 3.2*  CL 109 113* 107  --  102 103  CO2 19* 20* 23  --  26 25  GLUCOSE 174* 161* 92  --  116* 101*  BUN 27* 19 12  --  8 7  CREATININE 3.98* 2.14* 1.41*  --  1.21* 0.97  CALCIUM 8.5* 8.5* 8.8*  --  9.2 9.0  MG  --   --   --  2.1  --   --    GFR Estimated Creatinine Clearance: 82.4 mL/min (by C-G formula based on Cr of 0.97). Liver Function Tests:  Recent Labs Lab 08/04/14 2327 08/05/14 0500 08/06/14 0810  AST 21 18 17   ALT 18 15 16   ALKPHOS 54 49 52  BILITOT 0.9 1.2 1.1  PROT 7.8 7.2 7.3  ALBUMIN 3.5 3.0* 2.8*  Coagulation profile  Recent Labs Lab 08/05/14 0500  INR 1.19    CBC:  Recent Labs Lab 08/04/14 2327 08/05/14 0500 08/06/14 0810 08/09/14 0355  WBC 11.1* 9.6 7.0 6.5  NEUTROABS  --  7.4  --   --   HGB 12.4 11.4* 10.4* 11.2*  HCT 37.7 35.9* 32.2* 33.3*  MCV 86.5 88.0 88.0 86.0  PLT 257 236 232 254   CBG:  Recent Labs Lab 08/09/14 2344 08/10/14 0420 08/10/14 0806 08/10/14 1201 08/10/14 1610  GLUCAP 96 89 102* 94 128*   Sepsis Labs:  Recent Labs Lab 08/04/14 2327 08/04/14 2341 08/05/14 0500 08/06/14 0810 08/09/14 0355  PROCALCITON  --   --  0.67  --   --   WBC 11.1*  --  9.6 7.0 6.5  LATICACIDVEN  --  1.19 1.1  --   --    Microbiology Recent Results (from the past 240 hour(s))  Urine culture     Status: None   Collection Time: 08/04/14 11:28 PM  Result Value Ref Range Status   Specimen Description URINE, CLEAN CATCH  Final   Special Requests NONE  Final   Culture   Final    >=100,000 COLONIES/mL ESCHERICHIA COLI Performed at [facility]     Report Status 08/09/2014 FINAL  Final   Organism ID, Bacteria ESCHERICHIA COLI  Final      Susceptibility   Escherichia coli - MIC*    AMPICILLIN <=2 SENSITIVE Sensitive     CEFAZOLIN <=4 SENSITIVE Sensitive     CEFTRIAXONE <=1 SENSITIVE Sensitive     CIPROFLOXACIN <=0.25 SENSITIVE Sensitive     GENTAMICIN <=1 SENSITIVE Sensitive     IMIPENEM <=0.25 SENSITIVE Sensitive     NITROFURANTOIN <=16 SENSITIVE Sensitive     TRIMETH/SULFA <=20 SENSITIVE Sensitive     AMPICILLIN/SULBACTAM <=2 SENSITIVE Sensitive     PIP/TAZO <=4 SENSITIVE Sensitive     * >=100,000 COLONIES/mL ESCHERICHIA COLI  Blood Culture (routine x 2)     Status: None   Collection Time: 08/05/14 12:33 AM  Result Value Ref Range Status   Specimen Description LEFT ANTECUBITAL  Final   Special Requests BOTTLES DRAWN AEROBIC AND ANAEROBIC  Final   Culture  Setup Time   Final    GRAM POSITIVE COCCI IN CLUSTERS IN BOTH AEROBIC AND ANAEROBIC BOTTLES CRITICAL RESULT CALLED TO, READ BACK BY AND VERIFIED WITH: M. REEVES RN [phone] GREEN R CONFIRMED BY R. HOLMES    Culture   Final    STAPHYLOCOCCUS SPECIES (COAGULASE NEGATIVE) THE SIGNIFICANCE OF ISOLATING THIS ORGANISM FROM A SINGLE SET OF BLOOD CULTURES WHEN MULTIPLE SETS ARE DRAWN IS UNCERTAIN. PLEASE NOTIFY THE MICROBIOLOGY DEPARTMENT WITHIN ONE WEEK IF SPECIATION AND SENSITIVITIES ARE REQUIRED. Performed at [facility]    Report Status 08/10/2014 FINAL  Final  Blood Culture (routine x 2)     Status: None (Preliminary result)   Collection Time: 08/05/14 12:33 AM  Result Value Ref Range Status   Specimen Description BLOOD RIGHT HAND  Final   Special Requests BOTTLES DRAWN AEROBIC AND ANAEROBIC  Final   Culture  Setup Time   Final    GRAM POSITIVE COCCI IN CLUSTERS IN BOTH AEROBIC AND ANAEROBIC BOTTLES GRAM STAIN REVIEWED-AGREE WITH RESULT MVESTAL  CRITICAL RESULT CALLED TO, READ BACK BY AND VERIFIED WITH: L FREI,RN AT 1006 08/06/14 BY KBARR    Culture   Final     STAPHYLOCOCCUS SPECIES (COAGULASE NEGATIVE) Performed at [facility]  Report Status PENDING  Incomplete  MRSA PCR Screening     Status: None   Collection Time: 08/05/14  4:18 AM  Result Value Ref Range Status   MRSA by PCR NEGATIVE NEGATIVE Final    Comment:        The GeneXpert MRSA Assay (FDA approved for NASAL specimens only), is one component of a comprehensive MRSA colonization surveillance program. It is not intended to diagnose MRSA infection nor to guide or monitor treatment for MRSA infections.   Urine culture     Status: None   Collection Time: 08/05/14 11:23 AM  Result Value Ref Range Status   Specimen Description URINE, RANDOM from kidney  Final   Special Requests NONE  Final   Culture   Final    NO GROWTH 1 DAY Performed at [facility]    Report Status 08/06/2014 FINAL  Final     Medications:   . antiseptic oral rinse  7 mL Mouth Rinse BID  . baclofen  10 mg Oral BID  . benztropine  1 mg Oral BID  .  ceFAZolin (ANCEF) IV  1 g Intravenous 3 times per day  . ferrous sulfate  325 mg Oral TID WC  . Interferon Beta-1b  0.3 mg Subcutaneous QODAY  . levothyroxine  50 mcg Oral QAC breakfast  . omega-3 acid ethyl esters  1 g Oral BID  . potassium chloride SA  20 mEq Oral q morning - 10a  . simvastatin  5 mg Oral QHS  . vancomycin  1,500 mg Intravenous Q24H   Continuous Infusions:   Time spent: 25 minutes.   LOS: 6 days   ,  [facility] Pager [phone]. If unable to reach me by pager, please call my cell phone at [phone].  *Please refer to [email or web address], password [email or web address] to get updated schedule on who will round on this patient, as hospitalists switch teams weekly. If 7PM-7AM, please contact night-coverage at [email or web address], password [email or web address] for any overnight needs.  08/11/2014, 7:58 AM

## 2014-08-29 ENCOUNTER — Other Ambulatory Visit: Payer: Self-pay | Admitting: Urology

## 2014-09-02 ENCOUNTER — Encounter (HOSPITAL_COMMUNITY): Payer: Self-pay | Admitting: *Deleted

## 2014-09-02 NOTE — Progress Notes (Signed)
Please put orders in Epic for Same day 09-13-14 Thanks

## 2014-09-03 ENCOUNTER — Encounter (HOSPITAL_COMMUNITY): Payer: Self-pay | Admitting: *Deleted

## 2014-09-03 ENCOUNTER — Other Ambulatory Visit: Payer: Self-pay | Admitting: Urology

## 2014-09-12 MED ORDER — GENTAMICIN SULFATE 40 MG/ML IJ SOLN
380.0000 mg | INTRAVENOUS | Status: AC
  Administered 2014-09-13: 380 mg via INTRAVENOUS
  Filled 2014-09-12: qty 9.5

## 2014-09-13 ENCOUNTER — Encounter (HOSPITAL_COMMUNITY): Payer: Self-pay | Admitting: *Deleted

## 2014-09-13 ENCOUNTER — Encounter (HOSPITAL_COMMUNITY)
Admission: RE | Disposition: A | Discharge status: Home Health | Payer: Self-pay | Source: Ambulatory Visit | Attending: Urology

## 2014-09-13 ENCOUNTER — Inpatient Hospital Stay (HOSPITAL_COMMUNITY): Payer: Medicare Other

## 2014-09-13 ENCOUNTER — Inpatient Hospital Stay (HOSPITAL_COMMUNITY): Payer: Medicare Other | Admitting: Certified Registered Nurse Anesthetist

## 2014-09-13 ENCOUNTER — Inpatient Hospital Stay (HOSPITAL_COMMUNITY)
Admission: RE | Admit: 2014-09-13 | Discharge: 2014-09-14 | DRG: 659 | Disposition: A | Discharge status: Home Health | Payer: Medicare Other | Source: Ambulatory Visit | Attending: Urology | Admitting: Urology

## 2014-09-13 DIAGNOSIS — N179 Acute kidney failure, unspecified: Secondary | ICD-10-CM | POA: Diagnosis present

## 2014-09-13 DIAGNOSIS — N202 Calculus of kidney with calculus of ureter: Principal | ICD-10-CM | POA: Diagnosis present

## 2014-09-13 DIAGNOSIS — Z87891 Personal history of nicotine dependence: Secondary | ICD-10-CM

## 2014-09-13 DIAGNOSIS — Z6832 Body mass index (BMI) 32.0-32.9, adult: Secondary | ICD-10-CM | POA: Diagnosis not present

## 2014-09-13 DIAGNOSIS — N3090 Cystitis, unspecified without hematuria: Secondary | ICD-10-CM | POA: Diagnosis present

## 2014-09-13 DIAGNOSIS — D649 Anemia, unspecified: Secondary | ICD-10-CM | POA: Diagnosis present

## 2014-09-13 DIAGNOSIS — N12 Tubulo-interstitial nephritis, not specified as acute or chronic: Secondary | ICD-10-CM | POA: Diagnosis present

## 2014-09-13 DIAGNOSIS — N138 Other obstructive and reflux uropathy: Secondary | ICD-10-CM | POA: Diagnosis present

## 2014-09-13 DIAGNOSIS — E039 Hypothyroidism, unspecified: Secondary | ICD-10-CM | POA: Diagnosis present

## 2014-09-13 DIAGNOSIS — R532 Functional quadriplegia: Secondary | ICD-10-CM | POA: Diagnosis present

## 2014-09-13 DIAGNOSIS — Z01818 Encounter for other preprocedural examination: Secondary | ICD-10-CM

## 2014-09-13 DIAGNOSIS — Z419 Encounter for procedure for purposes other than remedying health state, unspecified: Secondary | ICD-10-CM

## 2014-09-13 DIAGNOSIS — G35 Multiple sclerosis: Secondary | ICD-10-CM | POA: Diagnosis present

## 2014-09-13 DIAGNOSIS — N200 Calculus of kidney: Secondary | ICD-10-CM

## 2014-09-13 HISTORY — PX: NEPHROLITHOTOMY: SHX5134

## 2014-09-13 LAB — BASIC METABOLIC PANEL
ANION GAP: 5 (ref 5–15)
Anion gap: 12 (ref 5–15)
BUN: 9 mg/dL (ref 6–20)
BUN: 8 mg/dL (ref 6–20)
CO2: 25 mmol/L (ref 22–32)
CO2: 20 mmol/L — AB (ref 22–32)
Calcium: 8.9 mg/dL (ref 8.9–10.3)
Calcium: 8.8 mg/dL — ABNORMAL LOW (ref 8.9–10.3)
Chloride: 104 mmol/L (ref 101–111)
Chloride: 104 mmol/L (ref 101–111)
Creatinine, Ser: 1.11 mg/dL — ABNORMAL HIGH (ref 0.44–1.00)
Creatinine, Ser: 1.05 mg/dL — ABNORMAL HIGH (ref 0.44–1.00)
GFR calc Af Amer: >60 mL/min (ref >60)
GFR calc non Af Amer: >60 mL/min (ref >60)
GFR, EST NON AFRICAN AMERICAN: 60 mL/min — AB (ref >60)
GLUCOSE: 106 mg/dL — AB (ref 65–99)
Glucose, Bld: 100 mg/dL — ABNORMAL HIGH (ref 65–99)
Potassium: 3.9 mmol/L (ref 3.5–5.1)
Potassium: 4.4 mmol/L (ref 3.5–5.1)
Sodium: 134 mmol/L — ABNORMAL LOW (ref 135–145)
Sodium: 136 mmol/L (ref 135–145)

## 2014-09-13 LAB — HEMOGLOBIN AND HEMATOCRIT, BLOOD
HEMATOCRIT: 36.9 % (ref 36.0–46.0)
Hemoglobin: 12.1 g/dL (ref 12.0–15.0)

## 2014-09-13 LAB — CBC
HCT: 39.1 % (ref 36.0–46.0)
Hemoglobin: 12.1 g/dL (ref 12.0–15.0)
MCH: 27.1 pg (ref 26.0–34.0)
MCHC: 30.9 g/dL (ref 30.0–36.0)
MCV: 87.7 fL (ref 78.0–100.0)
Platelets: 196 10*3/uL (ref 150–400)
RBC: 4.46 MIL/uL (ref 3.87–5.11)
RDW: 17.6 % — ABNORMAL HIGH (ref 11.5–15.5)
WBC: 4 10*3/uL (ref 4.0–10.5)

## 2014-09-13 LAB — TYPE AND SCREEN
ABO/RH(D): O POS
Antibody Screen: NEGATIVE

## 2014-09-13 LAB — HCG, SERUM, QUALITATIVE: Preg, Serum: NEGATIVE

## 2014-09-13 SURGERY — NEPHROLITHOTOMY PERCUTANEOUS
Anesthesia: General | Laterality: Left

## 2014-09-13 MED ORDER — SODIUM CHLORIDE 0.9 % IR SOLN
Status: DC | PRN
Start: 2014-09-13 — End: 2014-09-13
  Administered 2014-09-13: 6000 mL

## 2014-09-13 MED ORDER — CEFPODOXIME PROXETIL 100 MG PO TABS: 100.0000 mg | ORAL_TABLET | Freq: Two times a day (BID) | ORAL | Status: DC

## 2014-09-13 MED ORDER — MIDAZOLAM HCL 2 MG/2ML IJ SOLN
INTRAMUSCULAR | Status: AC
  Filled 2014-09-13: qty 4

## 2014-09-13 MED ORDER — LACTATED RINGERS IV SOLN
INTRAVENOUS | Status: DC | PRN
  Administered 2014-09-13 (×2): via INTRAVENOUS

## 2014-09-13 MED ORDER — LEVOTHYROXINE SODIUM 50 MCG PO TABS
50.0000 ug | ORAL_TABLET | Freq: Every day | ORAL | Status: DC
  Administered 2014-09-14: 50 ug via ORAL
  Filled 2014-09-13 (×2): qty 1

## 2014-09-13 MED ORDER — MEPERIDINE HCL 50 MG/ML IJ SOLN: 6.2500 mg | INTRAMUSCULAR | Status: DC | PRN

## 2014-09-13 MED ORDER — ROCURONIUM BROMIDE 100 MG/10ML IV SOLN
INTRAVENOUS | Status: AC
  Filled 2014-09-13: qty 1

## 2014-09-13 MED ORDER — MIDAZOLAM HCL 5 MG/5ML IJ SOLN
INTRAMUSCULAR | Status: DC | PRN
  Administered 2014-09-13 (×2): 1 mg via INTRAVENOUS

## 2014-09-13 MED ORDER — PIPERACILLIN-TAZOBACTAM 3.375 G IVPB
3.3750 g | Freq: Three times a day (TID) | INTRAVENOUS | Status: DC
  Administered 2014-09-13 – 2014-09-14 (×3): 3.375 g via INTRAVENOUS
  Filled 2014-09-13 (×4): qty 50

## 2014-09-13 MED ORDER — SUGAMMADEX SODIUM 500 MG/5ML IV SOLN
INTRAVENOUS | Status: AC
  Filled 2014-09-13: qty 5

## 2014-09-13 MED ORDER — HYDROMORPHONE HCL 1 MG/ML IJ SOLN
0.5000 mg | INTRAMUSCULAR | Status: DC | PRN
  Administered 2014-09-13: 1 mg via INTRAVENOUS
  Filled 2014-09-13: qty 1

## 2014-09-13 MED ORDER — FERROUS SULFATE 325 (65 FE) MG PO TABS
325.0000 mg | ORAL_TABLET | Freq: Three times a day (TID) | ORAL | Status: DC
  Administered 2014-09-13 – 2014-09-14 (×3): 325 mg via ORAL
  Filled 2014-09-13 (×4): qty 1

## 2014-09-13 MED ORDER — FENTANYL CITRATE (PF) 100 MCG/2ML IJ SOLN
INTRAMUSCULAR | Status: DC | PRN
  Administered 2014-09-13: 50 ug via INTRAVENOUS
  Administered 2014-09-13: 100 ug via INTRAVENOUS

## 2014-09-13 MED ORDER — SENNOSIDES-DOCUSATE SODIUM 8.6-50 MG PO TABS: 1.0000 | ORAL_TABLET | Freq: Two times a day (BID) | ORAL | Status: DC

## 2014-09-13 MED ORDER — SUGAMMADEX SODIUM 500 MG/5ML IV SOLN
INTRAVENOUS | Status: DC | PRN
  Administered 2014-09-13: 200 mg via INTRAVENOUS

## 2014-09-13 MED ORDER — PROMETHAZINE HCL 25 MG/ML IJ SOLN: 6.2500 mg | INTRAMUSCULAR | Status: DC | PRN

## 2014-09-13 MED ORDER — METOPROLOL TARTRATE 1 MG/ML IV SOLN
INTRAVENOUS | Status: AC
  Filled 2014-09-13: qty 5

## 2014-09-13 MED ORDER — OXYCODONE-ACETAMINOPHEN 5-325 MG PO TABS: 1.0000 | ORAL_TABLET | ORAL | Status: DC | PRN

## 2014-09-13 MED ORDER — METOPROLOL TARTRATE 1 MG/ML IV SOLN
INTRAVENOUS | Status: DC | PRN
  Administered 2014-09-13: 2 mg via INTRAVENOUS

## 2014-09-13 MED ORDER — PHENYLEPHRINE 40 MCG/ML (10ML) SYRINGE FOR IV PUSH (FOR BLOOD PRESSURE SUPPORT)
PREFILLED_SYRINGE | INTRAVENOUS | Status: AC
  Filled 2014-09-13: qty 10

## 2014-09-13 MED ORDER — PROPOFOL 10 MG/ML IV BOLUS
INTRAVENOUS | Status: AC
  Filled 2014-09-13: qty 20

## 2014-09-13 MED ORDER — ONDANSETRON HCL 4 MG/2ML IJ SOLN
INTRAMUSCULAR | Status: AC
  Filled 2014-09-13: qty 2

## 2014-09-13 MED ORDER — ONDANSETRON HCL 4 MG/2ML IJ SOLN
INTRAMUSCULAR | Status: DC | PRN
  Administered 2014-09-13: 4 mg via INTRAVENOUS

## 2014-09-13 MED ORDER — PHENYLEPHRINE HCL 10 MG/ML IJ SOLN
INTRAMUSCULAR | Status: DC | PRN
  Administered 2014-09-13: 40 ug via INTRAVENOUS
  Administered 2014-09-13 (×2): 80 ug via INTRAVENOUS
  Administered 2014-09-13 (×3): 40 ug via INTRAVENOUS
  Administered 2014-09-13 (×2): 80 ug via INTRAVENOUS

## 2014-09-13 MED ORDER — PROPOFOL 10 MG/ML IV BOLUS
INTRAVENOUS | Status: DC | PRN
Start: 2014-09-13 — End: 2014-09-13
  Administered 2014-09-13: 180 mg via INTRAVENOUS

## 2014-09-13 MED ORDER — SODIUM CHLORIDE 0.9 % IV SOLN
INTRAVENOUS | Status: DC
  Administered 2014-09-13: 13:00:00 via INTRAVENOUS

## 2014-09-13 MED ORDER — INTERFERON BETA-1B 0.3 MG ~~LOC~~ KIT: 0.3000 mg | PACK | SUBCUTANEOUS | Status: DC

## 2014-09-13 MED ORDER — FENTANYL CITRATE (PF) 250 MCG/5ML IJ SOLN
INTRAMUSCULAR | Status: AC
  Filled 2014-09-13: qty 25

## 2014-09-13 MED ORDER — 0.9 % SODIUM CHLORIDE (POUR BTL) OPTIME
TOPICAL | Status: DC | PRN
  Administered 2014-09-13: 1000 mL

## 2014-09-13 MED ORDER — SIMVASTATIN 5 MG PO TABS
5.0000 mg | ORAL_TABLET | Freq: Every day | ORAL | Status: DC
  Administered 2014-09-13: 5 mg via ORAL
  Filled 2014-09-13: qty 1

## 2014-09-13 MED ORDER — LACTATED RINGERS IV SOLN: INTRAVENOUS | Status: DC

## 2014-09-13 MED ORDER — OXYCODONE-ACETAMINOPHEN 5-325 MG PO TABS
1.0000 | ORAL_TABLET | ORAL | Status: DC | PRN
  Administered 2014-09-13 – 2014-09-14 (×2): 1 via ORAL
  Filled 2014-09-13 (×2): qty 1

## 2014-09-13 MED ORDER — SENNOSIDES-DOCUSATE SODIUM 8.6-50 MG PO TABS
2.0000 | ORAL_TABLET | Freq: Every day | ORAL | Status: DC
  Administered 2014-09-13: 2 via ORAL
  Filled 2014-09-13 (×2): qty 2

## 2014-09-13 MED ORDER — ONDANSETRON HCL 4 MG/2ML IJ SOLN
4.0000 mg | INTRAMUSCULAR | Status: DC | PRN
  Administered 2014-09-13: 4 mg via INTRAVENOUS
  Filled 2014-09-13: qty 2

## 2014-09-13 MED ORDER — FENTANYL CITRATE (PF) 100 MCG/2ML IJ SOLN: 25.0000 ug | INTRAMUSCULAR | Status: DC | PRN

## 2014-09-13 MED ORDER — BACLOFEN 10 MG PO TABS
10.0000 mg | ORAL_TABLET | Freq: Two times a day (BID) | ORAL | Status: DC
  Administered 2014-09-13 – 2014-09-14 (×3): 10 mg via ORAL
  Filled 2014-09-13 (×3): qty 1

## 2014-09-13 MED ORDER — BENZTROPINE MESYLATE 1 MG PO TABS
1.0000 mg | ORAL_TABLET | Freq: Two times a day (BID) | ORAL | Status: DC
  Administered 2014-09-13 – 2014-09-14 (×3): 1 mg via ORAL
  Filled 2014-09-13 (×4): qty 1

## 2014-09-13 MED ORDER — SUGAMMADEX SODIUM 200 MG/2ML IV SOLN
INTRAVENOUS | Status: AC
  Filled 2014-09-13: qty 2

## 2014-09-13 MED ORDER — ROCURONIUM BROMIDE 100 MG/10ML IV SOLN
INTRAVENOUS | Status: DC | PRN
  Administered 2014-09-13: 50 mg via INTRAVENOUS

## 2014-09-13 MED ORDER — PIPERACILLIN-TAZOBACTAM 3.375 G IVPB 30 MIN
3.3750 g | Freq: Once | INTRAVENOUS | Status: AC
  Administered 2014-09-13: 3.375 g via INTRAVENOUS
  Filled 2014-09-13: qty 50

## 2014-09-13 SURGICAL SUPPLY — 57 items
BAG URINE DRAINAGE (UROLOGICAL SUPPLIES) ×6 IMPLANT
BASKET ZERO TIP NITINOL 2.4FR (BASKET) ×3 IMPLANT
BENZOIN TINCTURE PRP APPL 2/3 (GAUZE/BANDAGES/DRESSINGS) ×6 IMPLANT
BLADE SURG 15 STRL LF DISP TIS (BLADE) ×1 IMPLANT
BLADE SURG 15 STRL SS (BLADE) ×2
CATH FOLEY 2W COUNCIL 20FR 5CC (CATHETERS) IMPLANT
CATH FOLEY 2WAY SLVR  5CC 16FR (CATHETERS) ×2
CATH FOLEY 2WAY SLVR 5CC 16FR (CATHETERS) ×1 IMPLANT
CATH IMAGER II 65CM (CATHETERS) ×6 IMPLANT
CATH INTERMIT  6FR 70CM (CATHETERS) ×3 IMPLANT
CATH ROBINSON RED A/P 20FR (CATHETERS) IMPLANT
CATH X-FORCE N30 NEPHROSTOMY (TUBING) ×3 IMPLANT
CHLORAPREP W/TINT 26ML (MISCELLANEOUS) ×6 IMPLANT
COVER SURGICAL LIGHT HANDLE (MISCELLANEOUS) ×3 IMPLANT
DRAPE C-ARM 42X120 X-RAY (DRAPES) ×3 IMPLANT
DRAPE LINGEMAN PERC (DRAPES) ×3 IMPLANT
DRAPE SHEET LG 3/4 BI-LAMINATE (DRAPES) ×3 IMPLANT
DRAPE SURG IRRIG POUCH 19X23 (DRAPES) ×3 IMPLANT
DRSG PAD ABDOMINAL 8X10 ST (GAUZE/BANDAGES/DRESSINGS) ×6 IMPLANT
DRSG TEGADERM 8X12 (GAUZE/BANDAGES/DRESSINGS) ×3 IMPLANT
FIBER LASER FLEXIVA 1000 (UROLOGICAL SUPPLIES) IMPLANT
FIBER LASER FLEXIVA 200 (UROLOGICAL SUPPLIES) IMPLANT
FIBER LASER FLEXIVA 365 (UROLOGICAL SUPPLIES) IMPLANT
FIBER LASER FLEXIVA 550 (UROLOGICAL SUPPLIES) IMPLANT
FIBER LASER TRAC TIP (UROLOGICAL SUPPLIES) IMPLANT
GAUZE SPONGE 4X4 12PLY STRL (GAUZE/BANDAGES/DRESSINGS) ×3 IMPLANT
GLOVE BIOGEL M STRL SZ7.5 (GLOVE) ×9 IMPLANT
GOWN STRL REUS W/TWL LRG LVL3 (GOWN DISPOSABLE) ×6 IMPLANT
GUIDEWIRE AMPLAZ .035X145 (WIRE) ×6 IMPLANT
GUIDEWIRE ANG ZIPWIRE 038X150 (WIRE) ×6 IMPLANT
GUIDEWIRE STR DUAL SENSOR (WIRE) IMPLANT
KIT BASIN OR (CUSTOM PROCEDURE TRAY) ×3 IMPLANT
MANIFOLD NEPTUNE II (INSTRUMENTS) ×3 IMPLANT
NEEDLE TROCAR 18X15 ECHO (NEEDLE) IMPLANT
NEEDLE TROCAR 18X20 (NEEDLE) IMPLANT
NS IRRIG 1000ML POUR BTL (IV SOLUTION) ×3 IMPLANT
PACK BASIC VI WITH GOWN DISP (CUSTOM PROCEDURE TRAY) ×3 IMPLANT
PACK CYSTO (CUSTOM PROCEDURE TRAY) ×3 IMPLANT
PROBE LITHOCLAST ULTRA 3.8X403 (UROLOGICAL SUPPLIES) ×3 IMPLANT
PROBE PNEUMATIC 1.0MMX570MM (UROLOGICAL SUPPLIES) ×3 IMPLANT
SET IRRIG Y TYPE TUR BLADDER L (SET/KITS/TRAYS/PACK) ×3 IMPLANT
SET WARMING FLUID IRRIGATION (MISCELLANEOUS) IMPLANT
SHEATH ACCESS URETERAL 24CM (SHEATH) ×3 IMPLANT
SHEATH PEELAWAY SET 9 (SHEATH) ×3 IMPLANT
SPONGE LAP 4X18 X RAY DECT (DISPOSABLE) ×3 IMPLANT
STONE CATCHER W/TUBE ADAPTER (UROLOGICAL SUPPLIES) ×3 IMPLANT
SURGIFLO W/THROMBIN 8M KIT (HEMOSTASIS) ×3 IMPLANT
SUT SILK 2 0 30  PSL (SUTURE) ×2
SUT SILK 2 0 30 PSL (SUTURE) ×1 IMPLANT
SUT VIC AB 2-0 SH 27 (SUTURE) ×2
SUT VIC AB 2-0 SH 27X BRD (SUTURE) ×1 IMPLANT
SYR 20CC LL (SYRINGE) ×6 IMPLANT
SYRINGE 10CC LL (SYRINGE) ×3 IMPLANT
TOWEL OR 17X26 10 PK STRL BLUE (TOWEL DISPOSABLE) ×3 IMPLANT
TUBING CONNECTING 10 (TUBING) ×6 IMPLANT
TUBING CONNECTING 10' (TUBING) ×3
WATER STERILE IRR 1500ML POUR (IV SOLUTION) ×3 IMPLANT

## 2014-09-13 NOTE — Brief Op Note (Signed)
09/13/2014  11:46 AM  PATIENT:  Jacqueline Norman  44 y.o. female  PRE-OPERATIVE DIAGNOSIS:  LEFT RENAL AND URETERAL STONES, QUADRIPLEGIA  POST-OPERATIVE DIAGNOSIS:  LEFT RENAL AND URETERAL STONES, QUADRIPLEGIA  PROCEDURE:  Procedure(s): NEPHROLITHOTOMY PERCUTANEOUS (Left) HOLMIUM LASER APPLICATION (Left)  SURGEON:  Surgeon(s) and Role:    * Sebastian Ache, MD - Primary  PHYSICIAN ASSISTANT:   ASSISTANTS: none   ANESTHESIA:   general  EBL:  Total I/O In: -  Out: 200 [Blood:200]  BLOOD ADMINISTERED:none  DRAINS: 1 - foley to gravity   LOCAL MEDICATIONS USED:  NONE  SPECIMEN:  Source of Specimen:  left renal and ureteral stone fragments  DISPOSITION OF SPECIMEN:  [facility] for compositional analysis  COUNTS:  YES  TOURNIQUET:  * No tourniquets in log *  DICTATION: .Other Dictation: Dictation Number [id number]  PLAN OF CARE: Admit to inpatient   PATIENT DISPOSITION:  PACU - hemodynamically stable.   Delay start of Pharmacological VTE agent (>24hrs) due to surgical blood loss or risk of bleeding: yes

## 2014-09-13 NOTE — H&P (Signed)
Jacqueline Norman is an 44 y.o. female.    Chief Complaint: Pre-op Left Percutaneous Nephrostolithotomy  HPI:   1 - Acute Renal Failure / Obstruction of Solitary Left Kidney - CR 3.98 on admit 6/26 up from baseline <1 with hdro of solitary kidney from large volume stone. Now s/p neph tube 6/27. 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 Most recent Cr 1.05 after renal decompression.  2 - Chronic Bacteruria- many episodeds cystitis, several febrile UTI's. Most recent UCX76/2016 MDR e. coli sens3rd gen cephs, zosyn.  3 - Large Left Renal Stone - multifocal left renal stone, total volume about 2.5 cm by CT 07/2014. This is new since last imaging 2015 at time of right nephrectomy.   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   Today Jacqueline Norman is seen to proceed with left percutnaous nephrostolithotomy for management of her large left renal stone in soligary kidney. She has been on PO cefpodoxime pre-op according to most recent cultures.   Past Medical History  Diagnosis Date  . MS (multiple sclerosis)     BED BOUND -UP IN W/C USING HOYER LIFT- UNABLE TO MOVE ARMS AND LEGS - NOT ABLE TO FEED SELF- NO TROUBLE SWALLOWING - STATES SHE EATS WELL; INCONTINENT - WEARS DEPENDS  . Perinephric abscess     HOSPITALIZED 07/29/13 TO 08/05/13.WITH SEPSIS  . Hypothyroidism   . Anemia   . Xanthogranulomatous pyelonephritis     RIGHT     Past Surgical History  Procedure Laterality Date  . No past surgeries    . Robot assisted laparoscopic nephrectomy Right 10/05/2013    Procedure: ROBOTIC ASSISTED LAPAROSCOPIC RADICAL NEPHRECTOMY;  Surgeon: Sebastian Ache, MD;  Location: [facility];  Service: Urology;  Laterality: Right;    History reviewed. No pertinent family history. Social History:  reports that she quit smoking about 17 years ago. She has never used smokeless tobacco. She reports  that she does not drink alcohol or use illicit drugs.  Allergies: No Known Allergies  No prescriptions prior to admission    No results found for this or any previous visit (from the past 48 hour(s)). No results found.  Review of Systems  Constitutional: Negative.  Negative for fever and chills.  HENT: Negative.   Eyes: Negative.   Respiratory: Negative.   Cardiovascular: Negative.   Gastrointestinal: Negative.   Genitourinary: Negative.   Musculoskeletal: Negative.   Skin: Negative.   Neurological: Negative.   Endo/Heme/Allergies: Negative.   Psychiatric/Behavioral: Negative.     Last menstrual period 08/05/2014. Physical Exam  Constitutional:  Stigmata of chronic dissease, bedbound. Cooperative and at baseline.   HENT:  Head: Normocephalic.  Eyes: Pupils are equal, round, and reactive to light.  Neck: Normal range of motion.  Cardiovascular: Normal rate.   Respiratory: Effort normal.  GI: Soft.  signiricant truncal obesity  Genitourinary:  Left neph tube c/d/i with yellow urine  Musculoskeletal: Normal range of motion.  Neurological: She is alert.  Skin: Skin is warm.  Psychiatric: She has a normal mood and affect. Her behavior is normal. Judgment and thought content normal.     Assessment/Plan  1 - Acute Renal Failure / Obstruction of Solitary Left Kidney - now temporized with nephrostomy and GFR acceptable. Will need removal of left sided stone.   2 - Chronic Bacteruria- has been on CX-specific antimicrobials, she remains at significant infectious risk and this was reiterated to pt.   3 -  Large Left Renal Stone - rec PCNL in elective setting through existing tract, hopefully single-stage.  Would benefit from metabolic eval if possible shen stone free.  We rediscussed percutaneous nephrostolithotomy (PCNL) in detail including need for percutaneous access which is sometimes gained by the surgeon, and other times by radiology or through existing nephrostomy tubes  if present. We specificallyre discussed that often times tubes remain in after surgery until we are confident all stone has been treated. We rementioned that staged surgery is needed in over 50% of cases of very large or complex stone. We then rediscussed general risks including bleeding, infection, damage to kidney / ureter / bladder, loss of kidney, as well as anesthetic risks and rare but serious surgical complications including DVT, PE, MI, and mortality.  Pt voiced understanding and desire to proceed today as planned.   ,  09/13/2014, 6:22 AM

## 2014-09-13 NOTE — Anesthesia Postprocedure Evaluation (Signed)
  Anesthesia Post-op Note  Patient: Jacqueline Norman  Procedure(s) Performed: Procedure(s) (LRB): NEPHROLITHOTOMY PERCUTANEOUS (Left) HOLMIUM LASER APPLICATION (Left)  Patient Location: PACU  Anesthesia Type: General  Level of Consciousness: awake and alert   Airway and Oxygen Therapy: Patient Spontanous Breathing  Post-op Pain: mild  Post-op Assessment: Post-op Vital signs reviewed, Patient's Cardiovascular Status Stable, Respiratory Function Stable, Patent Airway and No signs of Nausea or vomiting  Last Vitals:  Filed Vitals:   09/13/14 1308  BP: 137/95  Pulse: 84  Temp: 36.6 C  Resp: 16    Post-op Vital Signs: stable   Complications: No apparent anesthesia complications

## 2014-09-13 NOTE — Transfer of Care (Signed)
Immediate Anesthesia Transfer of Care Note  Patient: Jacqueline Norman  Procedure(s) Performed: Procedure(s): NEPHROLITHOTOMY PERCUTANEOUS (Left) HOLMIUM LASER APPLICATION (Left)  Patient Location: PACU  Anesthesia Type:General  Level of Consciousness: Patient easily awoken, sedated, comfortable, cooperative, following commands, responds to stimulation.   Airway & Oxygen Therapy: Patient spontaneously breathing, ventilating well, oxygen via simple oxygen mask.  Post-op Assessment: Report given to PACU RN, vital signs reviewed and stable.   Post vital signs: Reviewed and stable.  Complications: No apparent anesthesia complications

## 2014-09-13 NOTE — Anesthesia Preprocedure Evaluation (Signed)
Anesthesia Evaluation  Patient identified by MRN, date of birth, ID band Patient awake    Reviewed: Allergy & Precautions, H&P , NPO status , Patient's Chart, lab work & pertinent test results  Airway Mallampati: II  TM Distance: >3 FB Neck ROM: Full    Dental no notable dental hx.    Pulmonary neg pulmonary ROS, former smoker,  breath sounds clear to auscultation  Pulmonary exam normal       Cardiovascular negative cardio ROS Normal cardiovascular examRhythm:Regular Rate:Normal     Neuro/Psych Severe multiple sclerosis. Tetraparetic.  Neuromuscular disease negative psych ROS   GI/Hepatic negative GI ROS, Neg liver ROS,   Endo/Other  Hypothyroidism Morbid obesity  Renal/GU Renal disease     Musculoskeletal negative musculoskeletal ROS (+)   Abdominal (+) + obese,   Peds  Hematology  (+) anemia ,   Anesthesia Other Findings   Reproductive/Obstetrics negative OB ROS                             Anesthesia Physical  Anesthesia Plan  ASA: IV  Anesthesia Plan: General   Post-op Pain Management:    Induction: Intravenous  Airway Management Planned: Oral ETT  Additional Equipment:   Intra-op Plan:   Post-operative Plan: Extubation in OR and Possible Post-op intubation/ventilation  Informed Consent: I have reviewed the patients History and Physical, chart, labs and discussed the procedure including the risks, benefits and alternatives for the proposed anesthesia with the patient or authorized representative who has indicated his/her understanding and acceptance.   Dental advisory given  Plan Discussed with: CRNA  Anesthesia Plan Comments:         Anesthesia Quick Evaluation

## 2014-09-13 NOTE — Discharge Instructions (Signed)
1 - You may have urinary urgency (bladder spasms) and bloody urine on / off with stent in place. This is normal. ° °2 - Call MD or go to ER for fever >102, severe pain / nausea / vomiting not relieved by medications, or acute change in medical status ° °

## 2014-09-13 NOTE — Anesthesia Procedure Notes (Addendum)
Procedure Name: Intubation Date/Time: 09/13/2014 9:59 AM Performed by: Ludwig Lean Pre-anesthesia Checklist: Patient identified, Emergency Drugs available, Suction available and Patient being monitored Patient Re-evaluated:Patient Re-evaluated prior to inductionOxygen Delivery Method: Circle System Utilized Preoxygenation: Pre-oxygenation with 100% oxygen Intubation Type: IV induction Ventilation: Mask ventilation without difficulty Laryngoscope Size: Mac and 3 Grade View: Grade I Tube type: Oral Tube size: 7.5 mm Number of attempts: 1 Airway Equipment and Method: Oral airway Placement Confirmation: ETT inserted through vocal cords under direct vision,  positive ETCO2 and breath sounds checked- equal and bilateral Secured at: 20 cm Tube secured with: Tape Dental Injury: Teeth and Oropharynx as per pre-operative assessment

## 2014-09-14 LAB — HEMOGLOBIN AND HEMATOCRIT, BLOOD
HEMATOCRIT: 35.5 % — AB (ref 36.0–46.0)
HEMOGLOBIN: 11 g/dL — AB (ref 12.0–15.0)

## 2014-09-14 LAB — BASIC METABOLIC PANEL
Anion gap: 8 (ref 5–15)
BUN: 9 mg/dL (ref 6–20)
CALCIUM: 9.4 mg/dL (ref 8.9–10.3)
CO2: 25 mmol/L (ref 22–32)
CREATININE: 1.15 mg/dL — AB (ref 0.44–1.00)
Chloride: 104 mmol/L (ref 101–111)
GFR calc Af Amer: >60 mL/min (ref >60)
GFR calc non Af Amer: 57 mL/min — ABNORMAL LOW (ref >60)
GLUCOSE: 111 mg/dL — AB (ref 65–99)
Potassium: 3.9 mmol/L (ref 3.5–5.1)
SODIUM: 137 mmol/L (ref 135–145)

## 2014-09-14 MED ORDER — CEFPODOXIME PROXETIL 200 MG PO TABS
100.0000 mg | ORAL_TABLET | Freq: Two times a day (BID) | ORAL | Status: DC
  Administered 2014-09-14: 100 mg via ORAL
  Filled 2014-09-14: qty 1

## 2014-09-14 MED ORDER — SODIUM CHLORIDE 0.9 % IV BOLUS (SEPSIS)
500.0000 mL | Freq: Once | INTRAVENOUS | Status: AC
  Administered 2014-09-14: 500 mL via INTRAVENOUS

## 2014-09-14 NOTE — Progress Notes (Signed)
1 Day Post-Op Subjective: NAEON. Pain well controlled on PO meds (1 percocet overnight). Eating breakfast this morning, no N/V.  Objective: Vital signs in last 24 hours: Temp:  [97.4 F (36.3 C)-98.3 F (36.8 C)] 98.3 F (36.8 C) (08/06 0210) Pulse Rate:  [79-103] 96 (08/06 0210) Resp:  [12-16] 16 (08/06 0210) BP: (100-137)/(66-95) 100/66 mmHg (08/06 0210) SpO2:  [98 %-100 %] 100 % (08/06 0210) Weight:  [88.933 kg (196 lb 1 oz)] 88.933 kg (196 lb 1 oz) (08/05 0743)  Intake/Output from previous day: 08/05 0701 - 08/06 0700 In: 2837.5 [P.O.:240; I.V.:2547.5; IV Piggyback:50] Out: 2000 [Urine:1800; Blood:200] Intake/Output this shift: Total I/O In: 578.8 [I.V.:578.8] Out: 1700 [Urine:1700]  Physical Exam:  General: Alert and oriented CV: RRR Lungs: Clear Abdomen: Soft, ND Incisions: c/d/i Tubes: NUS capped, secure Ext: NT, No erythema  Lab Results:  Recent Labs  09/13/14 0810 09/13/14 1240  HGB 12.1 12.1  HCT 39.1 36.9   BMET  Recent Labs  09/13/14 0810 09/13/14 1240  NA 134* 136  K 3.9 4.4  CL 104 104  CO2 25 20*  GLUCOSE 100* 106*  BUN 9 8  CREATININE 1.11* 1.05*  CALCIUM 8.9 8.8*     Studies/Results: Dg Abd 1 View  09/13/2014   CLINICAL DATA:  Left percutaneous stent placement.  EXAM: ABDOMEN - 1 VIEW  COMPARISON:  One-view abdomen 08/06/2014 and CT of the abdomen and pelvis 08/05/2006.  FINDINGS: Five intraoperative images are submitted from a percutaneous nephrostomy. The final image demonstrates a hockey-stick catheter with the tip in place in the urinary bladder. Catheter courses through the left ureter and out the left flank.  IMPRESSION: Left percutaneous drainage of the urinary tract with the tip of the catheter in the urinary bladder via the left ureter.   Electronically Signed   By: Marin Roberts M.D.   On: 09/13/2014 12:06   Dg C-arm 61-120 Min-no Report  09/13/2014   CLINICAL DATA: surgery   C-ARM 61-120 MINUTES  Fluoroscopy was  utilized by the requesting physician.  No radiographic  interpretation.     Assessment/Plan:  44yF with history of solitary LEFT kidney, quadriplegia and MS who is status post LEFT PCNL and ureteroscopy. Nephroureteral stent in place and capped. Pain well controlled  -d/c foley -MLIV -regular diet -monitor and plan for possible discharge to home later this afternoon   LOS: 1 day   Loura Pardon 09/14/2014, 3:23 AM

## 2014-09-14 NOTE — Discharge Summary (Signed)
Date of admission: 09/13/2014  Date of discharge: 09/14/2014  Admission diagnosis: Left nephrolithiasis, Solitary Kidney, Chronic Bacteriuria  Discharge diagnosis: Same as above  Secondary diagnoses: Multiple scelrosis, hypothyroidism, anemia (all POA)  History and Physical: For full details, please see admission history and physical. Briefly, Jacqueline Norman is a 44 y.o. year old patient with acute renal failure and obstruction of a solitary LEFT kidney by 2.5 cm stone as well as chronic bacteriuria. She is status post LEFT PCNL with nephroureteral stent placement.  Hospital Course:   Left nephrolithiasis/obstruction of solitary kidney-status post LEFT PCNL 09/13/14 with complete resolution of all stone fragments and placement of externalized nephroureteral stent which was capped. She tolerated the procedure well and was transferred to the post-surgical floor. She did well and her diet was slowly advanced. On day of discharge pain was controlled on PO medicines, vitals were stable, she was started on oral Vantin which she will continue through follow-up. Foley catheter was removed the morning of POD 1 and she was able to void volitionally. Nephroureteral stent was in place and secure with no leakage around the tube.  Physical Exam: NAD NWOB on RA abdomen soft, NT, ND LEFT NUS taped and secure to back and capped Voiding Extremities WWP   Laboratory values:  Recent Labs  09/13/14 0810 09/13/14 1240 09/14/14 0455  HGB 12.1 12.1 11.0*  HCT 39.1 36.9 35.5*    Recent Labs  09/13/14 1240 09/14/14 0455  CREATININE 1.05* 1.15*    Disposition: Home  Discharge instruction: The patient was instructed to be ambulatory but told to refrain from heavy lifting, strenuous activity, or driving.   1. You may see some blood in the urine and may have some burning with urination for 48-72 hours. You also may notice that you have to urinate more frequently or urgently after your procedure which is  normal.  2. You should call should you develop an inability urinate, fever > 101, persistent nausea and vomiting that prevents you from eating or drinking to stay hydrated.  3. If you have a stent, you will likely urinate more frequently and urgently until the stent is removed and you may experience some discomfort/pain in the lower abdomen and flank especially when urinating. You may take pain medication prescribed to you if needed for pain. You may also intermittently have blood in the urine until the stent is removed. 4. If you have a catheter, you will be taught how to take care of the catheter by the nursing staff prior to discharge from the hospital.  You may periodically feel a strong urge to void with the catheter in place.  This is a bladder spasm and most often can occur when having a bowel movement or moving around. It is typically self-limited and usually will stop after a few minutes.  You may use some Vaseline or Neosporin around the tip of the catheter to reduce friction at the tip of the penis. You may also see some blood in the urine.  A very small amount of blood can make the urine look quite red.  As long as the catheter is draining well, there usually is not a problem.  However, if the catheter is not draining well and is bloody, you should call the office [phone]) to notify us.  5.  You have a tube in your LEFT flank that will remain capped. We will remove this at time of your  follow-up appointment  Discharge medications:    Medication List  STOP taking these medications        cephALEXin 500 MG capsule  Commonly known as:  KEFLEX     ciprofloxacin 500 MG tablet  Commonly known as:  CIPRO      TAKE these medications        baclofen 10 MG tablet  Commonly known as:  LIORESAL  Take 10 mg by mouth 2 (two) times daily.     benztropine 1 MG tablet  Commonly known as:  COGENTIN  Take 1 mg by mouth 2 (two) times daily.     cefpodoxime 100 MG tablet  Commonly  known as:  VANTIN  Take 1 tablet (100 mg total) by mouth 2 (two) times daily. X 3 days. Begin day prior to next Urology appointment     cholecalciferol 1000 UNITS tablet  Commonly known as:  VITAMIN D  Take 1,000 Units by mouth every morning.     docusate sodium 100 MG capsule  Commonly known as:  COLACE  Take 100 mg by mouth daily as needed for mild constipation.     ferrous sulfate 325 (65 FE) MG tablet  Take 1 tablet (325 mg total) by mouth 3 (three) times daily with meals.     Interferon Beta-1b 0.3 MG Kit injection  Commonly known as:  BETASERON/EXTAVIA  Inject 0.3 mg into the skin every other day.     levothyroxine 50 MCG tablet  Commonly known as:  SYNTHROID, LEVOTHROID  Take 50 mcg by mouth daily before breakfast.     omega-3 acid ethyl esters 1 G capsule  Commonly known as:  LOVAZA  Take 1 g by mouth 2 (two) times daily.     oxyCODONE-acetaminophen 5-325 MG per tablet  Commonly known as:  ROXICET  Take 1-2 tablets by mouth every 4 (four) hours as needed for moderate pain or severe pain. Post-operatively     potassium chloride SA 20 MEQ tablet  Commonly known as:  K-DUR,KLOR-CON  Take 20 mEq by mouth every morning.     senna-docusate 8.6-50 MG per tablet  Commonly known as:  Senokot-S  Take 1 tablet by mouth 2 (two) times daily. While taking pain meds to prevent constipation     simvastatin 5 MG tablet  Commonly known as:  ZOCOR  Take 5 mg by mouth at bedtime.        Followup:      Follow-up Information    Follow up with MANNY, THEODORE, MD On 09/26/2014.   Specialty:  Urology   Why:  for MD visit and final kidney tube removal   Contact information:   [address] [place] [address] [phone]        

## 2014-09-14 NOTE — Clinical Social Work Note (Signed)
CSW received a call for pt ambulance home  CSW met with pt to confirm address, provided packet to RN and called for transport  No further CSW needs  .Dede Query, LCSW [facility] Clinical Social Worker - Weekend Coverage cell #: [phone]

## 2014-09-14 NOTE — Op Note (Signed)
[name], OKERLUND NO.:  [id number]  MEDICAL RECORD NO.:  [id number]  LOCATION:  1406                         FACILITY:  [facility]  PHYSICIAN:  Sebastian Ache, MD     DATE OF BIRTH:  [birth year]  DATE OF PROCEDURE: 09/13/2014                               OPERATIVE REPORT   DIAGNOSIS:  Large volume left nephrolithiasis, partial staghorn, and solitary kidney.  PROCEDURES: 1. Percutaneous nephrostolithotomy, greater than 2 cm. 2. Left ureteroscopy with basketing of stones. 3. Left antegrade nephrostogram with interpretation. 4. Placement of left ureteral stent.  ESTIMATED BLOOD LOSS:  250 mL.  COMPLICATIONS:  None.  SPECIMENS: 1. Left renal and ureteral stone fragments for compositional analysis. 2. Large volume very soft partial staghorn renal pelvis stone, likely     infectious in etiology. 3. Multifocal small left ureteral stone fragments, distal most     obstructing, all these were completely cleared. 4. Successful placement of left nephroureteral stent proximal and     externalized, distal in urinary bladder. 5. Complete resolution of all stone fragments, larger than 1 mm by     endoscopy following percutaneous nephrostolithotomy and basketing     stones.  INDICATIONS:  Jacqueline Norman is an unfortunate 44 year old lady with history of solitary kidney, as her right kidney was lost to xanthogranulomatous pyelonephritis, status post nephrectomy last year.  She was lost to follow up in the interval.  She re-presented with acute renal failure, urosepsis, large volume new stones in her solitary kidney.  She was managed with intravenous antibiotics and nephrostomy tube.  She has since cleared her infectious parameters and now presents for definitive management of her left-sided stone disease via percutaneous approach given her large volume.  Informed consent was obtained and placed in the medical record.  DESCRIPTION OF PROCEDURE:  The patient being Jacqueline Norman verified. Procedure being left percutaneous nephrostolithotomy was confirmed. Procedure was carried out.  Time-out was performed.  Intravenous antibiotics administered.  General endotracheal anesthesia was introduced.  The patient was placed into a prone position, employing a prone view, sequential compression devices, chest rolls, padding of her knees and ankles.  Her arms were placed in a position at her side given her end stage and that she cannot tolerate her arms above her head.  All bony prominences were carefully padded.  Sterile field was created by prepping and draping the patient's entire right flank including her in situ nephrostomy tube using chlorhexidine gluconate.  Percutaneous drape was applied.  Foley catheter had been placed per ureter to straight drain prior to prone positioning.  Antegrade nephrostogram was then obtained.  Left antegrade nephrostogram revealed large volume filling defect in the left kidney, consistent with known partial staghorn kidney stone. Contrast had freely flowed into the level of the ureter proximally.  The nephrostomy tube was removed over a 0.038 zip wire which was then carefully navigated down the ureter and exchanged for a Superstiff wire via the Imager catheter.  Next, dual axial lumen introducer was placed after incising the skin 2 cm above the flank and navigated to the proximal ureter under continuous fluoroscopic vision.  A second Superstiff wire was then placed by first advancing a  zip wire and exchanging via the Imager catheter allowing dual Superstiff wire access obtained from the percutaneous tract to the kidney.  The NephroMax balloon dilation apparatus was carefully advanced across the lower pole calyx tract, inflated to pressure of 20 atmospheres, held for 90 seconds and the sheath advanced across this tract.  Rigid nephroscopy was then performed.  This revealed excellent sheath placement and there was visualization of  large volume renal pelvis, partial staghorn stone.  It appeared to be much too large for simple basketing.  As such, LithoClast dual-ultrasound pneumatic energy was applied to the stone for approximately 35 minutes, completely ablating all visible stone fragments larger than 1 mm that were accessible with rigid technique.  Next, flexible nephroscopy was performed which allowed inspection of each calix x2.  There are several small additional stones that were grasped with ZeroTip basket and removed.  The UPJ was inspected and found to be visibly patent.  A separate Sensor working wire was advanced down the level of the ureter, over which a flexible digital ureteroscope was carefully passed to the level of the mid ureter and flexible ureteroscopy was unremarkable of the proximal ureter.  In the distal fourth, there were several stone fragments which appeared to be possibly occlusive.  These were controlled using the ZeroTip basket to aid in ureteroscopy.  An antegrade sheath was placed over one of the Superstiff wires and sequential basketing was performed.  Several distal stone fragments in the distal ureter such that we completely cleared all stones to the level of the urinary bladder which was easily navigated in antegrade fashion.  The access sheath was removed under continuous ureteroscopic vision.  Hemostasis appeared excellent.  There was complete resolution of all stone fragments larger than 1 mm by flexible nephroscopy and ureteroscopy.  A new Imager type catheter was advanced over the remaining safety wire to the level of bladder.  Safety wire was removed and the Imager catheter was sewn in place, acting as an externalized nephroureteral stent and it was capped.  The sheath was removed.  Percutaneous tract was closed using a 10 mL of FloSeal followed by interrupted Vicryl to the level of skin and the percutaneous  dressing was applied.  Procedure was then terminated.  The patient  tolerated the procedure well.  There were no immediate periprocedural complications.  The patient was taken to the postanesthesia care unit in stable condition.          ______________________________ Sebastian Ache, MD     TM/MEDQ  D:  09/13/2014  T:  09/14/2014  Job:  [id number]

## 2014-09-14 NOTE — Care Management Note (Signed)
Case Management Note  Patient Details  Name: ROSINE SOLECKI MRN: [id number] Date of Birth: August 18, 1970  Subjective/Objective:      Left nephrolithiasis, Solitary Kidney, Chronic Bacteriuria              Action/Plan: Home Health  Expected Discharge Date:  09/14/2014              Expected Discharge Plan:  Home w Home Health Services  In-House Referral:     Discharge planning Services  CM Consult  Post Acute Care Choice:  Home Health, Resumption of Svcs/PTA Provider Choice offered to:  Parent    HH Arranged:  RN [facility] Agency:  [facility]  Status of Service:  Completed, signed off  Medicare Important Message Given:    Date Medicare IM Given:    Medicare IM give by:    Date Additional Medicare IM Given:    Additional Medicare Important Message give by:     If discussed at Long Length of Stay Meetings, dates discussed:    Additional Comments: Pt is active with [facility] RN. Contacted [facility] for resumption of care.  Elliot Cousin, RN 09/14/2014, 2:29 PM

## 2014-09-16 ENCOUNTER — Encounter (HOSPITAL_COMMUNITY): Payer: Self-pay | Admitting: Urology

## 2014-09-19 ENCOUNTER — Other Ambulatory Visit: Payer: Self-pay | Admitting: Urology

## 2014-09-19 ENCOUNTER — Ambulatory Visit (HOSPITAL_COMMUNITY)
Admission: RE | Admit: 2014-09-19 | Discharge: 2014-09-19 | Disposition: A | Discharge status: Home / Self Care | Payer: Medicare Other | Source: Ambulatory Visit | Attending: Urology | Admitting: Urology

## 2014-09-19 DIAGNOSIS — Z87442 Personal history of urinary calculi: Secondary | ICD-10-CM | POA: Insufficient documentation

## 2014-09-19 DIAGNOSIS — Z436 Encounter for attention to other artificial openings of urinary tract: Secondary | ICD-10-CM | POA: Insufficient documentation

## 2014-09-19 DIAGNOSIS — N200 Calculus of kidney: Secondary | ICD-10-CM

## 2014-09-19 MED ORDER — LIDOCAINE HCL 1 % IJ SOLN
INTRAMUSCULAR | Status: AC
  Filled 2014-09-19: qty 20

## 2014-09-19 MED ORDER — IOHEXOL 300 MG/ML  SOLN
20.0000 mL | Freq: Once | INTRAMUSCULAR | Status: DC | PRN
  Administered 2014-09-19: 20 mL via INTRAVENOUS
  Filled 2014-09-19: qty 20

## 2014-09-19 NOTE — Procedures (Signed)
Interventional Radiology Procedure Note  Procedure:  Left nephrostogram with nephrostomy tube change.  Complications:  None  Estimated Blood Loss:  None  Left ureter open to bladder.  No hydro.  5 fr catheter removed and 10 Fr nephrostomy tube placed.  Attached to gravity bag drainage.    Jodi Marble. Fredia Sorrow, M.D Pager:  [phone]

## 2015-07-07 ENCOUNTER — Emergency Department (HOSPITAL_COMMUNITY): Payer: Medicare Other

## 2015-07-07 ENCOUNTER — Encounter (HOSPITAL_COMMUNITY): Payer: Self-pay | Admitting: Emergency Medicine

## 2015-07-07 ENCOUNTER — Emergency Department (HOSPITAL_COMMUNITY)
Admission: EM | Admit: 2015-07-07 | Discharge: 2015-07-07 | Disposition: A | Discharge status: Home / Self Care | Payer: Medicare Other | Attending: Emergency Medicine | Admitting: Emergency Medicine

## 2015-07-07 DIAGNOSIS — N390 Urinary tract infection, site not specified: Secondary | ICD-10-CM | POA: Insufficient documentation

## 2015-07-07 DIAGNOSIS — Z87891 Personal history of nicotine dependence: Secondary | ICD-10-CM | POA: Diagnosis not present

## 2015-07-07 DIAGNOSIS — Z79899 Other long term (current) drug therapy: Secondary | ICD-10-CM | POA: Insufficient documentation

## 2015-07-07 DIAGNOSIS — E039 Hypothyroidism, unspecified: Secondary | ICD-10-CM | POA: Insufficient documentation

## 2015-07-07 DIAGNOSIS — R509 Fever, unspecified: Secondary | ICD-10-CM | POA: Diagnosis present

## 2015-07-07 LAB — BASIC METABOLIC PANEL
ANION GAP: 11 (ref 5–15)
BUN: 11 mg/dL (ref 6–20)
CALCIUM: 9.5 mg/dL (ref 8.9–10.3)
CO2: 23 mmol/L (ref 22–32)
CREATININE: 1.03 mg/dL — AB (ref 0.44–1.00)
Chloride: 103 mmol/L (ref 101–111)
GFR calc Af Amer: >60 mL/min (ref >60)
GLUCOSE: 187 mg/dL — AB (ref 65–99)
Potassium: 4 mmol/L (ref 3.5–5.1)
Sodium: 137 mmol/L (ref 135–145)

## 2015-07-07 LAB — URINALYSIS, ROUTINE W REFLEX MICROSCOPIC
BILIRUBIN URINE: NEGATIVE
Glucose, UA: NEGATIVE mg/dL
HGB URINE DIPSTICK: NEGATIVE
KETONES UR: NEGATIVE mg/dL
Leukocytes, UA: NEGATIVE
Nitrite: POSITIVE — AB
PROTEIN: NEGATIVE mg/dL
Specific Gravity, Urine: 1.024 (ref 1.005–1.030)
pH: 5.5 (ref 5.0–8.0)

## 2015-07-07 LAB — CBC WITH DIFFERENTIAL/PLATELET
Basophils Absolute: 0 10*3/uL (ref 0.0–0.1)
Basophils Relative: 0 %
EOS PCT: 1 %
Eosinophils Absolute: 0 10*3/uL (ref 0.0–0.7)
HCT: 39.6 % (ref 36.0–46.0)
Hemoglobin: 13.2 g/dL (ref 12.0–15.0)
LYMPHS PCT: 11 %
Lymphs Abs: 0.7 10*3/uL (ref 0.7–4.0)
MCH: 29.1 pg (ref 26.0–34.0)
MCHC: 33.3 g/dL (ref 30.0–36.0)
MCV: 87.4 fL (ref 78.0–100.0)
MONO ABS: 0.5 10*3/uL (ref 0.1–1.0)
MONOS PCT: 8 %
Neutro Abs: 5.2 10*3/uL (ref 1.7–7.7)
Neutrophils Relative %: 80 %
PLATELETS: 223 10*3/uL (ref 150–400)
RBC: 4.53 MIL/uL (ref 3.87–5.11)
RDW: 17.2 % — AB (ref 11.5–15.5)
WBC: 6.5 10*3/uL (ref 4.0–10.5)

## 2015-07-07 LAB — URINE MICROSCOPIC-ADD ON: RBC / HPF: NONE SEEN RBC/hpf (ref 0–5)

## 2015-07-07 MED ORDER — SODIUM CHLORIDE 0.9 % IV BOLUS (SEPSIS)
1000.0000 mL | Freq: Once | INTRAVENOUS | Status: AC
  Administered 2015-07-07: 1000 mL via INTRAVENOUS

## 2015-07-07 MED ORDER — CIPROFLOXACIN HCL 500 MG PO TABS: 500.0000 mg | ORAL_TABLET | Freq: Two times a day (BID) | ORAL | Status: DC

## 2015-07-07 NOTE — Discharge Instructions (Signed)

## 2015-07-07 NOTE — ED Notes (Addendum)
Per EMS: pt from home, has had fever and chills since last night, at home 102. Took tylenol at home

## 2015-07-07 NOTE — ED Provider Notes (Signed)
CSN: [id number]     Arrival date & time 07/07/15  1004 History   First MD Initiated Contact with Patient 07/07/15 1042     Chief Complaint  Patient presents with  . Fever  . Chills     (Consider location/radiation/quality/duration/timing/severity/associated sxs/prior Treatment) Patient is a 45 y.o. female presenting with general illness. The history is provided by the patient.  Illness Severity:  Moderate Onset quality:  Sudden Timing:  Constant Progression:  Worsening Chronicity:  Recurrent Associated symptoms: fever   Associated symptoms: no chest pain, no congestion, no headaches, no myalgias, no nausea, no rhinorrhea, no shortness of breath, no vomiting and no wheezing     45 yo F With a chief complaints of fevers and chills. Patient is bedbound secondary to MS. Last time she had an episode like this is due to a urinary tract infection. Denies any cough or congestion. Denies any abdominal pain diarrhea or vomiting.  Past Medical History  Diagnosis Date  . MS (multiple sclerosis) ([place])     BED BOUND -UP IN W/C USING HOYER LIFT- UNABLE TO MOVE ARMS AND LEGS - NOT ABLE TO FEED SELF- NO TROUBLE SWALLOWING - STATES SHE EATS WELL; INCONTINENT - WEARS DEPENDS  . Perinephric abscess     HOSPITALIZED 07/29/13 TO 08/05/13.WITH SEPSIS  . Hypothyroidism   . Anemia   . Xanthogranulomatous pyelonephritis     RIGHT    Past Surgical History  Procedure Laterality Date  . No past surgeries    . Robot assisted laparoscopic nephrectomy Right 10/05/2013    Procedure: ROBOTIC ASSISTED LAPAROSCOPIC RADICAL NEPHRECTOMY;  Surgeon: Alexis Frock, MD;  Location: [facility];  Service: Urology;  Laterality: Right;  . Nephrolithotomy Left 09/13/2014    Procedure: NEPHROLITHOTOMY PERCUTANEOUS;  Surgeon: Alexis Frock, MD;  Location: [facility];  Service: Urology;  Laterality: Left;   History reviewed. No pertinent family history. Social History  Substance Use Topics  . Smoking status: Former Smoker    Quit  date: 08/08/1997  . Smokeless tobacco: Never Used  . Alcohol Use: No   OB History    No data available     Review of Systems  Constitutional: Positive for fever and chills.  HENT: Negative for congestion and rhinorrhea.   Eyes: Negative for redness and visual disturbance.  Respiratory: Negative for shortness of breath and wheezing.   Cardiovascular: Negative for chest pain and palpitations.  Gastrointestinal: Negative for nausea and vomiting.  Genitourinary: Negative for dysuria and urgency.  Musculoskeletal: Negative for myalgias and arthralgias.  Skin: Negative for pallor and wound.  Neurological: Negative for dizziness and headaches.      Allergies  Review of patient's allergies indicates no known allergies.  Home Medications   Prior to Admission medications   Medication Sig Start Date End Date Taking? Authorizing Provider  baclofen (LIORESAL) 10 MG tablet Take 10 mg by mouth 2 (two) times daily.   Yes Historical Provider, MD  benztropine (COGENTIN) 1 MG tablet Take 1 mg by mouth 2 (two) times daily.   Yes Historical Provider, MD  docusate sodium (COLACE) 100 MG capsule Take 100 mg by mouth daily as needed for mild constipation.   Yes Historical Provider, MD  Interferon Beta-1b (BETASERON/EXTAVIA) 0.3 MG KIT injection Inject 0.3 mg into the skin every other day.   Yes Historical Provider, MD  levothyroxine (SYNTHROID, LEVOTHROID) 50 MCG tablet Take 50 mcg by mouth daily before breakfast.   Yes Historical Provider, MD  potassium chloride SA (K-DUR,KLOR-CON) 20 MEQ tablet Take 20  mEq by mouth every morning.   Yes Historical Provider, MD  simvastatin (ZOCOR) 5 MG tablet Take 5 mg by mouth at bedtime.    Yes Historical Provider, MD  cefpodoxime (VANTIN) 100 MG tablet Take 1 tablet (100 mg total) by mouth 2 (two) times daily. X 3 days. Begin day prior to next Urology appointment Patient not taking: Reported on 07/07/2015 09/13/14   Alexis Frock, MD  ciprofloxacin (CIPRO) 500 MG  tablet Take 1 tablet (500 mg total) by mouth 2 (two) times daily. One po bid x 7 days 07/07/15   Deno Etienne, DO  ferrous sulfate 325 (65 FE) MG tablet Take 1 tablet (325 mg total) by mouth 3 (three) times daily with meals. Patient not taking: Reported on 07/07/2015 08/05/13   Belkys A Regalado, MD  oxyCODONE-acetaminophen (ROXICET) 5-325 MG per tablet Take 1-2 tablets by mouth every 4 (four) hours as needed for moderate pain or severe pain. Post-operatively Patient not taking: Reported on 07/07/2015 09/13/14   Alexis Frock, MD  senna-docusate (SENOKOT-S) 8.6-50 MG per tablet Take 1 tablet by mouth 2 (two) times daily. While taking pain meds to prevent constipation Patient not taking: Reported on 07/07/2015 09/13/14   Alexis Frock, MD   BP 102/79 mmHg  Pulse 94  Temp(Src) 98.1 F (36.7 C) (Oral)  Resp 18  SpO2 99%  LMP 06/28/2015 (Exact Date) Physical Exam  Constitutional: She is oriented to person, place, and time. She appears well-developed and well-nourished. No distress.  HENT:  Head: Normocephalic and atraumatic.  Eyes: EOM are normal. Pupils are equal, round, and reactive to light.  Neck: Normal range of motion. Neck supple.  Cardiovascular: Normal rate and regular rhythm.  Exam reveals no gallop and no friction rub.   No murmur heard. Pulmonary/Chest: Effort normal. She has no wheezes. She has no rales.  Abdominal: Soft. She exhibits no distension. There is no tenderness. There is no rebound and no guarding.  Musculoskeletal: She exhibits no edema or tenderness.  Upper extremity contractures  Neurological: She is alert and oriented to person, place, and time.  Skin: Skin is warm and dry. She is not diaphoretic.  Psychiatric: She has a normal mood and affect. Her behavior is normal.  Nursing note and vitals reviewed.   ED Course  Procedures (including critical care time) Labs Review Labs Reviewed  CBC WITH DIFFERENTIAL/PLATELET - Abnormal; Notable for the following:    RDW 17.2  (*)    All other components within normal limits  BASIC METABOLIC PANEL - Abnormal; Notable for the following:    Glucose, Bld 187 (*)    Creatinine, Ser 1.03 (*)    All other components within normal limits  URINALYSIS, ROUTINE W REFLEX MICROSCOPIC (NOT AT [facility]) - Abnormal; Notable for the following:    APPearance HAZY (*)    Nitrite POSITIVE (*)    All other components within normal limits  URINE MICROSCOPIC-ADD ON - Abnormal; Notable for the following:    Squamous Epithelial / LPF 0-5 (*)    Bacteria, UA MANY (*)    All other components within normal limits    Imaging Review Dg Chest 2 View  07/07/2015  CLINICAL DATA:  Fever and chills EXAM: CHEST  2 VIEW COMPARISON:  08/05/2014 FINDINGS: Cardiac shadow is stable. Stable scarring is noted in the right lung base. No focal infiltrate or sizable effusion is seen. Elevation of the right hemidiaphragm is again noted. No bony abnormality is seen. IMPRESSION: Stable scarring in the right base.  No  acute abnormality noted. Electronically Signed   By: Inez Catalina M.D.   On: 07/07/2015 11:27   I have personally reviewed and evaluated these images and lab results as part of my medical decision-making.   EKG Interpretation None      MDM   Final diagnoses:  UTI (lower urinary tract infection)    45 yo F with a chief complaints of fevers and chills. And have a urinary tract infection on UA. Will treat with Cipro. PCP follow-up. 2:57 PM:  I have discussed the diagnosis/risks/treatment options with the patient and family and believe the pt to be eligible for discharge home to follow-up with PCP. We also discussed returning to the ED immediately if new or worsening sx occur. We discussed the sx which are most concerning (e.g., sudden worsening pain, fever, inability to tolerate by mouth) that necessitate immediate return. Medications administered to the patient during their visit and any new prescriptions provided to the patient are listed  below.  Medications given during this visit Medications  sodium chloride 0.9 % bolus 1,000 mL (0 mLs Intravenous Stopped 07/07/15 1413)    Discharge Medication List as of 07/07/2015  1:47 PM    START taking these medications   Details  ciprofloxacin (CIPRO) 500 MG tablet Take 1 tablet (500 mg total) by mouth 2 (two) times daily. One po bid x 7 days, Starting 07/07/2015, Until Discontinued, Print        The patient appears reasonably screen and/or stabilized for discharge and I doubt any other medical condition or other [facility] requiring further screening, evaluation, or treatment in the ED at this time prior to discharge.      Deno Etienne, DO 07/07/15 1457

## 2015-12-17 IMAGING — CT CT ABDOMEN W/O CM
1 of 4 series · 14 of 32 positions shown, 18 images · non-contrast
Comparison: 07/30/2013 and earlier studies

CLINICAL DATA: Assess abscess post drain catheter placement.
Clinically improving. Minimal output.

EXAM:
CT ABDOMEN WITHOUT CONTRAST
TECHNIQUE: Multidetector CT imaging of the abdomen was performed following the
standard protocol without IV contrast.

[Series 2: rtn ap without · axial · non-contrast · 0.90mm/px · z∈[-253,+22]mm · 14 of 61 slices shown, 18 images]
[im 3/61  soft-tissue]
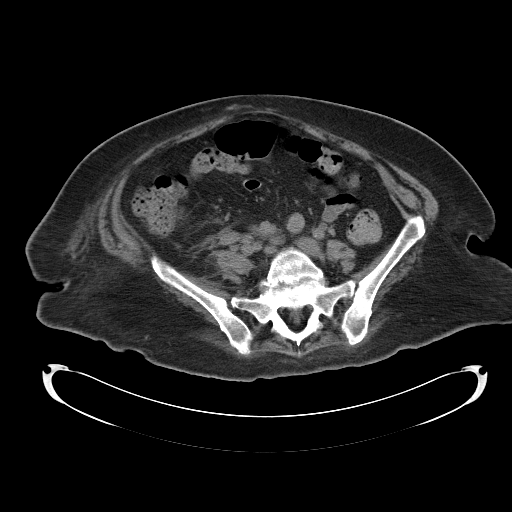
[im 3/61  bone]
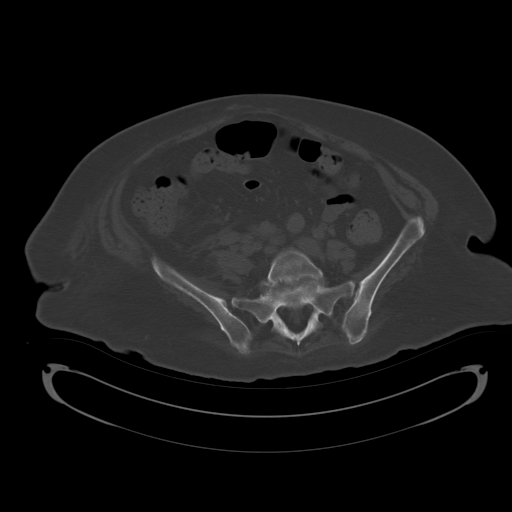
[im 9/61  soft-tissue]
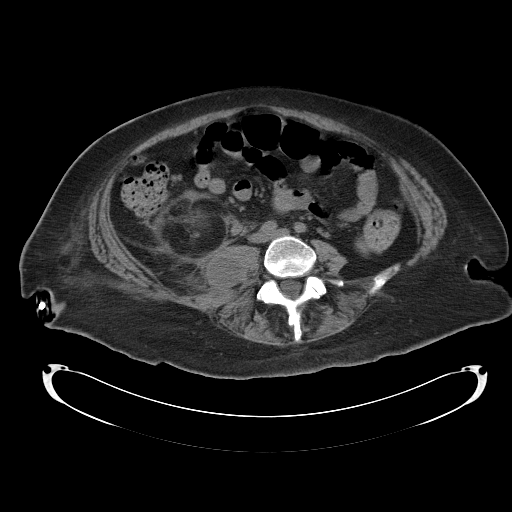
[im 15/61  soft-tissue]
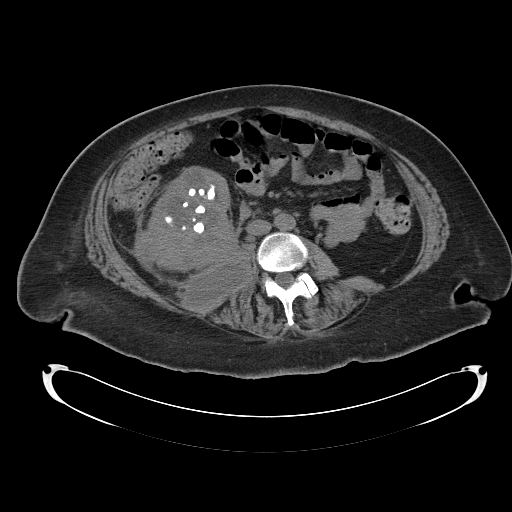
[im 18/61  soft-tissue]
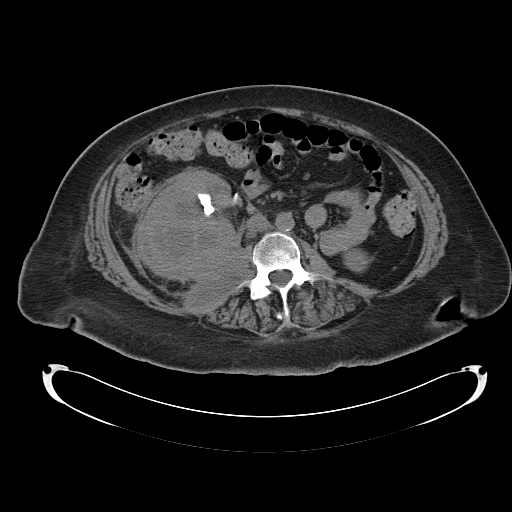
[im 23/61  soft-tissue]
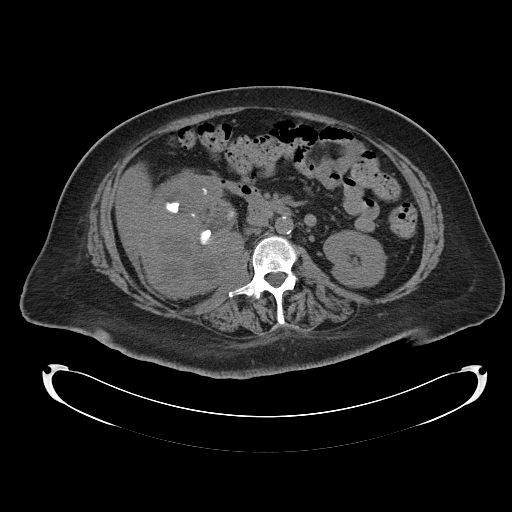
[im 29/61  soft-tissue]
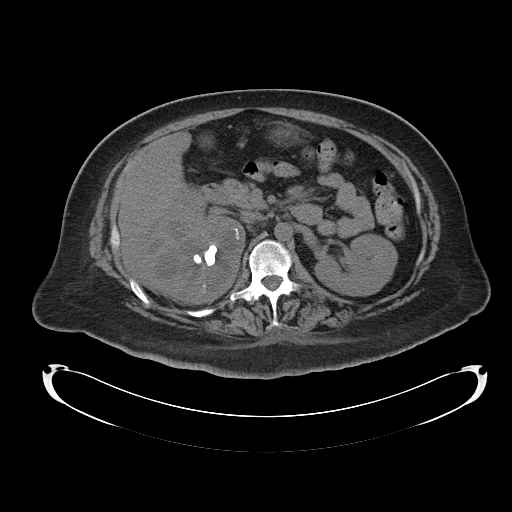
[im 32/61  soft-tissue]
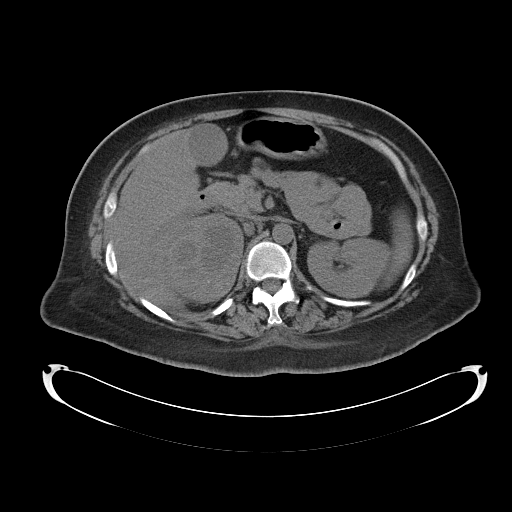
[im 38/61  soft-tissue]
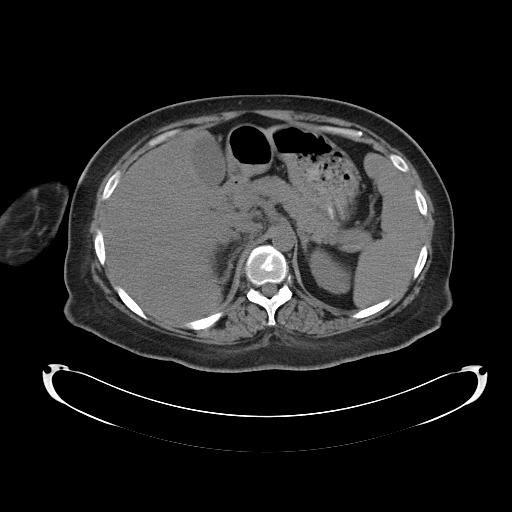
[im 43/61  soft-tissue]
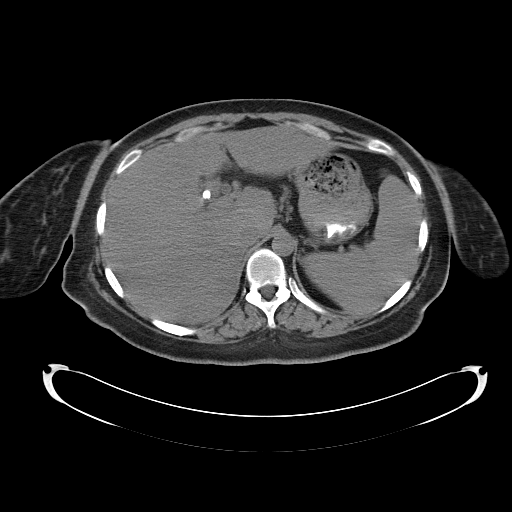
[im 43/61  bone]
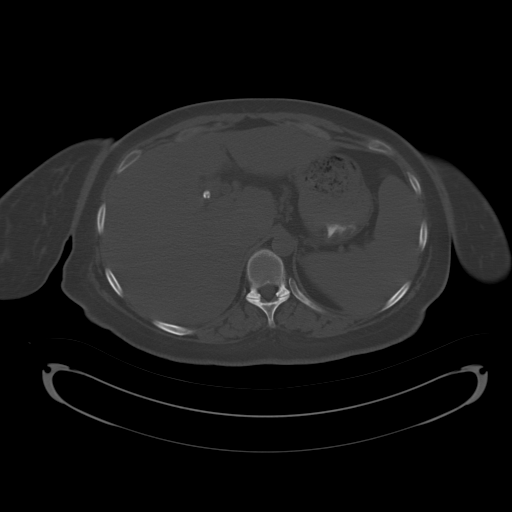
[im 46/61  soft-tissue]
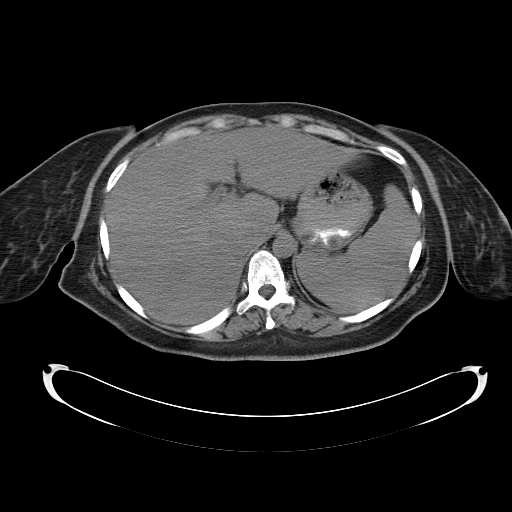
[im 49/61  lung]
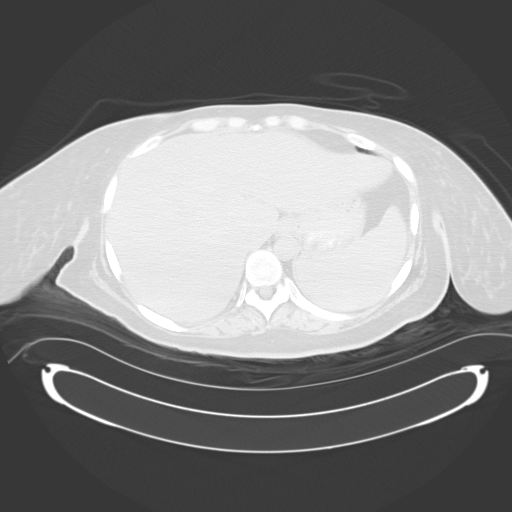
[im 52/61  soft-tissue]
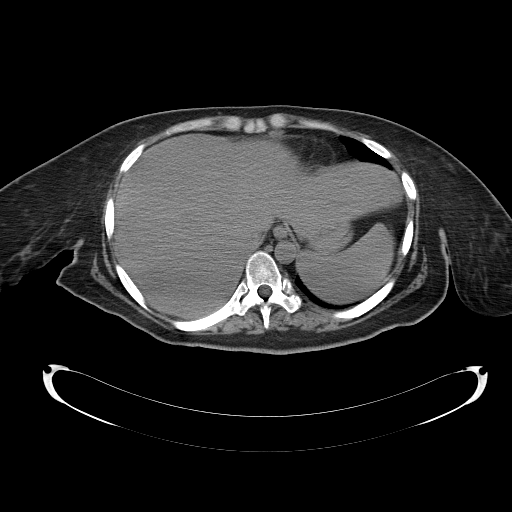
[im 52/61  lung]
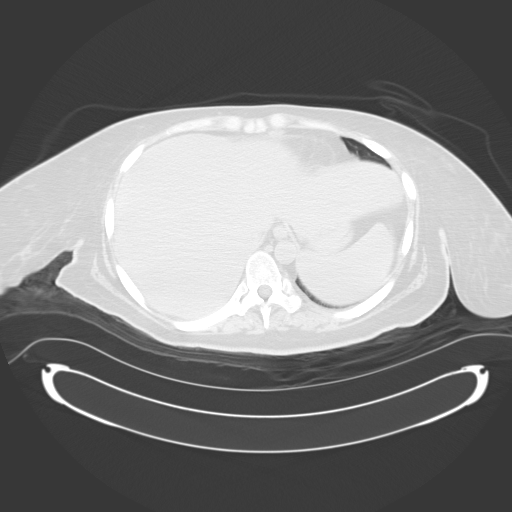
[im 55/61  lung]
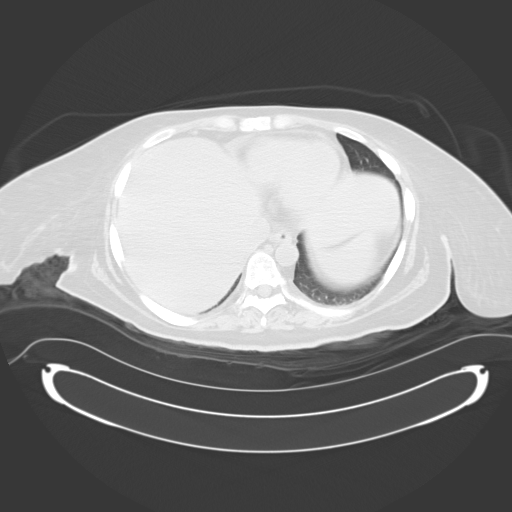
[im 58/61  soft-tissue]
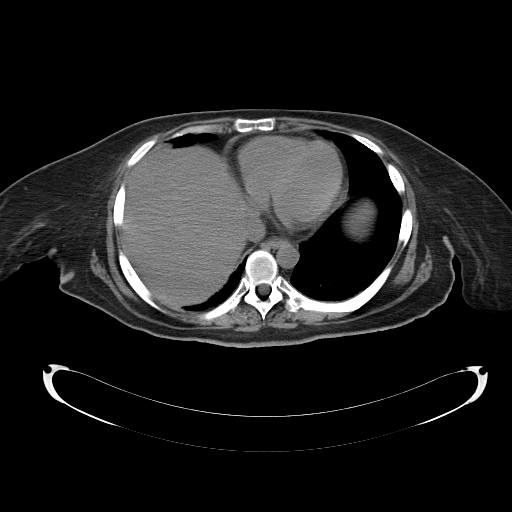
[im 58/61  lung]
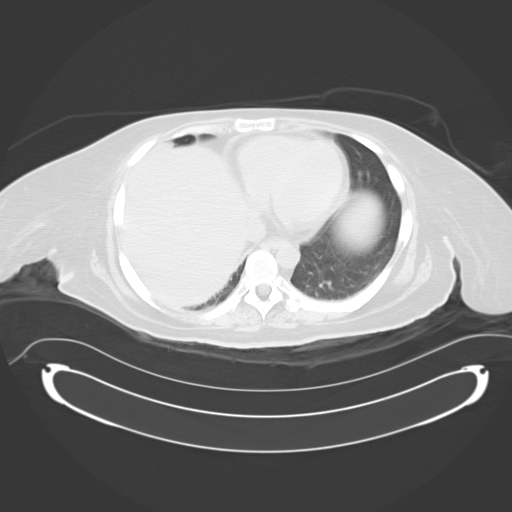

[14 of 32 positions shown; findings below may reference images not displayed]

FINDINGS: Atelectasis at the right lung base as before.  No  pleural effusion.

The percutaneous drain catheter has significantly retracted, tip in
superficial subcutaneous tissues of the right lateral flank.
Interval decrease in size right psoas low attenuating collection,
approximately 2 x 5.5 cm maximum transverse dimensions (previously
5.8 x 7.6). No new or enlarging undrained collection.

Innumerable right renal calculi with an obstructing 28mm UPJ
calculus. Persistent hydronephrosis with globular enlargement of
right kidney. Stable surrounding inflammatory/edematous changes.

There is a 1 mm calculus in the lower pole left renal collecting
system. No left hydronephrosis.

9 mm partially calcified calculus in the dependent aspect of the
gallbladder. Unremarkable liver, spleen, adrenal glands, pancreas.
Unenhanced CT was performed per clinician order. Lack of IV contrast
limits sensitivity and specificity, especially for evaluation of
abdominal/pelvic solid viscera.

Mild patchy aortoiliac arterial calcifications. Stomach and
visualized portions of small bowel and colon nondilated. No ascites.
No free air. Subcentimeter left para-aortic, aortocaval, and
pericaval adenopathy as before.

Bilateral L5 pars defects with degenerative disc disease L5-S1, no
spondylolisthesis.
IMPRESSION: 1. Interval retraction of retroperitoneal drain catheter into the
subcutaneous tissues, with smaller residual right psoas collection.
Findings were telephoned to Dr. Yiyo. The malpositioned drain will
be removed. Given the patient's favorable clinical course, repeat
drain catheter placement will be deferred.
2. Right nephrolithiasis with large obstructing UPJ calculus ,
hydronephrosis, and nephromegaly as previously described.
3. Left nephrolithiasis without ureteral calculus or hydronephrosis.
4. Cholelithiasis

## 2017-11-15 ENCOUNTER — Encounter (HOSPITAL_COMMUNITY): Payer: Self-pay | Admitting: *Deleted

## 2017-11-15 ENCOUNTER — Other Ambulatory Visit: Payer: Self-pay

## 2017-11-15 ENCOUNTER — Other Ambulatory Visit: Payer: Self-pay | Admitting: Urology

## 2017-11-16 ENCOUNTER — Encounter (HOSPITAL_COMMUNITY): Payer: Self-pay | Admitting: Anesthesiology

## 2017-11-16 ENCOUNTER — Encounter (HOSPITAL_COMMUNITY)
Admission: RE | Disposition: A | Discharge status: Home / Self Care | Payer: Self-pay | Source: Ambulatory Visit | Attending: Urology

## 2017-11-16 ENCOUNTER — Ambulatory Visit (HOSPITAL_COMMUNITY): Payer: Medicare Other

## 2017-11-16 ENCOUNTER — Ambulatory Visit (HOSPITAL_COMMUNITY): Payer: Medicare Other | Admitting: Anesthesiology

## 2017-11-16 ENCOUNTER — Ambulatory Visit (HOSPITAL_COMMUNITY)
Admission: RE | Admit: 2017-11-16 | Discharge: 2017-11-16 | Disposition: A | Discharge status: Home / Self Care | Payer: Medicare Other | Source: Ambulatory Visit | Attending: Urology | Admitting: Urology

## 2017-11-16 DIAGNOSIS — N201 Calculus of ureter: Secondary | ICD-10-CM | POA: Insufficient documentation

## 2017-11-16 DIAGNOSIS — Z87891 Personal history of nicotine dependence: Secondary | ICD-10-CM | POA: Insufficient documentation

## 2017-11-16 DIAGNOSIS — G35 Multiple sclerosis: Secondary | ICD-10-CM | POA: Insufficient documentation

## 2017-11-16 DIAGNOSIS — E039 Hypothyroidism, unspecified: Secondary | ICD-10-CM | POA: Diagnosis not present

## 2017-11-16 DIAGNOSIS — Q600 Renal agenesis, unilateral: Secondary | ICD-10-CM | POA: Diagnosis not present

## 2017-11-16 DIAGNOSIS — G8250 Quadriplegia, unspecified: Secondary | ICD-10-CM | POA: Insufficient documentation

## 2017-11-16 DIAGNOSIS — Z79899 Other long term (current) drug therapy: Secondary | ICD-10-CM | POA: Insufficient documentation

## 2017-11-16 DIAGNOSIS — D649 Anemia, unspecified: Secondary | ICD-10-CM | POA: Diagnosis not present

## 2017-11-16 HISTORY — PX: CYSTOSCOPY W/ URETERAL STENT PLACEMENT: SHX1429

## 2017-11-16 LAB — BASIC METABOLIC PANEL
Anion gap: 11 (ref 5–15)
BUN: 10 mg/dL (ref 6–20)
CALCIUM: 9.6 mg/dL (ref 8.9–10.3)
CHLORIDE: 105 mmol/L (ref 98–111)
CO2: 21 mmol/L — AB (ref 22–32)
CREATININE: 1 mg/dL (ref 0.44–1.00)
GFR calc Af Amer: >60 mL/min (ref >60)
GFR calc non Af Amer: >60 mL/min (ref >60)
Glucose, Bld: 80 mg/dL (ref 70–99)
Potassium: 5.1 mmol/L (ref 3.5–5.1)
SODIUM: 137 mmol/L (ref 135–145)

## 2017-11-16 LAB — CBC
HCT: 41.7 % (ref 36.0–46.0)
Hemoglobin: 11.9 g/dL — ABNORMAL LOW (ref 12.0–15.0)
MCH: 23 pg — ABNORMAL LOW (ref 26.0–34.0)
MCHC: 28.5 g/dL — AB (ref 30.0–36.0)
MCV: 80.7 fL (ref 80.0–100.0)
Platelets: 250 10*3/uL (ref 150–400)
RBC: 5.17 MIL/uL — ABNORMAL HIGH (ref 3.87–5.11)
RDW: 19.5 % — AB (ref 11.5–15.5)
WBC: 5.2 10*3/uL (ref 4.0–10.5)
nRBC: 0 % (ref 0.0–0.2)

## 2017-11-16 SURGERY — CYSTOSCOPY, WITH RETROGRADE PYELOGRAM AND URETERAL STENT INSERTION
Anesthesia: General | Laterality: Left

## 2017-11-16 MED ORDER — PROPOFOL 10 MG/ML IV BOLUS
INTRAVENOUS | Status: AC
  Filled 2017-11-16: qty 20

## 2017-11-16 MED ORDER — LIDOCAINE 2% (20 MG/ML) 5 ML SYRINGE
INTRAMUSCULAR | Status: DC | PRN
  Administered 2017-11-16: 75 mg via INTRAVENOUS

## 2017-11-16 MED ORDER — SODIUM CHLORIDE 0.9 % IV SOLN
2.0000 g | INTRAVENOUS | Status: AC
  Administered 2017-11-16: 2 g via INTRAVENOUS
  Filled 2017-11-16: qty 20

## 2017-11-16 MED ORDER — FENTANYL CITRATE (PF) 100 MCG/2ML IJ SOLN: 25.0000 ug | INTRAMUSCULAR | Status: DC | PRN

## 2017-11-16 MED ORDER — MIDAZOLAM HCL 2 MG/2ML IJ SOLN
INTRAMUSCULAR | Status: AC
  Filled 2017-11-16: qty 2

## 2017-11-16 MED ORDER — FENTANYL CITRATE (PF) 100 MCG/2ML IJ SOLN
INTRAMUSCULAR | Status: AC
  Filled 2017-11-16: qty 2

## 2017-11-16 MED ORDER — ONDANSETRON HCL 4 MG/2ML IJ SOLN: 4.0000 mg | Freq: Once | INTRAMUSCULAR | Status: DC | PRN

## 2017-11-16 MED ORDER — MEPERIDINE HCL 50 MG/ML IJ SOLN: 6.2500 mg | INTRAMUSCULAR | Status: DC | PRN

## 2017-11-16 MED ORDER — LACTATED RINGERS IV SOLN
INTRAVENOUS | Status: DC
  Administered 2017-11-16: 14:00:00 via INTRAVENOUS

## 2017-11-16 MED ORDER — MIDAZOLAM HCL 5 MG/5ML IJ SOLN
INTRAMUSCULAR | Status: DC | PRN
  Administered 2017-11-16: 2 mg via INTRAVENOUS

## 2017-11-16 MED ORDER — TRAMADOL HCL 50 MG PO TABS: 50.0000 mg | ORAL_TABLET | Freq: Four times a day (QID) | ORAL | 0 refills | Status: DC | PRN

## 2017-11-16 MED ORDER — ONDANSETRON HCL 4 MG/2ML IJ SOLN
INTRAMUSCULAR | Status: DC | PRN
  Administered 2017-11-16: 4 mg via INTRAVENOUS

## 2017-11-16 MED ORDER — IOHEXOL 300 MG/ML  SOLN
INTRAMUSCULAR | Status: DC | PRN
  Administered 2017-11-16: 9 mL via URETHRAL

## 2017-11-16 MED ORDER — DEXAMETHASONE SODIUM PHOSPHATE 10 MG/ML IJ SOLN
INTRAMUSCULAR | Status: AC
  Filled 2017-11-16: qty 1

## 2017-11-16 MED ORDER — DEXAMETHASONE SODIUM PHOSPHATE 10 MG/ML IJ SOLN
INTRAMUSCULAR | Status: DC | PRN
  Administered 2017-11-16: 5 mg via INTRAVENOUS

## 2017-11-16 MED ORDER — FENTANYL CITRATE (PF) 100 MCG/2ML IJ SOLN
INTRAMUSCULAR | Status: DC | PRN
  Administered 2017-11-16: 50 ug via INTRAVENOUS

## 2017-11-16 MED ORDER — PROPOFOL 10 MG/ML IV BOLUS
INTRAVENOUS | Status: DC | PRN
  Administered 2017-11-16: 150 mg via INTRAVENOUS

## 2017-11-16 MED ORDER — ONDANSETRON HCL 4 MG/2ML IJ SOLN
INTRAMUSCULAR | Status: AC
  Filled 2017-11-16: qty 2

## 2017-11-16 MED ORDER — SODIUM CHLORIDE 0.9 % IR SOLN
Status: DC | PRN
  Administered 2017-11-16: 3000 mL via INTRAVESICAL

## 2017-11-16 SURGICAL SUPPLY — 14 items
BAG URO CATCHER STRL LF (MISCELLANEOUS) ×3 IMPLANT
BASKET ZERO TIP NITINOL 2.4FR (BASKET) IMPLANT
Boston Scientific ×3 IMPLANT
CATH INTERMIT  6FR 70CM (CATHETERS) ×3 IMPLANT
CLOTH BEACON ORANGE TIMEOUT ST (SAFETY) IMPLANT
COVER WAND RF STERILE (DRAPES) IMPLANT
GLOVE BIOGEL M STRL SZ7.5 (GLOVE) ×3 IMPLANT
GOWN STRL REUS W/TWL LRG LVL3 (GOWN DISPOSABLE) ×3 IMPLANT
GUIDEWIRE ANG ZIPWIRE 038X150 (WIRE) ×3 IMPLANT
GUIDEWIRE STR DUAL SENSOR (WIRE) IMPLANT
MANIFOLD NEPTUNE II (INSTRUMENTS) ×3 IMPLANT
PACK CYSTO (CUSTOM PROCEDURE TRAY) ×3 IMPLANT
TUBING CONNECTING 10 (TUBING) ×2 IMPLANT
TUBING CONNECTING 10' (TUBING) ×1

## 2017-11-16 NOTE — Op Note (Signed)
NAME: Jacqueline Norman, GRILLOT MEDICAL RECORD [id number] ACCOUNT [id number] DATE OF BIRTH:05-26-1970 FACILITY: [facility] LOCATION: [facility] PHYSICIAN: Berneice Heinrich, MD  OPERATIVE REPORT  DATE OF PROCEDURE:  11/16/2017  PREOPERATIVE DIAGNOSIS:   1.  Functional paraplegia  2.  Solitary left kidney. 3.  Left ureteral stone, partially obstructing.  PROCEDURE: 1.  Cystoscopy, left retrograde pyelogram, interpretation. 2.  Insertion of left ureteral stent, 6 x 22 Contour, no tether.  ESTIMATED BLOOD LOSS:  Nil.  COMPLICATIONS:  None.  SPECIMENS:  Urine in the left renal pelvis culture.  INDICATIONS:  The patient is a pleasant unfortunate 47 year old lady with history of advanced neurologic disease.  She is functionally a paraplegic.  She also has a solitary left kidney and recurrent nephrolithiasis.  She was found on surveillance of  this to have a left proximal ureteral stone in her solitary kidney.  Fortunately, this appeared to be partially obstructing as she remained with excellent urine output and normal renal function by serum markers.  It was felt, however, that urgent  stenting would be warranted to allow for renal decompression in a staged approach to achieve goal of stone free status.  She does have a history of recurrent infections.  It was felt that a staged approach would be most safe to allow for renal  decompression and clearance of any interval infectious parameters for definitive stone management.  She presents for stenting today.  Informed consent was then placed in medical record.  DESCRIPTION OF PROCEDURE:  The patient being [name], the procedure being left ureteral stent placement was confirmed.  Procedure timeout was performed.  Antibiotics administered.  General anesthesia induced.  The patient was placed into a low  lithotomy position, sterile field was created prepped and draping the patient's vagina, introitus and proximal thighs using iodine.  Cystourethroscopy was  performed with a rigid cystoscope with offset lens.  Inspection of bladder revealed no diverticula,  calcifications, papillary lesions.  The left ureteral orifice was cannulated with a sequential catheter and left retrograde pyelogram was obtained.  Left retrograde pyelogram demonstrated a single left ureter, single system left kidney.  There was a questionable filling defect in the proximal ureter consistent with known stone.  A 0.038 ZIPwire was advanced through the renal pelvis over which a new 6  x 22 contour Polaris-type stent was placed using cystoscopic and fluoroscopic guidance.  Good proximal and distal plane were noted.  Efflux of urine was seen around into the distal end of the stent.  A culture was obtained of this fluid.  Bladder was  empty per cystoscope.  Procedure terminated.  The patient tolerated the procedure well.  No immediate apparent complications.  The patient was taken to postanesthesia care in stable condition.  AN/NUANCE  D:11/16/2017 T:11/16/2017 JOB:[id number]

## 2017-11-16 NOTE — Anesthesia Preprocedure Evaluation (Addendum)
Anesthesia Evaluation  Patient identified by MRN, date of birth, ID band Patient awake    Reviewed: Allergy & Precautions, NPO status , Patient's Chart, lab work & pertinent test results  Airway Mallampati: II  TM Distance: >3 FB Neck ROM: Full    Dental no notable dental hx. (+) Teeth Intact   Pulmonary former smoker,    Pulmonary exam normal breath sounds clear to auscultation       Cardiovascular negative cardio ROS Normal cardiovascular exam Rhythm:Regular Rate:Normal     Neuro/Psych Multiple Sclerosis- bed bound unable to move extremities  Neuromuscular disease negative psych ROS   GI/Hepatic negative GI ROS, Neg liver ROS,   Endo/Other  Hypothyroidism Obesity Hyperlipidemia  Renal/GU ARFRenal diseaseHx/o staghorn calculi Hx/o ARF  negative genitourinary   Musculoskeletal negative musculoskeletal ROS (+)   Abdominal (+) + obese,   Peds  Hematology  (+) anemia ,   Anesthesia Other Findings   Reproductive/Obstetrics                             Anesthesia Physical Anesthesia Plan  ASA: III  Anesthesia Plan: General   Post-op Pain Management:    Induction: Intravenous  PONV Risk Score and Plan: 3 and Ondansetron, Dexamethasone and Treatment may vary due to age or medical condition  Airway Management Planned: LMA  Additional Equipment:   Intra-op Plan:   Post-operative Plan: Extubation in OR  Informed Consent: I have reviewed the patients History and Physical, chart, labs and discussed the procedure including the risks, benefits and alternatives for the proposed anesthesia with the patient or authorized representative who has indicated his/her understanding and acceptance.   Dental advisory given  Plan Discussed with: CRNA and Surgeon  Anesthesia Plan Comments:         Anesthesia Quick Evaluation

## 2017-11-16 NOTE — Brief Op Note (Signed)
11/16/2017  3:02 PM  PATIENT:  Jacqueline Norman  47 y.o. female  PRE-OPERATIVE DIAGNOSIS:  LEFT RENAL STONE, SOLITARY KIDNEY  POST-OPERATIVE DIAGNOSIS:  LEFT RENAL STONE, SOLITARY KIDNEY  PROCEDURE:  Procedure(s): CYSTOSCOPY WITH RETROGRADE PYELOGRAM/URETERAL STENT PLACEMENT (Left)  SURGEON:  Surgeon(s) and Role:    * Sebastian Ache, MD - Primary  PHYSICIAN ASSISTANT:   ASSISTANTS: none   ANESTHESIA:   general  EBL:  5 mL   BLOOD ADMINISTERED:none  DRAINS: none   LOCAL MEDICATIONS USED:  NONE  SPECIMEN:  No Specimen  DISPOSITION OF SPECIMEN:  N/A  COUNTS:  YES  TOURNIQUET:  * No tourniquets in log *  DICTATION: .Other Dictation: Dictation Number [id number]  PLAN OF CARE: Discharge to home after PACU  PATIENT DISPOSITION:  PACU - hemodynamically stable.   Delay start of Pharmacological VTE agent (>24hrs) due to surgical blood loss or risk of bleeding: yes

## 2017-11-16 NOTE — Anesthesia Procedure Notes (Addendum)
Procedure Name: LMA Insertion Date/Time: 11/16/2017 2:38 PM Performed by: Elyn Peers, CRNA Pre-anesthesia Checklist: Patient identified, Emergency Drugs available, Suction available, Patient being monitored and Timeout performed Patient Re-evaluated:Patient Re-evaluated prior to induction Oxygen Delivery Method: Circle system utilized Preoxygenation: Pre-oxygenation with 100% oxygen Induction Type: IV induction Ventilation: Mask ventilation without difficulty LMA: LMA inserted LMA Size: 4.0 Number of attempts: 1 Placement Confirmation: positive ETCO2 and breath sounds checked- equal and bilateral Tube secured with: Tape Dental Injury: Teeth and Oropharynx as per pre-operative assessment  Comments: Poor dentition noted prior to placement of LMA.   Missing upper front tooth noted.

## 2017-11-16 NOTE — Anesthesia Postprocedure Evaluation (Signed)
Anesthesia Post Note  Patient: Jacqueline Norman  Procedure(s) Performed: CYSTOSCOPY WITH RETROGRADE PYELOGRAM/URETERAL STENT PLACEMENT (Left )     Patient location during evaluation: PACU Anesthesia Type: General Level of consciousness: awake and alert Pain management: pain level controlled Vital Signs Assessment: post-procedure vital signs reviewed and stable Respiratory status: spontaneous breathing, nonlabored ventilation and respiratory function stable Cardiovascular status: blood pressure returned to baseline and stable Postop Assessment: no apparent nausea or vomiting Anesthetic complications: no    Last Vitals:  Vitals:   11/16/17 1530 11/16/17 1545  BP: 131/88 136/89  Pulse: 71 70  Resp: 15 16  Temp:    SpO2: 100% 97%    Last Pain:  Vitals:   11/16/17 1545  TempSrc:   PainSc: 0-No pain                 Lowella Curb

## 2017-11-16 NOTE — Progress Notes (Signed)
Received call from lab that serum pregnancy needs to be redrawn. Patient is in surgery.

## 2017-11-16 NOTE — Discharge Instructions (Signed)
1 - You may have urinary urgency (bladder spasms) and bloody urine on / off with stent in place. This is normal. ° °2 - Call MD or go to ER for fever >102, severe pain / nausea / vomiting not relieved by medications, or acute change in medical status ° °

## 2017-11-16 NOTE — Transfer of Care (Signed)
Immediate Anesthesia Transfer of Care Note  Patient: Jacqueline Norman  Procedure(s) Performed: CYSTOSCOPY WITH RETROGRADE PYELOGRAM/URETERAL STENT PLACEMENT (Left )  Patient Location: PACU  Anesthesia Type:General  Level of Consciousness: awake, drowsy and responds to stimulation  Airway & Oxygen Therapy: Patient Spontanous Breathing and Patient connected to face mask oxygen  Post-op Assessment: Report given to RN and Post -op Vital signs reviewed and stable  Post vital signs: Reviewed and stable  Last Vitals:  Vitals Value Taken Time  BP    Temp    Pulse 75 11/16/2017  3:13 PM  Resp 14 11/16/2017  3:13 PM  SpO2 100 % 11/16/2017  3:13 PM  Vitals shown include unvalidated device data.  Last Pain:  Vitals:   11/16/17 1353  TempSrc:   PainSc: 0-No pain         Complications: No apparent anesthesia complications

## 2017-11-16 NOTE — H&P (Signed)
Jacqueline Norman is an 47 y.o. female.    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 Most recent Cr <1.5.    2 - Chronic Bacteruria- many episodeds cystitis, several febrile UTI's. Most recent UCX 07/2015, negative. Previously e. coli sens Vantin.    3 - Large Left Renal Stone - s/p left PCNL 09/2014 for large volume left renal stone in solitary kidney. F/u nephrostogram confirms no residual filling defects.    Recent Surveillance:   04/2015 KUB stone free, 10/2015 CT 3mm extreme left lower pole (likely papillary tip), no hydro  11/2016 KUB - no visualized stones.  11/2017 - 12mm UPJ sotne with mod hydro + LLP 3mm stone by CT, Cr 0.8. Multiple attempts made to call pt but unable.    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 Her PCP is Florentina Jenny with [facility]. Her neurologist is Dr. Anne Hahn.    Today Jacqueline Norman is seen to proceed with cysto and left stent placement for partially obstructing stone in solitary kidney. No interval fevers.    Past Medical History:  Diagnosis Date  . Anemia   . Hypothyroidism   . MS (multiple sclerosis) (HCC)    BED BOUND -UP IN W/C USING HOYER LIFT- UNABLE TO MOVE ARMS AND LEGS - NOT ABLE TO FEED SELF- NO TROUBLE SWALLOWING - STATES SHE EATS WELL; INCONTINENT - WEARS DEPENDS  . Perinephric abscess    HOSPITALIZED 07/29/13 TO 08/05/13.WITH SEPSIS  . Xanthogranulomatous pyelonephritis    RIGHT     Past Surgical History:  Procedure Laterality Date  . NEPHROLITHOTOMY Left 09/13/2014   Procedure: NEPHROLITHOTOMY PERCUTANEOUS;  Surgeon: Sebastian Ache, MD;  Location: [facility];  Service: Urology;  Laterality: Left;  . NO PAST SURGERIES    . ROBOT ASSISTED LAPAROSCOPIC NEPHRECTOMY Right 10/05/2013   Procedure: ROBOTIC ASSISTED LAPAROSCOPIC RADICAL  NEPHRECTOMY;  Surgeon: Sebastian Ache, MD;  Location: [facility];  Service: Urology;  Laterality: Right;    History reviewed. No pertinent family history. Social History:  reports that she quit smoking about 20 years ago. She has never used smokeless tobacco. She reports that she does not drink alcohol or use drugs.  Allergies: No Known Allergies  No medications prior to admission.    No results found for this or any previous visit (from the past 48 hour(s)). No results found.  Review of Systems  Constitutional: Negative.  Negative for chills and fever.  HENT: Negative.   Eyes: Negative.   Respiratory: Negative.   Cardiovascular: Negative.   Gastrointestinal: Negative.   Genitourinary: Negative.  Negative for flank pain and hematuria.  Musculoskeletal: Negative.   Skin: Negative.   Neurological: Negative.   Endo/Heme/Allergies: Negative.   Psychiatric/Behavioral: Negative.     Last menstrual period 10/23/2017. Physical Exam  Constitutional:  Stable stigmata of advanced neurologic disease. AOx3. Mother with her.   Eyes: Pupils are equal, round, and reactive to light.  Neck: Normal range of motion.  Cardiovascular: Normal rate.  Respiratory: Effort normal.  GI:  Stable large truncal obesity.   Genitourinary:  Genitourinary Comments: No CVAT  Neurological: She is alert.  Skin: Skin is warm.  Psychiatric: She has a normal mood and affect.     Assessment/Plan  Proceed as planned with LEFT ureteral stent placement to ensure decompression of solitary kidney. Plan will be then  for ureteroscopy in elective setting after verifying no systemic infectios parameters develop after renal decompression.  Risks, benefits, expected peri-op course discussed in detail with pt and mother.   Sebastian Ache, MD 11/16/2017, 6:56 AM

## 2017-11-17 ENCOUNTER — Encounter (HOSPITAL_COMMUNITY): Payer: Self-pay | Admitting: Urology

## 2017-11-19 LAB — URINE CULTURE

## 2017-12-01 ENCOUNTER — Encounter (HOSPITAL_COMMUNITY): Payer: Self-pay | Admitting: *Deleted

## 2017-12-01 ENCOUNTER — Other Ambulatory Visit: Payer: Self-pay

## 2017-12-02 ENCOUNTER — Other Ambulatory Visit: Payer: Self-pay

## 2017-12-09 ENCOUNTER — Ambulatory Visit (HOSPITAL_COMMUNITY)
Admission: RE | Admit: 2017-12-09 | Discharge: 2017-12-09 | Disposition: A | Discharge status: Home / Self Care | Payer: Medicare Other | Source: Ambulatory Visit | Attending: Urology | Admitting: Urology

## 2017-12-09 ENCOUNTER — Encounter (HOSPITAL_COMMUNITY): Payer: Self-pay | Admitting: *Deleted

## 2017-12-09 ENCOUNTER — Ambulatory Visit (HOSPITAL_COMMUNITY): Payer: Medicare Other

## 2017-12-09 ENCOUNTER — Encounter (HOSPITAL_COMMUNITY)
Admission: RE | Disposition: A | Discharge status: Home / Self Care | Payer: Self-pay | Source: Ambulatory Visit | Attending: Urology

## 2017-12-09 ENCOUNTER — Ambulatory Visit (HOSPITAL_COMMUNITY): Payer: Medicare Other | Admitting: Certified Registered Nurse Anesthetist

## 2017-12-09 ENCOUNTER — Other Ambulatory Visit: Payer: Self-pay

## 2017-12-09 DIAGNOSIS — E039 Hypothyroidism, unspecified: Secondary | ICD-10-CM | POA: Diagnosis not present

## 2017-12-09 DIAGNOSIS — Z79899 Other long term (current) drug therapy: Secondary | ICD-10-CM | POA: Insufficient documentation

## 2017-12-09 DIAGNOSIS — G8250 Quadriplegia, unspecified: Secondary | ICD-10-CM | POA: Diagnosis not present

## 2017-12-09 DIAGNOSIS — Z905 Acquired absence of kidney: Secondary | ICD-10-CM | POA: Insufficient documentation

## 2017-12-09 DIAGNOSIS — Z6828 Body mass index (BMI) 28.0-28.9, adult: Secondary | ICD-10-CM | POA: Diagnosis not present

## 2017-12-09 DIAGNOSIS — G35 Multiple sclerosis: Secondary | ICD-10-CM | POA: Insufficient documentation

## 2017-12-09 DIAGNOSIS — Z79890 Hormone replacement therapy (postmenopausal): Secondary | ICD-10-CM | POA: Diagnosis not present

## 2017-12-09 DIAGNOSIS — Z87440 Personal history of urinary (tract) infections: Secondary | ICD-10-CM | POA: Insufficient documentation

## 2017-12-09 DIAGNOSIS — D649 Anemia, unspecified: Secondary | ICD-10-CM | POA: Diagnosis not present

## 2017-12-09 DIAGNOSIS — N200 Calculus of kidney: Secondary | ICD-10-CM | POA: Diagnosis present

## 2017-12-09 DIAGNOSIS — N202 Calculus of kidney with calculus of ureter: Secondary | ICD-10-CM | POA: Diagnosis not present

## 2017-12-09 DIAGNOSIS — Z87891 Personal history of nicotine dependence: Secondary | ICD-10-CM | POA: Insufficient documentation

## 2017-12-09 HISTORY — PX: HOLMIUM LASER APPLICATION: SHX5852

## 2017-12-09 HISTORY — PX: CYSTOSCOPY WITH RETROGRADE PYELOGRAM, URETEROSCOPY AND STENT PLACEMENT: SHX5789

## 2017-12-09 SURGERY — CYSTOURETEROSCOPY, WITH RETROGRADE PYELOGRAM AND STENT INSERTION
Anesthesia: General | Site: Ureter | Laterality: Left

## 2017-12-09 MED ORDER — DEXTROSE 5 % IV SOLN
INTRAVENOUS | Status: DC | PRN
  Administered 2017-12-09: 2 g via INTRAVENOUS

## 2017-12-09 MED ORDER — 0.9 % SODIUM CHLORIDE (POUR BTL) OPTIME
TOPICAL | Status: DC | PRN
  Administered 2017-12-09: 1000 mL

## 2017-12-09 MED ORDER — LIDOCAINE 2% (20 MG/ML) 5 ML SYRINGE
INTRAMUSCULAR | Status: AC
  Filled 2017-12-09: qty 5

## 2017-12-09 MED ORDER — CIPROFLOXACIN HCL 250 MG PO TABS: 250.0000 mg | ORAL_TABLET | Freq: Two times a day (BID) | ORAL | 0 refills | Status: AC

## 2017-12-09 MED ORDER — SODIUM CHLORIDE 0.9 % IV SOLN
INTRAVENOUS | Status: DC
  Administered 2017-12-09: 12:00:00 via INTRAVENOUS

## 2017-12-09 MED ORDER — IOHEXOL 300 MG/ML  SOLN
INTRAMUSCULAR | Status: DC | PRN
  Administered 2017-12-09: 9 mL

## 2017-12-09 MED ORDER — LIDOCAINE 2% (20 MG/ML) 5 ML SYRINGE
INTRAMUSCULAR | Status: DC | PRN
  Administered 2017-12-09: 60 mg via INTRAVENOUS

## 2017-12-09 MED ORDER — FENTANYL CITRATE (PF) 100 MCG/2ML IJ SOLN
INTRAMUSCULAR | Status: AC
  Filled 2017-12-09: qty 2

## 2017-12-09 MED ORDER — TRAMADOL HCL 50 MG PO TABS: 50.0000 mg | ORAL_TABLET | Freq: Four times a day (QID) | ORAL | 0 refills | Status: AC | PRN

## 2017-12-09 MED ORDER — SODIUM CHLORIDE 0.9 % IV SOLN: 2.0000 g | INTRAVENOUS | Status: DC

## 2017-12-09 MED ORDER — MIDAZOLAM HCL 2 MG/2ML IJ SOLN
INTRAMUSCULAR | Status: AC
  Filled 2017-12-09: qty 2

## 2017-12-09 MED ORDER — MIDAZOLAM HCL 5 MG/5ML IJ SOLN
INTRAMUSCULAR | Status: DC | PRN
  Administered 2017-12-09: 2 mg via INTRAVENOUS

## 2017-12-09 MED ORDER — SODIUM CHLORIDE 0.9 % IV SOLN
INTRAVENOUS | Status: AC
  Filled 2017-12-09: qty 20

## 2017-12-09 MED ORDER — DEXAMETHASONE SODIUM PHOSPHATE 4 MG/ML IJ SOLN
INTRAMUSCULAR | Status: DC | PRN
  Administered 2017-12-09: 4 mg via INTRAVENOUS

## 2017-12-09 MED ORDER — FENTANYL CITRATE (PF) 100 MCG/2ML IJ SOLN
INTRAMUSCULAR | Status: DC | PRN
  Administered 2017-12-09 (×2): 25 ug via INTRAVENOUS

## 2017-12-09 MED ORDER — ONDANSETRON HCL 4 MG/2ML IJ SOLN
INTRAMUSCULAR | Status: DC | PRN
  Administered 2017-12-09: 4 mg via INTRAVENOUS

## 2017-12-09 MED ORDER — ONDANSETRON HCL 4 MG/2ML IJ SOLN
INTRAMUSCULAR | Status: AC
  Filled 2017-12-09: qty 2

## 2017-12-09 MED ORDER — PROPOFOL 10 MG/ML IV BOLUS
INTRAVENOUS | Status: DC | PRN
  Administered 2017-12-09: 100 mg via INTRAVENOUS

## 2017-12-09 MED ORDER — LACTATED RINGERS IV SOLN: INTRAVENOUS | Status: DC | PRN

## 2017-12-09 MED ORDER — DEXAMETHASONE SODIUM PHOSPHATE 10 MG/ML IJ SOLN
INTRAMUSCULAR | Status: AC
  Filled 2017-12-09: qty 1

## 2017-12-09 MED ORDER — SODIUM CHLORIDE 0.9 % IR SOLN
Status: DC | PRN
  Administered 2017-12-09: 6000 mL

## 2017-12-09 SURGICAL SUPPLY — 27 items
BAG URO CATCHER STRL LF (MISCELLANEOUS) ×3 IMPLANT
BASKET LASER NITINOL 1.9FR (BASKET) ×3 IMPLANT
CATH INTERMIT  6FR 70CM (CATHETERS) ×3 IMPLANT
CLOTH BEACON ORANGE TIMEOUT ST (SAFETY) ×3 IMPLANT
COVER SURGICAL LIGHT HANDLE (MISCELLANEOUS) ×3 IMPLANT
COVER WAND RF STERILE (DRAPES) IMPLANT
EXTRACTOR STONE 1.7FRX115CM (UROLOGICAL SUPPLIES) IMPLANT
FIBER LASER FLEXIVA 1000 (UROLOGICAL SUPPLIES) IMPLANT
FIBER LASER FLEXIVA 365 (UROLOGICAL SUPPLIES) IMPLANT
FIBER LASER FLEXIVA 550 (UROLOGICAL SUPPLIES) IMPLANT
FIBER LASER TRAC TIP (UROLOGICAL SUPPLIES) IMPLANT
GLOVE BIOGEL M STRL SZ7.5 (GLOVE) ×3 IMPLANT
GOWN STRL REUS W/TWL LRG LVL3 (GOWN DISPOSABLE) ×6 IMPLANT
GUIDEWIRE ANG ZIPWIRE 038X150 (WIRE) ×3 IMPLANT
GUIDEWIRE STR DUAL SENSOR (WIRE) ×3 IMPLANT
IV NS 1000ML (IV SOLUTION) ×2
IV NS 1000ML BAXH (IV SOLUTION) ×1 IMPLANT
LASER FIBER SIDE FIRE SU 550UM (MISCELLANEOUS) ×3 IMPLANT
MANIFOLD NEPTUNE II (INSTRUMENTS) ×3 IMPLANT
PACK CYSTO (CUSTOM PROCEDURE TRAY) ×3 IMPLANT
SHEATH URETERAL 12FRX28CM (UROLOGICAL SUPPLIES) ×3 IMPLANT
SHEATH URETERAL 12FRX35CM (MISCELLANEOUS) IMPLANT
STENT POLARIS 5FRX22 (STENTS) ×3 IMPLANT
SYR CONTROL 10ML LL (SYRINGE) ×3 IMPLANT
TUBE FEEDING 8FR 16IN STR KANG (MISCELLANEOUS) ×3 IMPLANT
TUBING CONNECTING 10 (TUBING) ×2 IMPLANT
TUBING CONNECTING 10' (TUBING) ×1

## 2017-12-09 NOTE — Progress Notes (Signed)
Per Dr Berneice Heinrich : No need to void prior to discharge

## 2017-12-09 NOTE — Brief Op Note (Signed)
12/09/2017  2:44 PM  PATIENT:  Jacqueline Norman  47 y.o. female  PRE-OPERATIVE DIAGNOSIS:  LEFT RENAL STONE, SOLITARY KIDNEY  POST-OPERATIVE DIAGNOSIS:  LEFT RENAL STONE, SOLITARY KIDNEY  PROCEDURE:  Procedure(s) with comments: CYSTOSCOPY WITH RETROGRADE PYELOGRAM, URETEROSCOPY AND STENT PLACEMENT (Left) - 90 MINS HOLMIUM LASER APPLICATION (Left)  SURGEON:  Surgeon(s) and Role:    Sebastian Ache, MD - Primary  PHYSICIAN ASSISTANT:   ASSISTANTS: none   ANESTHESIA:   general  EBL:  minimal   BLOOD ADMINISTERED:none  DRAINS: none   LOCAL MEDICATIONS USED:  NONE  SPECIMEN:  Source of Specimen:  left ureteral / renal stone fragments  DISPOSITION OF SPECIMEN:  discard  COUNTS:  YES  TOURNIQUET:  * No tourniquets in log *  DICTATION: .Other Dictation: Dictation Number [id number]  PLAN OF CARE: Discharge to home after PACU  PATIENT DISPOSITION:  PACU - hemodynamically stable.   Delay start of Pharmacological VTE agent (>24hrs) due to surgical blood loss or risk of bleeding: not applicable

## 2017-12-09 NOTE — Anesthesia Preprocedure Evaluation (Addendum)
Anesthesia Evaluation  Patient identified by MRN, date of birth, ID band Patient awake    Reviewed: Allergy & Precautions, NPO status , Patient's Chart, lab work & pertinent test results  History of Anesthesia Complications Negative for: history of anesthetic complications  Airway Mallampati: II  TM Distance: >3 FB Neck ROM: Full    Dental  (+) Dental Advisory Given, Missing, Chipped   Pulmonary former smoker (quit 1999),    breath sounds clear to auscultation       Cardiovascular hypertension, Pt. on medications (-) angina Rhythm:Regular Rate:Normal  '16 ECHO: EF 55%, valves OK   Neuro/Psych MS: bedridden, incontinent, unable to swallow well     GI/Hepatic negative GI ROS, Neg liver ROS,   Endo/Other  Hypothyroidism   Renal/GU ARFRenal disease (stones)     Musculoskeletal   Abdominal   Peds  Hematology negative hematology ROS (+)   Anesthesia Other Findings   Reproductive/Obstetrics                            Anesthesia Physical Anesthesia Plan  ASA: III  Anesthesia Plan: General   Post-op Pain Management:    Induction: Intravenous  PONV Risk Score and Plan: 3 and Ondansetron and Dexamethasone  Airway Management Planned: LMA  Additional Equipment:   Intra-op Plan:   Post-operative Plan:   Informed Consent: I have reviewed the patients History and Physical, chart, labs and discussed the procedure including the risks, benefits and alternatives for the proposed anesthesia with the patient or authorized representative who has indicated his/her understanding and acceptance.   Dental advisory given  Plan Discussed with: CRNA and Surgeon  Anesthesia Plan Comments:        Anesthesia Quick Evaluation

## 2017-12-09 NOTE — H&P (Signed)
Jacqueline Norman is an 47 y.o. female.    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 Most recent Cr <1.5.   2 - Chronic Bacteruria- many episodeds cystitis, several febrile UTI's. Most recent UCX 2019 klebsiella RES amp, but sens to others. Received Rocepin last month.   3 - Urolithiasis - s/p left PCNL 09/2014 for large volume left renal stone in solitary kidney. F/u nephrostogram confirms no residual filling defects.    Recent Surveillance:   04/2015 KUB stone free, 10/2015 CT 3mm extreme left lower pole (likely papillary tip), no hydro  11/2016 KUB - no visualized stones.  11/2017 - 12mm UPJ sotne with mod hydro + LLP 3mm stone by CT, Cr 0.8. Left 6x22 stent 11/16/17.    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 Her PCP is Jacqueline Norman with home [facility]. Her neurologist is Dr. Anne Hahn.    Today Amanada is seen to proceed with LEFT ureteroscopy with goal of stone free for stones in solitary kidney. NO interval fevers.    Past Medical History:  Diagnosis Date  . Anemia   . Hypothyroidism   . MS (multiple sclerosis) (HCC)    BED BOUND -UP IN W/C USING HOYER LIFT- UNABLE TO MOVE ARMS AND LEGS - NOT ABLE TO FEED SELF- NO TROUBLE SWALLOWING - STATES SHE EATS WELL; INCONTINENT - WEARS DEPENDS  . Perinephric abscess    HOSPITALIZED 07/29/13 TO 08/05/13.WITH SEPSIS  . Xanthogranulomatous pyelonephritis    RIGHT     Past Surgical History:  Procedure Laterality Date  . CYSTOSCOPY W/ URETERAL STENT PLACEMENT Left 11/16/2017   Procedure: CYSTOSCOPY WITH RETROGRADE PYELOGRAM/URETERAL STENT PLACEMENT;  Surgeon: Sebastian Ache, MD;  Location: [facility];  Service: Urology;  Laterality: Left;  . NEPHROLITHOTOMY Left 09/13/2014   Procedure: NEPHROLITHOTOMY PERCUTANEOUS;  Surgeon: Sebastian Ache, MD;   Location: [facility];  Service: Urology;  Laterality: Left;  . NO PAST SURGERIES    . ROBOT ASSISTED LAPAROSCOPIC NEPHRECTOMY Right 10/05/2013   Procedure: ROBOTIC ASSISTED LAPAROSCOPIC RADICAL NEPHRECTOMY;  Surgeon: Sebastian Ache, MD;  Location: [facility];  Service: Urology;  Laterality: Right;    History reviewed. No pertinent family history. Social History:  reports that she quit smoking about 20 years ago. She has never used smokeless tobacco. She reports that she does not drink alcohol or use drugs.  Allergies: No Known Allergies  No medications prior to admission.    No results found for this or any previous visit (from the past 48 hour(s)). No results found.  Review of Systems  Constitutional: Negative.  Negative for chills and fever.  HENT: Negative.   Eyes: Negative.   Respiratory: Negative.   Cardiovascular: Negative.   Gastrointestinal: Negative.   Genitourinary: Negative.   Musculoskeletal: Negative.   Skin: Negative.   Neurological: Negative.   Endo/Heme/Allergies: Negative.   Psychiatric/Behavioral: Negative.     There were no vitals taken for this visit. Physical Exam  Constitutional: She appears well-developed.  Stable stigmata of paraplegia. Mother with her at bedside.   Eyes: Pupils are equal, round, and reactive to light.  Neck: Normal range of motion.  Cardiovascular: Normal rate.  Respiratory: Effort normal.  GI: Soft.  Genitourinary:  Genitourinary Comments: No CVAT at present.   Musculoskeletal: Normal range of motion.  Neurological: She is alert.  Skin: Skin is warm.  Assessment/Plan  Proceed as planned with LEFT ureteroscopic stone manipulation with goal of stone free. Risks, benefits, alternatives, expected peri-op course discussed previously and reiterated today.   Sebastian Ache, MD 12/09/2017, 7:38 AM

## 2017-12-09 NOTE — Anesthesia Postprocedure Evaluation (Signed)
Anesthesia Post Note  Patient: Jacqueline Norman  Procedure(s) Performed: CYSTOSCOPY WITH RETROGRADE PYELOGRAM, URETEROSCOPY AND STENT EXCHANGE (Left Ureter) HOLMIUM LASER APPLICATION (Left Ureter)     Patient location during evaluation: PACU Anesthesia Type: General Level of consciousness: awake and alert, oriented and patient cooperative Pain management: pain level controlled Vital Signs Assessment: post-procedure vital signs reviewed and stable Respiratory status: spontaneous breathing, nonlabored ventilation and respiratory function stable Cardiovascular status: blood pressure returned to baseline and stable Postop Assessment: no apparent nausea or vomiting Anesthetic complications: no    Last Vitals:  Vitals:   12/09/17 1600 12/09/17 1615  BP: 135/86   Pulse: (!) 125 (!) 115  Resp:    Temp:    SpO2: 100% 99%    Last Pain:  Vitals:   12/09/17 1525  TempSrc:   PainSc: 0-No pain                 ,E. 

## 2017-12-09 NOTE — Transfer of Care (Signed)
Immediate Anesthesia Transfer of Care Note  Patient: Jacqueline Norman  Procedure(s) Performed: CYSTOSCOPY WITH RETROGRADE PYELOGRAM, URETEROSCOPY AND STENT EXCHANGE (Left Ureter) HOLMIUM LASER APPLICATION (Left Ureter)  Patient Location: PACU  Anesthesia Type:General  Level of Consciousness: awake, alert  and oriented  Airway & Oxygen Therapy: Patient Spontanous Breathing and Patient connected to face mask oxygen  Post-op Assessment: Report given to RN and Post -op Vital signs reviewed and stable  Post vital signs: Reviewed and stable  Last Vitals:  Vitals Value Taken Time  BP    Temp    Pulse    Resp    SpO2      Last Pain:  Vitals:   12/09/17 1104  TempSrc: Oral         Complications: No apparent anesthesia complications

## 2017-12-09 NOTE — Discharge Instructions (Signed)
1 - You may have urinary urgency (bladder spasms) and bloody urine on / off with stent in place. This is normal.  2 - Remove tethered stent on Monday morning at home by pulling on string, then blue/white plastic tubing and discarding. Office is open Monday in any issues arise.   3 - Call MD or go to ER for fever >102, severe pain / nausea / vomiting not relieved by medications, or acute change in medical status

## 2017-12-09 NOTE — Op Note (Signed)
NAME: ROHINI, CASASANTA MEDICAL RECORD [id number] ACCOUNT [id number] DATE OF BIRTH:September 04, 1970 FACILITY: [facility] LOCATION: [facility] PHYSICIAN: , MD  OPERATIVE REPORT  DATE OF PROCEDURE:  12/09/2017  PREOPERATIVE DIAGNOSIS:  Left ureteral stone, solitary kidney.  PROCEDURE: 1.  Cystoscopy, left retrograde pyelogram, interpretation. 2.  Left ureteroscopy with laser lithotripsy. 3.  Exchange of left ureteral stent, proximal end renal pelvis, distal end in urinary bladder.  INDICATIONS:  The patient is a pleasant, but quite unfortunate 47 year old lady with history of advanced neurologic disease with functional paraplegia.  She has a solitary left kidney and recurrent stone disease.  She was found on surveillance imaging of  stone disease to have a left proximal ureteral stone and solitary kidney.  She underwent temporizing as she does have a history of bacteriuria with this stent last month.  She was given culture specific antibiotics.  She now presents for definitive  stone management of left large proximal ureteral stone with the goal of stone free.  Informed consent was obtained and placed in medical record.  DESCRIPTION OF PROCEDURE:  The patient being verified as Eusebio Me, the procedure being left ureteroscopic stimulation was confirmed.  Procedure timeout was performed.  Intravenous antibiotics administered.  General LMA anesthesia induced.  The  patient was placed into a low lithotomy position and sterile field was created by prepping and draping the patient's vagina, introitus and proximal thighs using iodine.  Cystourethroscopy was performed using a 22-French rigid cystoscope with offset lens.   Inspection of bladder revealed no diverticula, calcifications or lesions.  The distal end of left ureteral stent was seen in situ.  It was grasped, brought to the level of the urethral meatus through which 0.038 ZIPwire was advanced to lower pole and  exchanged for an  open-ended catheter and left retrograde pyelogram was obtained.  Left ventriculogram showed a single left ureter with single system left kidney.  There was a filling defect and radiodensity in the left upper midureter consistent with known stone.  A .03 ZIPwire was once again advanced and set aside as a safety wire  and the upper pole.  An 8-French feeding tube was placed in the urinary bladder for pressure release, and semirigid ureteroscopy was performed of the distal left ureter alongside a separate sensor working wire.  At the upper reaches of the ureteroscope,  a dominant fusiform calcification was encountered.  It was much too large for simple basketing.  Given the lack of reach in this location, it was felt that approach using a sheath and flexible ureteroscope would be most advantageous.  As such, the  semirigid scope was exchanged for a 12/14 shorter sling, ureteral access sheath to the level of the mid ureter.  Using continuous fluoroscopic guidance, taking exquisite care to verify that sheath was not passed proximal to the level of the stone and  flexible digital ureteroscopy performed OM the proximal left ureter.  The stone in the proximal ureter was once again visualized.  It was much too large for simple basketing.  Holmium laser energy was then applied 70 setting of 0.2 joules and 20 Hz, and  it was fragmented into approximately 6 smaller pieces that were then sequentially grasped with the long axis of the escape basket removed and set aside.  Flexible ureteroscopy was then performed of the proximal left ureter and systematic inspection of  the left kidney, including all calices x3.  There were several small lower pole calcifications that were amenable to basketing.  They were also removed.  Residual stone in the left lower pole was punctate.  It was ablated using a popcorn technique with  settings of 2 joules and 10 Hz.  Following this, there was complete resolution of all accessible stone  fragments larger than one-third millimeter with excellent hemostasis.  No evidence of any renal perforation.  The access sheath was removed under  continuous vision.  There was mild mucosal edema at the site of prior stone impaction.  Given a solitary kidney,  it was felt that brief interval stenting would be warranted with a tethered stent.  As such, a new 5 x 22 Polaris-type stent was placed by  the safety wire using fluoroscopic guidance.  Good proximal and distal plane were noted.  Tether was left in place fashioned to the mons pubis and the procedure terminated.    The patient tolerated the procedure well.  No immediate perioperative complications.  The patient taken to Post Anesthesia Care Unit in stable condition.  AN/NUANCE  D:12/09/2017 T:12/09/2017 JOB:[id number]

## 2017-12-09 NOTE — Anesthesia Procedure Notes (Signed)
Procedure Name: LMA Insertion Date/Time: 12/09/2017 1:46 PM Performed by: Shanon Payor, CRNA Pre-anesthesia Checklist: Patient identified, Emergency Drugs available, Suction available, Patient being monitored and Timeout performed Patient Re-evaluated:Patient Re-evaluated prior to induction Oxygen Delivery Method: Circle system utilized Preoxygenation: Pre-oxygenation with 100% oxygen Induction Type: IV induction LMA: LMA inserted LMA Size: 4.0 Number of attempts: 1 Placement Confirmation: positive ETCO2 and breath sounds checked- equal and bilateral Tube secured with: Tape Dental Injury: Teeth and Oropharynx as per pre-operative assessment

## 2017-12-10 ENCOUNTER — Encounter (HOSPITAL_COMMUNITY): Payer: Self-pay | Admitting: Urology

## 2021-12-08 LAB — COLOGUARD

## 2022-08-17 ENCOUNTER — Other Ambulatory Visit: Payer: Self-pay | Admitting: Urology

## 2022-09-08 NOTE — Progress Notes (Addendum)
COVID Vaccine Completed:  Date of COVID positive in last 90 days:  N/A  PCP - Lytle Butte, NP Cardiologist -   Chest x-ray - N/A EKG - 09-10-22 Epic Stress Test - N/A ECHO - 08-06-14 Epic Cardiac Cath - N/A Pacemaker/ICD device last checked: Spinal Cord Stimulator:N/A  Bowel Prep - N/A  Sleep Study - N/A CPAP -   CBG 284 at PAT Fasting Blood Sugar - 200 range Checks Blood Sugar - checks occaisionally  Last dose of GLP1 agonist-  N/A GLP1 instructions:  N/A   Last dose of SGLT-2 inhibitors-  N/A SGLT-2 instructions: N/A   Blood Thinner Instructions:  N/A Aspirin Instructions: Last Dose:  Activity level:   Patient no longer able to walk due to MS.      Anesthesia review:  MS (confined to bed), HTN, DM  CBG 284 at PAT, patient had not taken her meds prior to PAT appointment.  A1c 8.7 on PAT labs  Patient denies shortness of breath, fever, cough and chest pain at PAT appointment  Patient verbalized understanding of instructions that were given to them at the PAT appointment. Patient was also instructed that they will need to review over the PAT instructions again at home before surgery.

## 2022-09-08 NOTE — Patient Instructions (Addendum)
SURGICAL WAITING ROOM VISITATION Patients having surgery or a procedure may have no more than 2 support people in the waiting area - these visitors may rotate.    Children under the age of 51 must have an adult with them who is not the patient.  If the patient needs to stay at the hospital during part of their recovery, the visitor guidelines for inpatient rooms apply. Pre-op nurse will coordinate an appropriate time for 1 support person to accompany patient in pre-op.  This support person may not rotate.    Please refer to the [facility] website for the visitor guidelines for Inpatients (after your surgery is over and you are in a regular room).       Your procedure is scheduled on: 09-17-22   Report to [facility] Main Entrance    Report to admitting at 12:15 PM   Call this number if you have problems the morning of surgery [phone]   Do not eat food or drink liquids:After Midnight.           If you have questions, please contact your surgeon's office.   FOLLOW ANY ADDITIONAL PRE OP INSTRUCTIONS YOU RECEIVED FROM YOUR SURGEON'S OFFICE!!!     Oral Hygiene is also important to reduce your risk of infection.                                    Remember - BRUSH YOUR TEETH THE MORNING OF SURGERY WITH YOUR REGULAR TOOTHPASTE   Do NOT smoke after Midnight   Take these medicines the morning of surgery with A SIP OF WATER:   Levothyroxine   How to Manage Your Diabetes Before and After Surgery  Why is it important to control my blood sugar before and after surgery? Improving blood sugar levels before and after surgery helps healing and can limit problems. A way of improving blood sugar control is eating a healthy diet by:  Eating less sugar and carbohydrates  Increasing activity/exercise  Talking with your doctor about reaching your blood sugar goals High blood sugars (greater than 180 mg/dL) can raise your risk of infections and slow your recovery, so you will need to  focus on controlling your diabetes during the weeks before surgery. Make sure that the doctor who takes care of your diabetes knows about your planned surgery including the date and location.  How do I manage my blood sugar before surgery? Check your blood sugar at least 4 times a day, starting 2 days before surgery, to make sure that the level is not too high or low. Check your blood sugar the morning of your surgery when you wake up and every 2 hours until you get to the Short Stay unit. If your blood sugar is less than 70 mg/dL, you will need to treat for low blood sugar: Do not take insulin. Treat a low blood sugar (less than 70 mg/dL) with  cup of clear juice (cranberry or apple), 4 glucose tablets, OR glucose gel. Recheck blood sugar in 15 minutes after treatment (to make sure it is greater than 70 mg/dL). If your blood sugar is not greater than 70 mg/dL on recheck, call [phone] for further instructions. Report your blood sugar to the short stay nurse when you get to Short Stay.  If you are admitted to the hospital after surgery: Your blood sugar will be checked by the staff and you will probably be  given insulin after surgery (instead of oral diabetes medicines) to make sure you have good blood sugar levels. The goal for blood sugar control after surgery is 80-180 mg/dL.   WHAT DO I DO ABOUT MY DIABETES MEDICATION?  Do not take oral diabetes medicines (pills) the morning of surgery.  DO NOT TAKE THE FOLLOWING 7 DAYS PRIOR TO SURGERY: Ozempic, Wegovy, Rybelsus (Semaglutide), Byetta (exenatide), Bydureon (exenatide ER), Victoza, Saxenda (liraglutide), or Trulicity (dulaglutide) Mounjaro (Tirzepatide) Adlyxin (Lixisenatide), Polyethylene Glycol Loxenatide.  Reviewed and Endorsed by [facility] Patient Education Committee, August 2015 Stop all vitamins and herbal supplements 7 days before surgery                              You may not have any metal on your body including hair  pins, jewelry, and body piercing             Do not wear make-up, lotions, powders, perfumes or deodorant  Do not wear nail polish including gel and S&S, artificial/acrylic nails, or any other type of covering on natural nails including finger and toenails. If you have artificial nails, gel coating, etc. that needs to be removed by a nail salon please have this removed prior to surgery or surgery may need to be canceled/ delayed if the surgeon/ anesthesia feels like they are unable to be safely monitored.   Do not shave  48 hours prior to surgery.         Do not bring valuables to the hospital. [facility] IS NOT RESPONSIBLE   FOR VALUABLES.   Contacts, dentures or bridgework may not be worn into surgery.  DO NOT BRING YOUR HOME MEDICATIONS TO THE HOSPITAL. PHARMACY WILL DISPENSE MEDICATIONS LISTED ON YOUR MEDICATION LIST TO YOU DURING YOUR ADMISSION IN THE HOSPITAL!    Patients discharged on the day of surgery will not be allowed to drive home.  Someone NEEDS to stay with you for the first 24 hours after anesthesia.   Special Instructions: Bring a copy of your healthcare power of attorney and living will documents the day of surgery if you haven't scanned them before.              Please read over the following fact sheets you were given: IF YOU HAVE QUESTIONS ABOUT YOUR PRE-OP INSTRUCTIONS PLEASE CALL [phone] G[facility]  If you received a COVID test during your pre-op visit  it is requested that you wear a mask when out in public, stay away from anyone that may not be feeling well and notify your surgeon if you develop symptoms. If you test positive for Covid or have been in contact with anyone that has tested positive in the last 10 days please notify you surgeon.  [facility] - Preparing for Surgery Before surgery, you can play an important role.  Because skin is not sterile, your skin needs to be as free of germs as possible.  You can reduce the number of germs on your skin by washing  with CHG (chlorahexidine gluconate) soap before surgery.  CHG is an antiseptic cleaner which kills germs and bonds with the skin to continue killing germs even after washing. Please DO NOT use if you have an allergy to CHG or antibacterial soaps.  If your skin becomes reddened/irritated stop using the CHG and inform your nurse when you arrive at Short Stay. Do not shave (including legs and underarms) for at least 48 hours prior to  the first CHG shower.  You may shave your face/neck.  Please follow these instructions carefully:  1.  Shower with CHG Soap the night before surgery and the  morning of surgery.  2.  If you choose to wash your hair, wash your hair first as usual with your normal  shampoo.  3.  After you shampoo, rinse your hair and body thoroughly to remove the shampoo.                             4.  Use CHG as you would any other liquid soap.  You can apply chg directly to the skin and wash.  Gently with a scrungie or clean washcloth.  5.  Apply the CHG Soap to your body ONLY FROM THE NECK DOWN.   Do   not use on face/ open                           Wound or open sores. Avoid contact with eyes, ears mouth and   genitals (private parts).                       Wash face,  Genitals (private parts) with your normal soap.             6.  Wash thoroughly, paying special attention to the area where your    surgery  will be performed.  7.  Thoroughly rinse your body with warm water from the neck down.  8.  DO NOT shower/wash with your normal soap after using and rinsing off the CHG Soap.                9.  Pat yourself dry with a clean towel.            10.  Wear clean pajamas.            11.  Place clean sheets on your bed the night of your first shower and do not  sleep with pets. Day of Surgery : Do not apply any lotions/deodorants the morning of surgery.  Please wear clean clothes to the hospital/surgery center.  FAILURE TO FOLLOW THESE INSTRUCTIONS MAY RESULT IN THE CANCELLATION OF YOUR  SURGERY  PATIENT SIGNATURE_________________________________  NURSE SIGNATURE__________________________________  ________________________________________________________________________

## 2022-09-10 ENCOUNTER — Encounter (HOSPITAL_COMMUNITY)
Admission: RE | Admit: 2022-09-10 | Discharge: 2022-09-10 | Disposition: A | Discharge status: Home / Self Care | Payer: 59 | Source: Ambulatory Visit | Attending: Urology | Admitting: Urology

## 2022-09-10 ENCOUNTER — Other Ambulatory Visit: Payer: Self-pay

## 2022-09-10 ENCOUNTER — Encounter (HOSPITAL_COMMUNITY): Payer: Self-pay

## 2022-09-10 VITALS — BP 157/98 | HR 85 | Temp 98.0°F | Resp 16 | Ht 65.0 in | Wt 195.0 lb

## 2022-09-10 DIAGNOSIS — I10 Essential (primary) hypertension: Secondary | ICD-10-CM | POA: Insufficient documentation

## 2022-09-10 DIAGNOSIS — D649 Anemia, unspecified: Secondary | ICD-10-CM | POA: Diagnosis not present

## 2022-09-10 DIAGNOSIS — E119 Type 2 diabetes mellitus without complications: Secondary | ICD-10-CM | POA: Insufficient documentation

## 2022-09-10 DIAGNOSIS — Z01818 Encounter for other preprocedural examination: Secondary | ICD-10-CM | POA: Insufficient documentation

## 2022-09-10 HISTORY — DX: Type 2 diabetes mellitus without complications: E11.9

## 2022-09-10 HISTORY — DX: Essential (primary) hypertension: I10

## 2022-09-10 LAB — CBC
HCT: 45.2 % (ref 36.0–46.0)
Hemoglobin: 14.1 g/dL (ref 12.0–15.0)
MCH: 29 pg (ref 26.0–34.0)
MCHC: 31.2 g/dL (ref 30.0–36.0)
MCV: 92.8 fL (ref 80.0–100.0)
Platelets: 190 10*3/uL (ref 150–400)
RBC: 4.87 MIL/uL (ref 3.87–5.11)
RDW: 17.2 % — ABNORMAL HIGH (ref 11.5–15.5)
WBC: 6.7 10*3/uL (ref 4.0–10.5)
nRBC: 0 % (ref 0.0–0.2)

## 2022-09-10 LAB — BASIC METABOLIC PANEL
Anion gap: 11 (ref 5–15)
BUN: 10 mg/dL (ref 6–20)
CO2: 23 mmol/L (ref 22–32)
Calcium: 10.2 mg/dL (ref 8.9–10.3)
Chloride: 105 mmol/L (ref 98–111)
Creatinine, Ser: 0.71 mg/dL (ref 0.44–1.00)
GFR, Estimated: >60 mL/min (ref >60)
Glucose, Bld: 263 mg/dL — ABNORMAL HIGH (ref 70–99)
Potassium: 3.9 mmol/L (ref 3.5–5.1)
Sodium: 139 mmol/L (ref 135–145)

## 2022-09-10 LAB — HEMOGLOBIN A1C
Hgb A1c MFr Bld: 8.7 % — ABNORMAL HIGH (ref 4.8–5.6)
Mean Plasma Glucose: 202.99 mg/dL

## 2022-09-10 LAB — GLUCOSE, CAPILLARY: Glucose-Capillary: 284 mg/dL — ABNORMAL HIGH (ref 70–99)

## 2022-09-17 ENCOUNTER — Encounter (HOSPITAL_COMMUNITY)
Admission: RE | Disposition: A | Discharge status: Home / Self Care | Payer: Self-pay | Source: Home / Self Care | Attending: Urology

## 2022-09-17 ENCOUNTER — Other Ambulatory Visit: Payer: Self-pay

## 2022-09-17 ENCOUNTER — Ambulatory Visit (HOSPITAL_COMMUNITY): Payer: 59 | Admitting: Anesthesiology

## 2022-09-17 ENCOUNTER — Ambulatory Visit (HOSPITAL_BASED_OUTPATIENT_CLINIC_OR_DEPARTMENT_OTHER): Payer: 59 | Admitting: Anesthesiology

## 2022-09-17 ENCOUNTER — Ambulatory Visit (HOSPITAL_COMMUNITY)
Admission: RE | Admit: 2022-09-17 | Discharge: 2022-09-17 | Disposition: A | Discharge status: Home / Self Care | Payer: 59 | Attending: Urology | Admitting: Urology

## 2022-09-17 ENCOUNTER — Encounter (HOSPITAL_COMMUNITY): Payer: Self-pay | Admitting: Urology

## 2022-09-17 ENCOUNTER — Ambulatory Visit (HOSPITAL_COMMUNITY): Payer: 59

## 2022-09-17 DIAGNOSIS — Z79899 Other long term (current) drug therapy: Secondary | ICD-10-CM | POA: Insufficient documentation

## 2022-09-17 DIAGNOSIS — Z905 Acquired absence of kidney: Secondary | ICD-10-CM | POA: Diagnosis not present

## 2022-09-17 DIAGNOSIS — Z6832 Body mass index (BMI) 32.0-32.9, adult: Secondary | ICD-10-CM | POA: Insufficient documentation

## 2022-09-17 DIAGNOSIS — N3020 Other chronic cystitis without hematuria: Secondary | ICD-10-CM | POA: Insufficient documentation

## 2022-09-17 DIAGNOSIS — I10 Essential (primary) hypertension: Secondary | ICD-10-CM | POA: Insufficient documentation

## 2022-09-17 DIAGNOSIS — D6489 Other specified anemias: Secondary | ICD-10-CM | POA: Diagnosis not present

## 2022-09-17 DIAGNOSIS — N132 Hydronephrosis with renal and ureteral calculous obstruction: Secondary | ICD-10-CM

## 2022-09-17 DIAGNOSIS — Z7401 Bed confinement status: Secondary | ICD-10-CM | POA: Insufficient documentation

## 2022-09-17 DIAGNOSIS — E669 Obesity, unspecified: Secondary | ICD-10-CM | POA: Insufficient documentation

## 2022-09-17 DIAGNOSIS — R532 Functional quadriplegia: Secondary | ICD-10-CM | POA: Insufficient documentation

## 2022-09-17 DIAGNOSIS — G35 Multiple sclerosis: Secondary | ICD-10-CM | POA: Insufficient documentation

## 2022-09-17 DIAGNOSIS — E039 Hypothyroidism, unspecified: Secondary | ICD-10-CM | POA: Diagnosis not present

## 2022-09-17 DIAGNOSIS — Z87891 Personal history of nicotine dependence: Secondary | ICD-10-CM | POA: Diagnosis not present

## 2022-09-17 DIAGNOSIS — R32 Unspecified urinary incontinence: Secondary | ICD-10-CM | POA: Diagnosis not present

## 2022-09-17 HISTORY — PX: CYSTOSCOPY WITH RETROGRADE PYELOGRAM, URETEROSCOPY AND STENT PLACEMENT: SHX5789

## 2022-09-17 HISTORY — PX: HOLMIUM LASER APPLICATION: SHX5852

## 2022-09-17 LAB — GLUCOSE, CAPILLARY
Glucose-Capillary: 210 mg/dL — ABNORMAL HIGH (ref 70–99)
Glucose-Capillary: 209 mg/dL — ABNORMAL HIGH (ref 70–99)

## 2022-09-17 LAB — HCG, SERUM, QUALITATIVE: Preg, Serum: NEGATIVE

## 2022-09-17 SURGERY — CYSTOURETEROSCOPY, WITH RETROGRADE PYELOGRAM AND STENT INSERTION
Anesthesia: General | Laterality: Left

## 2022-09-17 MED ORDER — CHLORHEXIDINE GLUCONATE 0.12 % MT SOLN
15.0000 mL | Freq: Once | OROMUCOSAL | Status: AC
  Administered 2022-09-17: 15 mL via OROMUCOSAL

## 2022-09-17 MED ORDER — MIDAZOLAM HCL 2 MG/2ML IJ SOLN
INTRAMUSCULAR | Status: AC
  Filled 2022-09-17: qty 2

## 2022-09-17 MED ORDER — ACETAMINOPHEN 500 MG PO TABS: 1000.0000 mg | ORAL_TABLET | Freq: Once | ORAL | Status: DC

## 2022-09-17 MED ORDER — PROPOFOL 10 MG/ML IV BOLUS
INTRAVENOUS | Status: AC
  Filled 2022-09-17: qty 20

## 2022-09-17 MED ORDER — AMISULPRIDE (ANTIEMETIC) 5 MG/2ML IV SOLN: 10.0000 mg | Freq: Once | INTRAVENOUS | Status: DC | PRN

## 2022-09-17 MED ORDER — ONDANSETRON HCL 4 MG/2ML IJ SOLN
INTRAMUSCULAR | Status: AC
  Filled 2022-09-17: qty 2

## 2022-09-17 MED ORDER — ORAL CARE MOUTH RINSE: 15.0000 mL | Freq: Once | OROMUCOSAL | Status: AC

## 2022-09-17 MED ORDER — IOHEXOL 300 MG/ML  SOLN
INTRAMUSCULAR | Status: DC | PRN
  Administered 2022-09-17: 25 mL

## 2022-09-17 MED ORDER — FENTANYL CITRATE PF 50 MCG/ML IJ SOSY: 25.0000 ug | PREFILLED_SYRINGE | INTRAMUSCULAR | Status: DC | PRN

## 2022-09-17 MED ORDER — DEXAMETHASONE SODIUM PHOSPHATE 10 MG/ML IJ SOLN
INTRAMUSCULAR | Status: DC | PRN
  Administered 2022-09-17: 10 mg via INTRAVENOUS

## 2022-09-17 MED ORDER — GENTAMICIN SULFATE 40 MG/ML IJ SOLN: 5.0000 mg/kg | INTRAVENOUS | Status: DC

## 2022-09-17 MED ORDER — PROPOFOL 10 MG/ML IV BOLUS
INTRAVENOUS | Status: DC | PRN
  Administered 2022-09-17: 160 mg via INTRAVENOUS

## 2022-09-17 MED ORDER — FENTANYL CITRATE (PF) 100 MCG/2ML IJ SOLN
INTRAMUSCULAR | Status: AC
  Filled 2022-09-17: qty 2

## 2022-09-17 MED ORDER — FENTANYL CITRATE (PF) 100 MCG/2ML IJ SOLN
INTRAMUSCULAR | Status: DC | PRN
  Administered 2022-09-17: 25 ug via INTRAVENOUS

## 2022-09-17 MED ORDER — HYDROCODONE-ACETAMINOPHEN 5-325 MG PO TABS: 1.0000 | ORAL_TABLET | Freq: Four times a day (QID) | ORAL | 0 refills | Status: DC | PRN

## 2022-09-17 MED ORDER — ACETAMINOPHEN 10 MG/ML IV SOLN
INTRAVENOUS | Status: AC
  Filled 2022-09-17: qty 100

## 2022-09-17 MED ORDER — GENTAMICIN SULFATE 40 MG/ML IJ SOLN
5.0000 mg/kg | INTRAVENOUS | Status: AC
  Administered 2022-09-17: 350 mg via INTRAVENOUS
  Filled 2022-09-17: qty 8.75

## 2022-09-17 MED ORDER — LIDOCAINE 2% (20 MG/ML) 5 ML SYRINGE
INTRAMUSCULAR | Status: DC | PRN
  Administered 2022-09-17: 80 mg via INTRAVENOUS

## 2022-09-17 MED ORDER — ONDANSETRON HCL 4 MG/2ML IJ SOLN
INTRAMUSCULAR | Status: DC | PRN
  Administered 2022-09-17: 4 mg via INTRAVENOUS

## 2022-09-17 MED ORDER — LACTATED RINGERS IV SOLN: INTRAVENOUS | Status: DC

## 2022-09-17 MED ORDER — PHENYLEPHRINE 80 MCG/ML (10ML) SYRINGE FOR IV PUSH (FOR BLOOD PRESSURE SUPPORT)
PREFILLED_SYRINGE | INTRAVENOUS | Status: DC | PRN
  Administered 2022-09-17: 80 ug via INTRAVENOUS
  Administered 2022-09-17: 160 ug via INTRAVENOUS

## 2022-09-17 MED ORDER — MIDAZOLAM HCL 2 MG/2ML IJ SOLN
INTRAMUSCULAR | Status: DC | PRN
  Administered 2022-09-17: 2 mg via INTRAVENOUS

## 2022-09-17 MED ORDER — ACETAMINOPHEN 10 MG/ML IV SOLN
INTRAVENOUS | Status: DC | PRN
  Administered 2022-09-17: 1000 mg via INTRAVENOUS

## 2022-09-17 MED ORDER — LACTATED RINGERS IV SOLN: INTRAVENOUS | Status: DC | PRN

## 2022-09-17 MED ORDER — DEXAMETHASONE SODIUM PHOSPHATE 10 MG/ML IJ SOLN
INTRAMUSCULAR | Status: AC
  Filled 2022-09-17: qty 1

## 2022-09-17 MED ORDER — SODIUM CHLORIDE 0.9 % IR SOLN
Status: DC | PRN
  Administered 2022-09-17: 6000 mL via INTRAVESICAL

## 2022-09-17 MED ORDER — LIDOCAINE HCL (PF) 2 % IJ SOLN
INTRAMUSCULAR | Status: AC
  Filled 2022-09-17: qty 5

## 2022-09-17 SURGICAL SUPPLY — 25 items
BAG URO CATCHER STRL LF (MISCELLANEOUS) ×1 IMPLANT
BASKET LASER NITINOL 1.9FR (BASKET) IMPLANT
BSKT STON RTRVL 120 1.9FR (BASKET)
CATH URETL OPEN END 6FR 70 (CATHETERS) ×1 IMPLANT
CLOTH BEACON ORANGE TIMEOUT ST (SAFETY) ×1 IMPLANT
EXTRACTOR STONE 1.7FRX115CM (UROLOGICAL SUPPLIES) IMPLANT
GLOVE SURG LX STRL 7.5 STRW (GLOVE) ×1 IMPLANT
GOWN STRL REUS W/ TWL XL LVL3 (GOWN DISPOSABLE) ×1 IMPLANT
GOWN STRL REUS W/TWL XL LVL3 (GOWN DISPOSABLE) ×1
GUIDEWIRE ANG ZIPWIRE 038X150 (WIRE) ×1 IMPLANT
GUIDEWIRE STR DUAL SENSOR (WIRE) ×1 IMPLANT
KIT TURNOVER KIT A (KITS) IMPLANT
LASER FIB FLEXIVA PULSE ID 365 (Laser) IMPLANT
LASER FIB FLEXIVA PULSE ID 550 (Laser) IMPLANT
LASER FIB FLEXIVA PULSE ID 910 (Laser) IMPLANT
MANIFOLD NEPTUNE II (INSTRUMENTS) ×1 IMPLANT
PACK CYSTO (CUSTOM PROCEDURE TRAY) ×1 IMPLANT
SHEATH NAVIGATOR HD 11/13X28 (SHEATH) IMPLANT
SHEATH NAVIGATOR HD 11/13X36 (SHEATH) IMPLANT
STENT POLARIS 5FRX24 (STENTS) IMPLANT
TRACTIP FLEXIVA PULS ID 200XHI (Laser) IMPLANT
TRACTIP FLEXIVA PULSE ID 200 (Laser) ×1
TUBE PU 8FR 16IN ENFIT (TUBING) ×1 IMPLANT
TUBING CONNECTING 10 (TUBING) ×1 IMPLANT
TUBING UROLOGY SET (TUBING) ×1 IMPLANT

## 2022-09-17 NOTE — Op Note (Signed)
NAME: Jacqueline Norman, Jacqueline Norman MEDICAL RECORD NO: [id number] ACCOUNT NO: [id number] DATE OF BIRTH: 19-Aug-1970 FACILITY: [facility] LOCATION: [facility] PHYSICIAN: Sebastian Ache, MD  Operative Report   DATE OF PROCEDURE: 09/17/2022  PREOPERATIVE DIAGNOSIS:  Recurrent left renal stone, solitary kidney, history of recurrent urinary tract infections.  PROCEDURE:   1.  Left first stage ureteroscopic stone manipulation. 2.  Retrograde pyelogram and interpretation. 3.  Placement of the left ureteral stent.  FINDINGS:   1.  Recurrent left renal stone approximately 2.5 cm, mild hydronephrosis. 2.  Ablation of estimated 70% of total stone volume with first stage procedure. 3.  Successful placement of left ureteral stent.  SPECIMEN:  Left renal pelvis urine for culture.  INDICATIONS:  The patient is a pleasant, but unfortunate 52 year old lady with history of advanced MS with functional paraplegia, obesity, diabetes, solitary kidney with recurrent urolithiasis.  She was found on followup to have recurrence of stones in  her solitary kidney.  Options were discussed including recommended management of ureteroscopy in a staged fashion given the stone volume and she presents for first stage procedure today.  Most recent urine culture negative.  Informed consent was obtained  and placed in the record.  PROCEDURE IN DETAIL:  The patient being identified and verified.  Procedure being left first stage ureteroscopic stone manipulation was confirmed.  Procedure timeout was performed.  Intravenous antibiotics were administered.  General LMA anesthesia  induced.  The patient was placed into a low lithotomy position.  Sterile field was created, prepped and draped the patient's vagina, introitus, and proximal thighs using iodine.  Cystourethroscopy was performed using 21-French rigid cystoscope with  offset lens.  Inspection of urinary bladder, no diverticula, calcifications or papillary lesions.  The left ureteral orifice  was cannulated with 6-French end-hole catheter, and a left retrograde pyelogram was obtained.  Left retrograde pyelogram demonstrated single left ureter, single system left kidney.  There was a filling defect near the UPJ consistent with stone with mild hydronephrosis, there are multifocal additional non-obstructing renal stones.  Next 0.038 ZIPwire was advanced to  the level of the upper pole, set aside as a safety wire.  An 8-French feeding tube placed in urinary bladder for pressure release.  Next, semirigid ureteroscopy performed of the distal four-fifths of left ureter alongside a separate sensor working wire.   No mucosal abnormalities were found.  Semirigid scope was exchanged for an 11/13 short length ureteral access sheath to the level of the proximal ureter using continuous fluoroscopic guidance and flexible digital ureteroscopy was performed of the  proximal left ureter and systematic inspection of left kidney.  As anticipated there was a dominant stone in the area of the UPJ that did appear to have some ball-valving intermittent obstruction.  This was at least 2 cm.  There is multifocal additional  renal stones in the upper pole and lower pole.  It appeared relatively soft excellent energy applied to stone using setting of 0.2 joules and 20 Hz and using dusting technique each focus of the stone was carefully addressed such that all stone fragments  were nonobstructing and using a popcorn technique at a settings at 1 joule and 10 Hz additional energy applied to each focus of stone and fragmentation of stones to essentially dusted particles.  There was a significant amount of proteinaceous debris  within the left kidney, but no frank purulence, erring to the side of caution a sample of urine was sent for culture in case she develops infectious parameters postoperatively.  Kidney was  copiously irrigated in 50 mL aliquots of normal saline clearing  up all stone dust and proteinaceous debris.  Given  the large volume stone and solitary kidney, it was clearly felt that she would benefit with second stage of procedure in several weeks as planned.  As the goal was to render her completely stone free.   As such, the access sheath was removed under continuous vision, no significant mucosal abnormalities were found and a new 5 x 24 Polaris type stent was carefully placed using fluoroscopic guidance.  Good proximal and distal planes were noted.  Procedure  was terminated.  The patient tolerated procedure well, no immediate perioperative complications.  The patient was taken to postanesthesia care in stable condition.  Plan for discharge home.    PUS D: 09/17/2022 4:01:20 pm T: 09/17/2022 5:49:00 pm  JOB: [id number]/ [id number]

## 2022-09-17 NOTE — H&P (Signed)
Jacqueline Norman is an 52 y.o. female.    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 Most recent Cr <1.5.    2 - Chronic Bacteriuria- many episodeds cystitis, several febrile UTI's. Most recent UCX 07/2015, negative. Previously e. coli sens Vantin. Most recetn UCX 2024 negative.    3 - Large Left Renal Stone - s/p left PCNL 09/2014 for large volume left renal stone in solitary kidney. F/u nephrostogram confirms no residual filling defects.   Recent Surveillance:  11/2017 - Ureteroscopy to stone free for 12mm UPJ stone.  05/2020 - CT - stone free.  07/2022 - [facility], Renal US - multiple renal stoens, approx 2.5cm total.    PMH sig for MS with functional quadraplegia, obesity, chornic anemia (Hgb 7-8 range previously, now improved ). Her PCP is Jacqueline Norman with home care [facility]. Her neurologist is Dr. Anne Hahn.    Today [name] is seen to proceed with LEFT 1st stage ureteroscopy for recurrent stones in solitary kidney. Most recent UCX negative. A1c 8.7, Hgb 14.   Past Medical History:  Diagnosis Date   Anemia    Diabetes mellitus without complication (HCC)    Hypertension    Hypothyroidism    MS (multiple sclerosis) (HCC)    BED BOUND -UP IN W/C USING HOYER LIFT- UNABLE TO MOVE ARMS AND LEGS - NOT ABLE TO FEED SELF- NO TROUBLE SWALLOWING - STATES SHE EATS WELL; INCONTINENT - WEARS DEPENDS   Perinephric abscess    HOSPITALIZED 07/29/13 TO 08/05/13.WITH SEPSIS   Xanthogranulomatous pyelonephritis    RIGHT     Past Surgical History:  Procedure Laterality Date   CYSTOSCOPY W/ URETERAL STENT PLACEMENT Left 11/16/2017   Procedure: CYSTOSCOPY WITH RETROGRADE PYELOGRAM/URETERAL STENT PLACEMENT;  Surgeon: Sebastian Ache, MD;  Location: [facility];  Service: Urology;  Laterality: Left;   CYSTOSCOPY WITH RETROGRADE PYELOGRAM, URETEROSCOPY AND  STENT PLACEMENT Left 12/09/2017   Procedure: CYSTOSCOPY WITH RETROGRADE PYELOGRAM, URETEROSCOPY AND STENT EXCHANGE;  Surgeon: Sebastian Ache, MD;  Location: [facility];  Service: Urology;  Laterality: Left;  90 MINS   HOLMIUM LASER APPLICATION Left 12/09/2017   Procedure: HOLMIUM LASER APPLICATION;  Surgeon: Sebastian Ache, MD;  Location: [facility];  Service: Urology;  Laterality: Left;   NEPHROLITHOTOMY Left 09/13/2014   Procedure: NEPHROLITHOTOMY PERCUTANEOUS;  Surgeon: Sebastian Ache, MD;  Location: [facility];  Service: Urology;  Laterality: Left;   NO PAST SURGERIES     ROBOT ASSISTED LAPAROSCOPIC NEPHRECTOMY Right 10/05/2013   Procedure: ROBOTIC ASSISTED LAPAROSCOPIC RADICAL NEPHRECTOMY;  Surgeon: Sebastian Ache, MD;  Location: [facility];  Service: Urology;  Laterality: Right;    No family history on file. Social History:  reports that she quit smoking about 25 years ago. Her smoking use included cigarettes. She has never used smokeless tobacco. She reports that she does not drink alcohol and does not use drugs.  Allergies: No Known Allergies  No medications prior to admission.    No results found for this or any previous visit (from the past 48 hour(s)). No results found.  Review of Systems  Constitutional:  Negative for chills and fever.  All other systems reviewed and are negative.   There were no vitals taken for this visit. Physical Exam Vitals reviewed.  Constitutional:      Comments: Stigmata of advanced neurologic disease. Cooperative and at baseline.   HENT:     Head:  Normocephalic.  Eyes:     Pupils: Pupils are equal, round, and reactive to light.  Cardiovascular:     Rate and Rhythm: Normal rate.  Pulmonary:     Effort: Pulmonary effort is normal.  Abdominal:     Comments: Very large truncal obesity, stable.   Genitourinary:    Comments: No cVAT at presnt Musculoskeletal:     Cervical back: Normal range of motion.     Comments: Non-ambulatory., LE wasting  Psychiatric:         Mood and Affect: Mood normal.      Assessment/Plan  Proceed as planned with LEFT 1st stage ureteroscopy with goal of sotne free in solitary kidney. Risks, benefits, alternatives, expected peri-op course discussed previously and reiterated today. Pt and her mother understand that her metabolic and neurologic comorbidity increases risk of ALL peri-op complications including mortality.   Loletta Parish., MD 09/17/2022, 7:04 AM

## 2022-09-17 NOTE — Discharge Instructions (Signed)
1 - You may have urinary urgency (bladder spasms) and bloody urine on / off with stent in place. This is normal. ° °2 - Call MD or go to ER for fever >102, severe pain / nausea / vomiting not relieved by medications, or acute change in medical status ° °

## 2022-09-17 NOTE — Anesthesia Procedure Notes (Addendum)
Procedure Name: LMA Insertion Date/Time: 09/17/2022 2:52 PM  Performed by: Florene Route, CRNAPatient Re-evaluated:Patient Re-evaluated prior to induction Oxygen Delivery Method: Circle system utilized Preoxygenation: Pre-oxygenation with 100% oxygen Induction Type: IV induction LMA: LMA inserted LMA Size: 4.0 Number of attempts: 1 Placement Confirmation: positive ETCO2 and breath sounds checked- equal and bilateral

## 2022-09-17 NOTE — Anesthesia Postprocedure Evaluation (Signed)
Anesthesia Post Note  Patient: Jacqueline Norman  Procedure(s) Performed: FIRST STAGE CYSTOSCOPY WITH RETROGRADE PYELOGRAM, URETEROSCOPY AND STENT PLACEMENT (Left) HOLMIUM LASER APPLICATION (Left)     Patient location during evaluation: PACU Anesthesia Type: General Level of consciousness: sedated and patient cooperative Pain management: pain level controlled Vital Signs Assessment: post-procedure vital signs reviewed and stable Respiratory status: spontaneous breathing Cardiovascular status: stable Anesthetic complications: no   No notable events documented.  Last Vitals:  Vitals:   09/17/22 1615 09/17/22 1630  BP: (!) 142/88 (!) 148/90  Pulse: 83 81  Resp: 15 16  Temp:  36.5 C  SpO2: 93% 92%    Last Pain:  Vitals:   09/17/22 1630  PainSc: 0-No pain                 [name]

## 2022-09-17 NOTE — Anesthesia Preprocedure Evaluation (Addendum)
Anesthesia Evaluation  Patient identified by MRN, date of birth, ID band Patient awake    Reviewed: Allergy & Precautions, NPO status , Patient's Chart, lab work & pertinent test results  History of Anesthesia Complications Negative for: history of anesthetic complications  Airway Mallampati: III  TM Distance: >3 FB Neck ROM: Full    Dental no notable dental hx. (+) Dental Advisory Given, Teeth Intact   Pulmonary former smoker   breath sounds clear to auscultation + decreased breath sounds      Cardiovascular hypertension, Pt. on medications (-) angina Normal cardiovascular exam Rhythm:Regular Rate:Normal  '16 ECHO: EF 55%, valves OK   Neuro/Psych MS: bedridden, incontinent, unable to swallow well   Neuromuscular disease    GI/Hepatic negative GI ROS, Neg liver ROS,neg GERD  ,,  Endo/Other  diabetesHypothyroidism    Renal/GU ARFRenal disease (stones)     Musculoskeletal   Abdominal  (+) + obese  Peds  Hematology  (+) Blood dyscrasia, anemia   Anesthesia Other Findings   Reproductive/Obstetrics                             Anesthesia Physical Anesthesia Plan  ASA: 3  Anesthesia Plan: General   Post-op Pain Management: Ofirmev IV (intra-op)*   Induction: Intravenous  PONV Risk Score and Plan: 4 or greater and Ondansetron, Dexamethasone and Treatment may vary due to age or medical condition  Airway Management Planned: LMA  Additional Equipment:   Intra-op Plan:   Post-operative Plan: Possible Post-op intubation/ventilation  Informed Consent: I have reviewed the patients History and Physical, chart, labs and discussed the procedure including the risks, benefits and alternatives for the proposed anesthesia with the patient or authorized representative who has indicated his/her understanding and acceptance.     Dental advisory given  Plan Discussed with: CRNA  Anesthesia Plan  Comments:         Anesthesia Quick Evaluation

## 2022-09-17 NOTE — Transfer of Care (Signed)
Immediate Anesthesia Transfer of Care Note  Patient: Jacqueline Norman  Procedure(s) Performed: FIRST STAGE CYSTOSCOPY WITH RETROGRADE PYELOGRAM, URETEROSCOPY AND STENT PLACEMENT (Left) HOLMIUM LASER APPLICATION (Left)  Patient Location: PACU  Anesthesia Type:General  Level of Consciousness: awake, alert , and oriented  Airway & Oxygen Therapy: Patient Spontanous Breathing and Patient connected to face mask oxygen  Post-op Assessment: Report given to RN and Post -op Vital signs reviewed and stable  Post vital signs: Reviewed and stable  Last Vitals:  Vitals Value Taken Time  BP 139/89 09/17/22 1603  Temp    Pulse 81 09/17/22 1603  Resp 14 09/17/22 1603  SpO2 100 % 09/17/22 1603  Vitals shown include unfiled device data.  Last Pain: There were no vitals filed for this visit.       Complications: No notable events documented.

## 2022-09-17 NOTE — Brief Op Note (Signed)
09/17/2022  3:55 PM  PATIENT:  Jacqueline Norman  52 y.o. female  PRE-OPERATIVE DIAGNOSIS:  LEFT RENAL STONES  POST-OPERATIVE DIAGNOSIS:  LEFT RENAL STONES  PROCEDURE:  Procedure(s): FIRST STAGE CYSTOSCOPY WITH RETROGRADE PYELOGRAM, URETEROSCOPY AND STENT PLACEMENT (Left) HOLMIUM LASER APPLICATION (Left)  SURGEON:  Surgeons and Role:    * , Delbert Phenix., MD - Primary  PHYSICIAN ASSISTANT:   ASSISTANTS: none   ANESTHESIA:   general  EBL:  minimal   BLOOD ADMINISTERED:none  DRAINS: none   LOCAL MEDICATIONS USED:  NONE  SPECIMEN:  Source of Specimen:  left renal pelvis urine  DISPOSITION OF SPECIMEN:   microbiology for culture  COUNTS:  YES  TOURNIQUET:  * No tourniquets in log *  DICTATION: .Other Dictation: Dictation Number [id number]  PLAN OF CARE: Discharge to home after PACU  PATIENT DISPOSITION:  PACU - hemodynamically stable.   Delay start of Pharmacological VTE agent (>24hrs) due to surgical blood loss or risk of bleeding: yes

## 2022-09-18 ENCOUNTER — Encounter (HOSPITAL_COMMUNITY): Payer: Self-pay | Admitting: Urology

## 2022-09-24 ENCOUNTER — Encounter (HOSPITAL_COMMUNITY): Payer: Self-pay | Admitting: Urology

## 2022-09-24 ENCOUNTER — Other Ambulatory Visit: Payer: Self-pay | Admitting: Urology

## 2022-09-24 NOTE — Progress Notes (Signed)
Patient's mother phoned to give updated information on surgery.  Date of Surgery - 10-01-22  Arrival Time - 12:15 PM and check in at admitting.    NPO Status - patient's mother advised that patient cannot eat solid food or drink liquids after midnight.   Medications morning of surgery - Levothyroxine  No change in medical history, allergies per patient's mother  Transportation home - [facility]  All questions answered and patient stated understanding

## 2022-10-01 ENCOUNTER — Ambulatory Visit (HOSPITAL_COMMUNITY): Payer: 59 | Admitting: Anesthesiology

## 2022-10-01 ENCOUNTER — Ambulatory Visit (HOSPITAL_COMMUNITY)
Admission: RE | Admit: 2022-10-01 | Discharge: 2022-10-01 | Disposition: A | Discharge status: Home / Self Care | Payer: 59 | Attending: Urology | Admitting: Urology

## 2022-10-01 ENCOUNTER — Ambulatory Visit (HOSPITAL_BASED_OUTPATIENT_CLINIC_OR_DEPARTMENT_OTHER): Payer: 59 | Admitting: Anesthesiology

## 2022-10-01 ENCOUNTER — Encounter (HOSPITAL_COMMUNITY)
Admission: RE | Disposition: A | Discharge status: Home / Self Care | Payer: Self-pay | Source: Home / Self Care | Attending: Urology

## 2022-10-01 ENCOUNTER — Ambulatory Visit (HOSPITAL_COMMUNITY): Payer: 59

## 2022-10-01 ENCOUNTER — Encounter (HOSPITAL_COMMUNITY): Payer: Self-pay | Admitting: Urology

## 2022-10-01 DIAGNOSIS — Z6832 Body mass index (BMI) 32.0-32.9, adult: Secondary | ICD-10-CM | POA: Diagnosis not present

## 2022-10-01 DIAGNOSIS — N3020 Other chronic cystitis without hematuria: Secondary | ICD-10-CM | POA: Insufficient documentation

## 2022-10-01 DIAGNOSIS — E669 Obesity, unspecified: Secondary | ICD-10-CM | POA: Diagnosis not present

## 2022-10-01 DIAGNOSIS — I10 Essential (primary) hypertension: Secondary | ICD-10-CM | POA: Insufficient documentation

## 2022-10-01 DIAGNOSIS — R32 Unspecified urinary incontinence: Secondary | ICD-10-CM | POA: Insufficient documentation

## 2022-10-01 DIAGNOSIS — N200 Calculus of kidney: Secondary | ICD-10-CM | POA: Diagnosis present

## 2022-10-01 DIAGNOSIS — D649 Anemia, unspecified: Secondary | ICD-10-CM | POA: Insufficient documentation

## 2022-10-01 DIAGNOSIS — G35 Multiple sclerosis: Secondary | ICD-10-CM | POA: Diagnosis not present

## 2022-10-01 DIAGNOSIS — Z905 Acquired absence of kidney: Secondary | ICD-10-CM | POA: Insufficient documentation

## 2022-10-01 DIAGNOSIS — R532 Functional quadriplegia: Secondary | ICD-10-CM | POA: Insufficient documentation

## 2022-10-01 DIAGNOSIS — E119 Type 2 diabetes mellitus without complications: Secondary | ICD-10-CM | POA: Diagnosis not present

## 2022-10-01 DIAGNOSIS — Z7401 Bed confinement status: Secondary | ICD-10-CM | POA: Insufficient documentation

## 2022-10-01 DIAGNOSIS — Z87891 Personal history of nicotine dependence: Secondary | ICD-10-CM | POA: Diagnosis not present

## 2022-10-01 DIAGNOSIS — Z01818 Encounter for other preprocedural examination: Secondary | ICD-10-CM

## 2022-10-01 DIAGNOSIS — E039 Hypothyroidism, unspecified: Secondary | ICD-10-CM | POA: Insufficient documentation

## 2022-10-01 HISTORY — DX: Personal history of urinary calculi: Z87.442

## 2022-10-01 HISTORY — PX: CYSTOSCOPY WITH RETROGRADE PYELOGRAM, URETEROSCOPY AND STENT PLACEMENT: SHX5789

## 2022-10-01 HISTORY — PX: HOLMIUM LASER APPLICATION: SHX5852

## 2022-10-01 LAB — GLUCOSE, CAPILLARY
Glucose-Capillary: 67 mg/dL — ABNORMAL LOW (ref 70–99)
Glucose-Capillary: 309 mg/dL — ABNORMAL HIGH (ref 70–99)
Glucose-Capillary: 264 mg/dL — ABNORMAL HIGH (ref 70–99)
Glucose-Capillary: 218 mg/dL — ABNORMAL HIGH (ref 70–99)

## 2022-10-01 SURGERY — CYSTOURETEROSCOPY, WITH RETROGRADE PYELOGRAM AND STENT INSERTION
Anesthesia: General | Laterality: Left

## 2022-10-01 MED ORDER — IOHEXOL 300 MG/ML  SOLN
INTRAMUSCULAR | Status: DC | PRN
  Administered 2022-10-01: 10 mL

## 2022-10-01 MED ORDER — ONDANSETRON HCL 4 MG/2ML IJ SOLN
INTRAMUSCULAR | Status: DC | PRN
  Administered 2022-10-01: 4 mg via INTRAVENOUS

## 2022-10-01 MED ORDER — LACTATED RINGERS IV SOLN: INTRAVENOUS | Status: DC

## 2022-10-01 MED ORDER — MIDAZOLAM HCL 2 MG/2ML IJ SOLN
INTRAMUSCULAR | Status: AC
  Filled 2022-10-01: qty 2

## 2022-10-01 MED ORDER — HYDROCODONE-ACETAMINOPHEN 5-325 MG PO TABS: 1.0000 | ORAL_TABLET | Freq: Four times a day (QID) | ORAL | 0 refills | Status: AC | PRN

## 2022-10-01 MED ORDER — FENTANYL CITRATE (PF) 100 MCG/2ML IJ SOLN
INTRAMUSCULAR | Status: DC | PRN
  Administered 2022-10-01: 50 ug via INTRAVENOUS

## 2022-10-01 MED ORDER — CEPHALEXIN 500 MG PO CAPS: 500.0000 mg | ORAL_CAPSULE | Freq: Two times a day (BID) | ORAL | 0 refills | Status: AC

## 2022-10-01 MED ORDER — DEXTROSE 50 % IV SOLN
50.0000 mL | Freq: Once | INTRAVENOUS | Status: AC
  Administered 2022-10-01: 25 mL via INTRAVENOUS

## 2022-10-01 MED ORDER — SODIUM CHLORIDE 0.9 % IR SOLN
Status: DC | PRN
  Administered 2022-10-01: 3000 mL

## 2022-10-01 MED ORDER — MEPERIDINE HCL 50 MG/ML IJ SOLN: 6.2500 mg | INTRAMUSCULAR | Status: DC | PRN

## 2022-10-01 MED ORDER — PHENYLEPHRINE HCL (PRESSORS) 10 MG/ML IV SOLN
INTRAVENOUS | Status: DC | PRN
  Administered 2022-10-01: 100 ug via INTRAVENOUS

## 2022-10-01 MED ORDER — CHLORHEXIDINE GLUCONATE 0.12 % MT SOLN: 15.0000 mL | Freq: Once | OROMUCOSAL | Status: DC

## 2022-10-01 MED ORDER — OXYCODONE HCL 5 MG PO TABS: 5.0000 mg | ORAL_TABLET | Freq: Once | ORAL | Status: DC | PRN

## 2022-10-01 MED ORDER — HYDROMORPHONE HCL 1 MG/ML IJ SOLN: 0.2500 mg | INTRAMUSCULAR | Status: DC | PRN

## 2022-10-01 MED ORDER — PROPOFOL 10 MG/ML IV BOLUS
INTRAVENOUS | Status: DC | PRN
  Administered 2022-10-01: 200 mg via INTRAVENOUS

## 2022-10-01 MED ORDER — PROPOFOL 10 MG/ML IV BOLUS
INTRAVENOUS | Status: AC
  Filled 2022-10-01: qty 20

## 2022-10-01 MED ORDER — GENTAMICIN SULFATE 40 MG/ML IJ SOLN
360.0000 mg | INTRAVENOUS | Status: AC
  Administered 2022-10-01: 360 mg via INTRAVENOUS
  Filled 2022-10-01: qty 9

## 2022-10-01 MED ORDER — ORAL CARE MOUTH RINSE: 15.0000 mL | Freq: Once | OROMUCOSAL | Status: DC

## 2022-10-01 MED ORDER — 0.9 % SODIUM CHLORIDE (POUR BTL) OPTIME
TOPICAL | Status: DC | PRN
  Administered 2022-10-01: 1000 mL

## 2022-10-01 MED ORDER — PHENYLEPHRINE HCL-NACL 20-0.9 MG/250ML-% IV SOLN
INTRAVENOUS | Status: DC | PRN
  Administered 2022-10-01: 40 ug/min via INTRAVENOUS

## 2022-10-01 MED ORDER — OXYCODONE HCL 5 MG/5ML PO SOLN: 5.0000 mg | Freq: Once | ORAL | Status: DC | PRN

## 2022-10-01 MED ORDER — MIDAZOLAM HCL 5 MG/5ML IJ SOLN
INTRAMUSCULAR | Status: DC | PRN
  Administered 2022-10-01: 2 mg via INTRAVENOUS

## 2022-10-01 MED ORDER — DEXTROSE 50 % IV SOLN
INTRAVENOUS | Status: AC
  Filled 2022-10-01: qty 50

## 2022-10-01 MED ORDER — PROMETHAZINE HCL 25 MG/ML IJ SOLN: 6.2500 mg | INTRAMUSCULAR | Status: DC | PRN

## 2022-10-01 MED ORDER — FENTANYL CITRATE (PF) 100 MCG/2ML IJ SOLN
INTRAMUSCULAR | Status: AC
  Filled 2022-10-01: qty 2

## 2022-10-01 MED ORDER — LIDOCAINE HCL (CARDIAC) PF 100 MG/5ML IV SOSY
PREFILLED_SYRINGE | INTRAVENOUS | Status: DC | PRN
  Administered 2022-10-01: 60 mg via INTRAVENOUS

## 2022-10-01 MED ORDER — AMISULPRIDE (ANTIEMETIC) 5 MG/2ML IV SOLN: 10.0000 mg | Freq: Once | INTRAVENOUS | Status: DC | PRN

## 2022-10-01 SURGICAL SUPPLY — 28 items
BAG URO CATCHER STRL LF (MISCELLANEOUS) ×1 IMPLANT
BASKET LASER NITINOL 1.9FR (BASKET) IMPLANT
BASKET STONE NCOMPASS (UROLOGICAL SUPPLIES) IMPLANT
BSKT STON RTRVL 120 1.9FR (BASKET)
CATH URETL OPEN END 6FR 70 (CATHETERS) ×1 IMPLANT
CLOTH BEACON ORANGE TIMEOUT ST (SAFETY) ×1 IMPLANT
EXTRACTOR STONE 1.7FRX115CM (UROLOGICAL SUPPLIES) IMPLANT
FIBER LASER MOSES 200 DFL (Laser) IMPLANT
GLOVE SURG LX STRL 7.5 STRW (GLOVE) ×1 IMPLANT
GOWN STRL REUS W/ TWL XL LVL3 (GOWN DISPOSABLE) ×1 IMPLANT
GOWN STRL REUS W/TWL XL LVL3 (GOWN DISPOSABLE) ×1
GUIDEWIRE ANG ZIPWIRE 038X150 (WIRE) ×1 IMPLANT
GUIDEWIRE STR DUAL SENSOR (WIRE) ×1 IMPLANT
KIT TURNOVER KIT A (KITS) IMPLANT
LASER FIB FLEXIVA PULSE ID 365 (Laser) IMPLANT
LASER FIB FLEXIVA PULSE ID 550 (Laser) IMPLANT
LASER FIB FLEXIVA PULSE ID 910 (Laser) IMPLANT
MANIFOLD NEPTUNE II (INSTRUMENTS) ×1 IMPLANT
MAT HALF PREVALON HALF STRYKER (MISCELLANEOUS) IMPLANT
PACK CYSTO (CUSTOM PROCEDURE TRAY) ×1 IMPLANT
SHEATH NAVIGATOR HD 11/13X28 (SHEATH) IMPLANT
SHEATH NAVIGATOR HD 11/13X36 (SHEATH) IMPLANT
STENT POLARIS 5FRX24 (STENTS) IMPLANT
TRACTIP FLEXIVA PULS ID 200XHI (Laser) IMPLANT
TRACTIP FLEXIVA PULSE ID 200 (Laser) ×1
TUBE PU 8FR 16IN ENFIT (TUBING) ×1 IMPLANT
TUBING CONNECTING 10 (TUBING) ×1 IMPLANT
TUBING UROLOGY SET (TUBING) ×1 IMPLANT

## 2022-10-01 NOTE — Anesthesia Postprocedure Evaluation (Signed)
Anesthesia Post Note  Patient: Jacqueline Norman  Procedure(s) Performed: SECOND STAGE CYSTOSCOPY WITH RETROGRADE PYELOGRAM, URETEROSCOPY AND STENT EXCHANGE (Left) HOLMIUM LASER APPLICATION (Left)     Patient location during evaluation: PACU Anesthesia Type: General Level of consciousness: awake and alert Pain management: pain level controlled Vital Signs Assessment: post-procedure vital signs reviewed and stable Respiratory status: spontaneous breathing, nonlabored ventilation and respiratory function stable Cardiovascular status: blood pressure returned to baseline and stable Postop Assessment: no apparent nausea or vomiting Anesthetic complications: no   No notable events documented.  Last Vitals:  Vitals:   10/01/22 1700 10/01/22 1715  BP: 123/83 131/87  Pulse: (!) 103 (!) 103  Resp: 15 15  Temp:    SpO2: 100% 95%    Last Pain:  Vitals:   10/01/22 1700  TempSrc:   PainSc: 0-No pain                 Lowella Curb

## 2022-10-01 NOTE — Anesthesia Preprocedure Evaluation (Signed)
Anesthesia Evaluation  Patient identified by MRN, date of birth, ID band Patient awake    Reviewed: Allergy & Precautions, NPO status , Patient's Chart, lab work & pertinent test results  History of Anesthesia Complications Negative for: history of anesthetic complications  Airway Mallampati: III  TM Distance: >3 FB Neck ROM: Full    Dental no notable dental hx. (+) Dental Advisory Given, Teeth Intact   Pulmonary former smoker   breath sounds clear to auscultation + decreased breath sounds      Cardiovascular hypertension, Pt. on medications (-) angina Normal cardiovascular exam Rhythm:Regular Rate:Normal  '16 ECHO: EF 55%, valves OK   Neuro/Psych MS: bedridden, incontinent, unable to swallow well   Neuromuscular disease    GI/Hepatic negative GI ROS, Neg liver ROS,neg GERD  ,,  Endo/Other  diabetesHypothyroidism    Renal/GU ARFRenal disease (stones)     Musculoskeletal   Abdominal  (+) + obese  Peds  Hematology  (+) Blood dyscrasia, anemia   Anesthesia Other Findings   Reproductive/Obstetrics                             Anesthesia Physical Anesthesia Plan  ASA: 3  Anesthesia Plan: General   Post-op Pain Management:    Induction: Intravenous  PONV Risk Score and Plan: 3 and Ondansetron, Dexamethasone, Treatment may vary due to age or medical condition and Midazolam  Airway Management Planned: LMA  Additional Equipment:   Intra-op Plan:   Post-operative Plan: Extubation in OR  Informed Consent: I have reviewed the patients History and Physical, chart, labs and discussed the procedure including the risks, benefits and alternatives for the proposed anesthesia with the patient or authorized representative who has indicated his/her understanding and acceptance.     Dental advisory given  Plan Discussed with: CRNA  Anesthesia Plan Comments:         Anesthesia Quick  Evaluation

## 2022-10-01 NOTE — Op Note (Unsigned)
NAME: DAZIE, SUHRE MEDICAL RECORD NO: [id number] ACCOUNT NO: [id number] DATE OF BIRTH: 1970-10-18 FACILITY: [facility] LOCATION: [facility] PHYSICIAN: Sebastian Ache, MD  Operative Report   PREOPERATIVE DIAGNOSES: 1.  Residual left renal stone, status post first stage procedure. 2.  Solitary left kidney.  PROCEDURES PERFORMED: 1.  Cystoscopy, left retrograde pyelogram interpretation. 2.  Second stage left ureteroscopy with laser lithotripsy and stent exchange.  ESTIMATED BLOOD LOSS:  Nil.  COMPLICATIONS:  None.  SPECIMEN:  Renal stone fragments for discard.  FINDINGS: 1.  Approximately 1 cm total volume residual left lower renal stone across multiple small fragments. 2.  Complete resolution of all accessible stone fragments larger than one-third mm following laser lithotripsy and basket extraction. 3.  Successful replacement of left ureteral stent, proximal end in the renal pelvis, distal end in urinary bladder with tether.  INDICATIONS:  The patient is a 52 year old lady who has significant history of multiple sclerosis that is quite severe.  She has near functional paraplegia as well as a solitary left kidney, having a right nonfunctional kidney removed years ago for  recurrent severe pyelo.  She has a history of urolithiasis as well and is on surveillance protocol.  She was found to have significant volume residual and recurrent left renal stones with some intrarenal dilation on surveillance imaging and counseled  towards management with ureteroscopy.  Given her stone volume, it was understood that this very well may require a staged approach.  She underwent first stage procedure on 08/09 at which point it was felt that the majority of her stone volume was  addressed, however, given solitary kidney and goal of stone free, it was clearly felt that second stage procedure was warranted and she presents for this today.  Informed consent was obtained and placed in medical record.  PROCEDURE  IN DETAIL:  The patient being Brezlyn Troupe verified and procedure being left second stage ureteroscopic stone manipulation was confirmed.  Procedure timeout was performed.  Intravenous antibiotics were administered.  General anesthesia was  induced.  The patient was placed into a low lithotomy position.  Sterile field was created prepping and draping the patient's vagina, introitus, and proximal thighs using iodine.  Cystourethroscopy was performed using 21-French rigid cystoscope with  offset lens.  Inspection of urinary bladder revealed no diverticula, calcifications or papillary lesions.  Distal end of left ureteral stent was seen, grasped, brought to the urethral meatus.  A 0.038 ZIPwire was advanced, exchanged for an open-ended  catheter and left retrograde pyelogram was obtained.  Left retrograde pyelogram demonstrated single left ureter, single system left kidney.  No obvious filling defects or narrowing noted.  There was some chronic mild dilation noted without hydronephrosis.  A ZIPwire was once again advanced and set aside as  a safety wire.  An 8-French feeding tube placed in the urinary bladder for pressure release and semirigid ureteroscopy performed of the distal orifice of the left ureter alongside a separate sensor working wire.  Cystoscope was exchanged for a short  length ureteral access sheath to the level of the proximal ureter using continuous fluoroscopic guidance and flexible digital ureteroscopy was performed of the proximal ureter and systematic inspection of left kidney.  There were three foci of residual  stone, mostly in the mid and lower pole.  Total stone volume estimated to be approximately 1 cm.  These were mostly residual fragments. Using an NCompass type basket, all fragments amenable to basketing were removed and this retrieved approximately 50%  of the residual stone  volume. Remaining fragments were all quite small, approximately 1/3-1/2 mm ____ amenable with basketing  as I essentially would get through the basket wires. Holmium laser energy was then applied to these areas using 1 joule settings  and popcorn technique.  These small fragments were essentially obliterated into dust and sand.  Next, this residual stone was irrigated with a sequential technique of ureteroscopic irrigation and *** suctioning a 50 mL syringe.  This resulted in  resolution of additional 50% of remaining stone burden.  Following this, excellent hemostasis, no evidence of any perforation.  We achieved the goals of removal of all accessible stone fragments larger than one-third mm.  Access sheath was removed under  continuous vision, no significant mucosal abnormalities were found.  Given dusting technique in the solitary kidney, I felt that brief interval stenting with tethered stent would be most prudent.  As such, new 5 x 24 Polaris type stent was carefully  placed using fluoroscopic guidance.  Good proximal and distal planes were noted.  Tether was left in place, trimmed to length, tucked per vagina and the procedure was terminated.  The patient tolerated the procedure well, no immediate periprocedural  complications.  The patient was taken to postanesthesia care in stable condition.  Plan for discharge home and removal of her stent early next week.   NIK D: 10/01/2022 4:44:08 pm T: 10/01/2022 8:36:00 pm  JOB: [id number]/ [id number]

## 2022-10-01 NOTE — Discharge Instructions (Addendum)
1 - You may have urinary urgency (bladder spasms) and bloody urine on / off with stent in place. This is normal.  2 - Remove tethered stent on Tuesday morning at home by pulling on string, then blue-white plastic tubing, and discarding. Office is open Tuesday if any problems arise.   3 - Call MD or go to ER for fever >102, severe pain / nausea / vomiting not relieved by medications, or acute change in medical status

## 2022-10-01 NOTE — Transfer of Care (Signed)
Immediate Anesthesia Transfer of Care Note  Patient: Jacqueline Norman  Procedure(s) Performed: SECOND STAGE CYSTOSCOPY WITH RETROGRADE PYELOGRAM, URETEROSCOPY AND STENT EXCHANGE (Left) HOLMIUM LASER APPLICATION (Left)  Patient Location: PACU  Anesthesia Type:General  Level of Consciousness: awake and alert   Airway & Oxygen Therapy: Patient Spontanous Breathing and Patient connected to face mask oxygen  Post-op Assessment: Report given to RN and Post -op Vital signs reviewed and stable  Post vital signs: Reviewed and stable  Last Vitals:  Vitals Value Taken Time  BP 123/82 10/01/22 1652  Temp    Pulse 100 10/01/22 1654  Resp 15 10/01/22 1654  SpO2 100 % 10/01/22 1654  Vitals shown include unfiled device data.  Last Pain:  Vitals:   10/01/22 1216  TempSrc: Oral  PainSc:          Complications: No notable events documented.

## 2022-10-01 NOTE — H&P (Signed)
Jacqueline Norman is an 52 y.o. female.    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 Most recent Cr <1.5.    2 - Chronic Bacteriuria- many episodeds cystitis, several febrile UTI's. Most recent UCX 07/2015, negative. Previously e. coli sens Vantin. Most recetn UCX 2024 negative.    3 - Large Left Renal Stone - s/p left PCNL 09/2014 for large volume left renal stone in solitary kidney. F/u nephrostogram confirms no residual filling defects.   Recent Surveillance:  11/2017 - Ureteroscopy to stone free for 12mm UPJ stone.  05/2020 - CT - stone free.  07/2022 - [facility], Renal US - multiple renal stoens, approx 2.5cm total.    PMH sig for MS with functional quadraplegia, obesity, chornic anemia (Hgb 7-8 range previously, now improved ). Her PCP is Florentina Jenny with [facility]. Her neurologist is Dr. Anne Hahn.    Today Adam is seen to proceed with LEFT 2nd stage ureteroscopy for recurrent stones in solitary kidney. Most recent UCX negative. A1c 8.7, Hgb 14. She did well peri-op from 1st stage procedure on 09/17/22.   Past Medical History:  Diagnosis Date   Anemia    Diabetes mellitus without complication (HCC)    History of kidney stones    Hypertension    Hypothyroidism    MS (multiple sclerosis) (HCC)    BED BOUND -UP IN W/C USING HOYER LIFT- UNABLE TO MOVE ARMS AND LEGS - NOT ABLE TO FEED SELF- NO TROUBLE SWALLOWING - STATES SHE EATS WELL; INCONTINENT - WEARS DEPENDS   Perinephric abscess    HOSPITALIZED 07/29/13 TO 08/05/13.WITH SEPSIS   Xanthogranulomatous pyelonephritis    RIGHT     Past Surgical History:  Procedure Laterality Date   CYSTOSCOPY W/ URETERAL STENT PLACEMENT Left 11/16/2017   Procedure: CYSTOSCOPY WITH RETROGRADE PYELOGRAM/URETERAL STENT PLACEMENT;  Surgeon: Sebastian Ache, MD;  Location: [facility];  Service:  Urology;  Laterality: Left;   CYSTOSCOPY WITH RETROGRADE PYELOGRAM, URETEROSCOPY AND STENT PLACEMENT Left 12/09/2017   Procedure: CYSTOSCOPY WITH RETROGRADE PYELOGRAM, URETEROSCOPY AND STENT EXCHANGE;  Surgeon: Sebastian Ache, MD;  Location: [facility];  Service: Urology;  Laterality: Left;  90 MINS   CYSTOSCOPY WITH RETROGRADE PYELOGRAM, URETEROSCOPY AND STENT PLACEMENT Left 09/17/2022   Procedure: FIRST STAGE CYSTOSCOPY WITH RETROGRADE PYELOGRAM, URETEROSCOPY AND STENT PLACEMENT;  Surgeon: Loletta Parish., MD;  Location: [facility];  Service: Urology;  Laterality: Left;   HOLMIUM LASER APPLICATION Left 12/09/2017   Procedure: HOLMIUM LASER APPLICATION;  Surgeon: Sebastian Ache, MD;  Location: [facility];  Service: Urology;  Laterality: Left;   HOLMIUM LASER APPLICATION Left 09/17/2022   Procedure: HOLMIUM LASER APPLICATION;  Surgeon: Loletta Parish., MD;  Location: [facility];  Service: Urology;  Laterality: Left;   NEPHROLITHOTOMY Left 09/13/2014   Procedure: NEPHROLITHOTOMY PERCUTANEOUS;  Surgeon: Sebastian Ache, MD;  Location: [facility];  Service: Urology;  Laterality: Left;   NO PAST SURGERIES     ROBOT ASSISTED LAPAROSCOPIC NEPHRECTOMY Right 10/05/2013   Procedure: ROBOTIC ASSISTED LAPAROSCOPIC RADICAL NEPHRECTOMY;  Surgeon: Sebastian Ache, MD;  Location: [facility];  Service: Urology;  Laterality: Right;    No family history on file. Social History:  reports that she quit smoking about 25 years ago. Her smoking use included cigarettes. She has never used smokeless tobacco. She reports that she does not drink alcohol and does not use drugs.  Allergies: No Known Allergies  No medications prior to admission.    No results found for this or any previous visit (from the past 48 hour(s)). No results found.  Review of Systems  Constitutional:  Negative for chills and fever.  All other systems reviewed and are negative.   There were no vitals taken for this visit. Physical Exam Vitals reviewed.   Constitutional:      Comments: Stable stigmata of neurologic disease, pleasant.   Eyes:     Pupils: Pupils are equal, round, and reactive to light.  Cardiovascular:     Rate and Rhythm: Normal rate.  Pulmonary:     Effort: Pulmonary effort is normal.  Abdominal:     Comments: Stable truncla obesity, prior scars w/o hernias.   Genitourinary:    Comments: No CVAT at present Musculoskeletal:     Cervical back: Normal range of motion.  Skin:    General: Skin is warm.  Neurological:     General: No focal deficit present.     Mental Status: She is alert.      Assessment/Plan  Proceed as planned with LEFT 2nd stage ureteroscopic stone manipulation. Risks, benefits, alternatives, expected peri-op course discussed previously and reiterated to pt and mother.   Loletta Parish., MD 10/01/2022, 5:57 AM

## 2022-10-01 NOTE — Brief Op Note (Signed)
10/01/2022  4:36 PM  PATIENT:  Jacqueline Norman  52 y.o. female  PRE-OPERATIVE DIAGNOSIS:  LEFT RENAL STONES  POST-OPERATIVE DIAGNOSIS:  LEFT RENAL STONES  PROCEDURE:  Procedure(s): SECOND STAGE CYSTOSCOPY WITH RETROGRADE PYELOGRAM, URETEROSCOPY AND STENT EXCHANGE (Left) HOLMIUM LASER APPLICATION (Left)  SURGEON:  Surgeons and Role:    * Merrie Epler, Delbert Phenix., MD - Primary  PHYSICIAN ASSISTANT:   ASSISTANTS: none   ANESTHESIA:   general  EBL:  0 mL   BLOOD ADMINISTERED:none  DRAINS: none   LOCAL MEDICATIONS USED:  NONE  SPECIMEN:  Source of Specimen:  left renal stone fragments  DISPOSITION OF SPECIMEN:   discard  COUNTS:  YES  TOURNIQUET:  * No tourniquets in log *  DICTATION: .Other Dictation: Dictation Number [id number]  PLAN OF CARE: Discharge to home after PACU  PATIENT DISPOSITION:  PACU - hemodynamically stable.   Delay start of Pharmacological VTE agent (>24hrs) due to surgical blood loss or risk of bleeding: yes

## 2022-10-01 NOTE — Anesthesia Procedure Notes (Signed)
Procedure Name: LMA Insertion Date/Time: 10/01/2022 3:13 PM  Performed by: Johnette Abraham, CRNAPre-anesthesia Checklist: Patient identified, Emergency Drugs available, Suction available and Patient being monitored Patient Re-evaluated:Patient Re-evaluated prior to induction Oxygen Delivery Method: Circle System Utilized Preoxygenation: Pre-oxygenation with 100% oxygen Induction Type: IV induction Ventilation: Mask ventilation without difficulty LMA: LMA inserted LMA Size: 4.0 Number of attempts: 1 Airway Equipment and Method: Bite block Placement Confirmation: positive ETCO2 Tube secured with: Tape Dental Injury: Teeth and Oropharynx as per pre-operative assessment

## 2022-10-02 ENCOUNTER — Encounter (HOSPITAL_COMMUNITY): Payer: Self-pay | Admitting: Urology
